# Patient Record
Sex: Male | Born: 1940 | Race: Black or African American | Hispanic: No | Marital: Single | Smoking: Former smoker
Health system: Southern US, Community
[De-identification: ages and names within clinical notes are randomized; demographics above are authoritative.]

## PROBLEM LIST (undated history)

## (undated) DIAGNOSIS — R918 Other nonspecific abnormal finding of lung field: Secondary | ICD-10-CM

## (undated) DIAGNOSIS — K219 Gastro-esophageal reflux disease without esophagitis: Secondary | ICD-10-CM

## (undated) DIAGNOSIS — E785 Hyperlipidemia, unspecified: Secondary | ICD-10-CM

## (undated) DIAGNOSIS — IMO0001 Reserved for inherently not codable concepts without codable children: Secondary | ICD-10-CM

## (undated) DIAGNOSIS — H269 Unspecified cataract: Secondary | ICD-10-CM

## (undated) DIAGNOSIS — J189 Pneumonia, unspecified organism: Secondary | ICD-10-CM

## (undated) DIAGNOSIS — H9190 Unspecified hearing loss, unspecified ear: Secondary | ICD-10-CM

## (undated) DIAGNOSIS — C801 Malignant (primary) neoplasm, unspecified: Secondary | ICD-10-CM

## (undated) DIAGNOSIS — I10 Essential (primary) hypertension: Secondary | ICD-10-CM

## (undated) HISTORY — DX: Hypercalcemia: E83.52

## (undated) HISTORY — PX: CORONARY ARTERY BYPASS GRAFT: SHX141

## (undated) HISTORY — PX: EYE SURGERY: SHX253

## (undated) HISTORY — PX: CARDIAC CATHETERIZATION: SHX172

## (undated) HISTORY — PX: BACK SURGERY: SHX140

---

## 2005-07-27 ENCOUNTER — Ambulatory Visit: Payer: Self-pay | Admitting: *Deleted

## 2006-02-23 ENCOUNTER — Ambulatory Visit: Payer: Self-pay | Admitting: Pediatrics

## 2006-03-24 ENCOUNTER — Ambulatory Visit: Payer: Self-pay | Admitting: Ophthalmology

## 2006-03-30 ENCOUNTER — Ambulatory Visit: Payer: Self-pay | Admitting: Ophthalmology

## 2006-10-21 ENCOUNTER — Ambulatory Visit: Payer: Self-pay | Admitting: Family Medicine

## 2007-03-02 HISTORY — PX: COLONOSCOPY: SHX174

## 2007-04-25 ENCOUNTER — Ambulatory Visit: Payer: Self-pay | Admitting: General Surgery

## 2007-06-26 ENCOUNTER — Ambulatory Visit: Payer: Self-pay | Admitting: General Surgery

## 2007-06-27 ENCOUNTER — Ambulatory Visit: Payer: Self-pay | Admitting: General Surgery

## 2007-11-29 ENCOUNTER — Ambulatory Visit: Payer: Self-pay | Admitting: Family Medicine

## 2008-01-24 ENCOUNTER — Ambulatory Visit: Payer: Self-pay | Admitting: Unknown Physician Specialty

## 2008-02-02 ENCOUNTER — Ambulatory Visit: Payer: Self-pay | Admitting: Unknown Physician Specialty

## 2008-04-23 ENCOUNTER — Ambulatory Visit: Payer: Self-pay | Admitting: Family Medicine

## 2008-07-31 ENCOUNTER — Ambulatory Visit: Payer: Self-pay | Admitting: Family Medicine

## 2009-10-22 ENCOUNTER — Ambulatory Visit: Payer: Self-pay | Admitting: Internal Medicine

## 2010-02-18 ENCOUNTER — Ambulatory Visit: Payer: Self-pay | Admitting: Urology

## 2010-03-11 ENCOUNTER — Ambulatory Visit: Payer: Self-pay | Admitting: Urology

## 2010-05-19 ENCOUNTER — Ambulatory Visit: Payer: Self-pay | Admitting: Family Medicine

## 2010-06-10 ENCOUNTER — Ambulatory Visit: Payer: Self-pay | Admitting: Oncology

## 2010-06-30 ENCOUNTER — Ambulatory Visit: Payer: Self-pay | Admitting: Oncology

## 2011-07-19 ENCOUNTER — Ambulatory Visit: Payer: Self-pay | Admitting: Family Medicine

## 2011-11-22 ENCOUNTER — Encounter: Payer: Self-pay | Admitting: Neurology

## 2011-11-23 ENCOUNTER — Ambulatory Visit: Payer: Self-pay | Admitting: Neurology

## 2011-11-30 ENCOUNTER — Encounter: Payer: Self-pay | Admitting: Neurology

## 2012-03-13 ENCOUNTER — Ambulatory Visit: Payer: Self-pay | Admitting: Oncology

## 2012-03-16 ENCOUNTER — Ambulatory Visit: Payer: Self-pay | Admitting: Specialist

## 2012-03-17 ENCOUNTER — Ambulatory Visit: Payer: Self-pay | Admitting: Oncology

## 2012-03-31 LAB — COMPREHENSIVE METABOLIC PANEL
Alkaline Phosphatase: 96 U/L (ref 50–136)
Anion Gap: 7 (ref 7–16)
BUN: 15 mg/dL (ref 7–18)
Bilirubin,Total: 0.8 mg/dL (ref 0.2–1.0)
Calcium, Total: 9.1 mg/dL (ref 8.5–10.1)
Chloride: 106 mmol/L (ref 98–107)
Co2: 28 mmol/L (ref 21–32)
Creatinine: 1.52 mg/dL — ABNORMAL HIGH (ref 0.60–1.30)
EGFR (African American): 53 — ABNORMAL LOW
Glucose: 59 mg/dL — ABNORMAL LOW (ref 65–99)
Potassium: 4.1 mmol/L (ref 3.5–5.1)
SGOT(AST): 16 U/L (ref 15–37)
SGPT (ALT): 15 U/L (ref 12–78)

## 2012-03-31 LAB — CBC CANCER CENTER
Basophil #: 0 x10 3/mm (ref 0.0–0.1)
Eosinophil %: 2.2 %
HCT: 41.7 % (ref 40.0–52.0)
HGB: 13.3 g/dL (ref 13.0–18.0)
MCH: 23.8 pg — ABNORMAL LOW (ref 26.0–34.0)
MCV: 74 fL — ABNORMAL LOW (ref 80–100)
Monocyte %: 9.8 %
RDW: 14.8 % — ABNORMAL HIGH (ref 11.5–14.5)

## 2012-04-01 ENCOUNTER — Ambulatory Visit: Payer: Self-pay | Admitting: Oncology

## 2012-04-01 LAB — PSA: PSA: <0.1 ng/mL (ref 0.0–4.0)

## 2012-04-04 ENCOUNTER — Ambulatory Visit: Payer: Self-pay | Admitting: Oncology

## 2012-04-07 ENCOUNTER — Ambulatory Visit: Payer: Self-pay | Admitting: Oncology

## 2012-04-07 LAB — CANCER CENTER HEMATOCRIT: HCT: 41.5 % (ref 40.0–52.0)

## 2012-04-07 LAB — APTT: Activated PTT: 30.9 secs (ref 23.6–35.9)

## 2012-04-07 LAB — PROTIME-INR
INR: 1
Prothrombin Time: 13.8 secs (ref 11.5–14.7)

## 2012-04-26 ENCOUNTER — Ambulatory Visit: Payer: Self-pay | Admitting: Internal Medicine

## 2012-04-27 LAB — PATHOLOGY REPORT

## 2012-04-29 ENCOUNTER — Ambulatory Visit: Payer: Self-pay | Admitting: Oncology

## 2012-05-30 ENCOUNTER — Ambulatory Visit: Payer: Self-pay | Admitting: Oncology

## 2012-06-08 ENCOUNTER — Ambulatory Visit: Payer: Self-pay | Admitting: General Surgery

## 2012-06-28 ENCOUNTER — Encounter: Payer: Self-pay | Admitting: *Deleted

## 2012-12-08 ENCOUNTER — Ambulatory Visit: Payer: Self-pay | Admitting: Specialist

## 2013-01-05 ENCOUNTER — Ambulatory Visit: Payer: Self-pay

## 2013-04-24 ENCOUNTER — Ambulatory Visit: Payer: Self-pay | Admitting: Gastroenterology

## 2013-05-22 ENCOUNTER — Ambulatory Visit: Payer: Self-pay | Admitting: Specialist

## 2013-05-24 ENCOUNTER — Ambulatory Visit: Payer: Self-pay | Admitting: Gastroenterology

## 2013-05-28 LAB — PATHOLOGY REPORT

## 2013-10-24 DIAGNOSIS — J8410 Pulmonary fibrosis, unspecified: Secondary | ICD-10-CM | POA: Insufficient documentation

## 2013-10-24 DIAGNOSIS — J984 Other disorders of lung: Secondary | ICD-10-CM | POA: Insufficient documentation

## 2013-10-24 DIAGNOSIS — J449 Chronic obstructive pulmonary disease, unspecified: Secondary | ICD-10-CM | POA: Insufficient documentation

## 2013-11-03 ENCOUNTER — Ambulatory Visit: Payer: Self-pay | Admitting: Family Medicine

## 2013-12-08 ENCOUNTER — Ambulatory Visit: Payer: Self-pay | Admitting: Family Medicine

## 2014-04-03 ENCOUNTER — Ambulatory Visit: Payer: Self-pay | Admitting: Urology

## 2014-07-08 DIAGNOSIS — Z961 Presence of intraocular lens: Secondary | ICD-10-CM | POA: Insufficient documentation

## 2014-07-08 DIAGNOSIS — I712 Thoracic aortic aneurysm, without rupture, unspecified: Secondary | ICD-10-CM | POA: Insufficient documentation

## 2014-07-08 DIAGNOSIS — M5136 Other intervertebral disc degeneration, lumbar region: Secondary | ICD-10-CM | POA: Insufficient documentation

## 2014-07-08 DIAGNOSIS — I2510 Atherosclerotic heart disease of native coronary artery without angina pectoris: Secondary | ICD-10-CM | POA: Insufficient documentation

## 2014-07-08 DIAGNOSIS — E162 Hypoglycemia, unspecified: Secondary | ICD-10-CM | POA: Insufficient documentation

## 2014-07-08 DIAGNOSIS — J189 Pneumonia, unspecified organism: Secondary | ICD-10-CM | POA: Insufficient documentation

## 2014-07-08 DIAGNOSIS — Z720 Tobacco use: Secondary | ICD-10-CM | POA: Insufficient documentation

## 2014-07-08 DIAGNOSIS — R918 Other nonspecific abnormal finding of lung field: Secondary | ICD-10-CM | POA: Insufficient documentation

## 2014-07-08 DIAGNOSIS — I440 Atrioventricular block, first degree: Secondary | ICD-10-CM | POA: Insufficient documentation

## 2014-07-17 DIAGNOSIS — Z8701 Personal history of pneumonia (recurrent): Secondary | ICD-10-CM | POA: Insufficient documentation

## 2014-07-24 ENCOUNTER — Other Ambulatory Visit: Payer: Self-pay | Admitting: Specialist

## 2014-07-24 DIAGNOSIS — R918 Other nonspecific abnormal finding of lung field: Secondary | ICD-10-CM

## 2014-08-01 ENCOUNTER — Ambulatory Visit
Admission: RE | Admit: 2014-08-01 | Discharge: 2014-08-01 | Disposition: A | Discharge status: Home / Self Care | Payer: Medicare Other | Source: Ambulatory Visit | Attending: Specialist | Admitting: Specialist

## 2014-08-01 DIAGNOSIS — R599 Enlarged lymph nodes, unspecified: Secondary | ICD-10-CM | POA: Insufficient documentation

## 2014-08-01 DIAGNOSIS — R918 Other nonspecific abnormal finding of lung field: Secondary | ICD-10-CM | POA: Diagnosis present

## 2014-08-01 DIAGNOSIS — J9811 Atelectasis: Secondary | ICD-10-CM | POA: Diagnosis not present

## 2014-08-01 DIAGNOSIS — N281 Cyst of kidney, acquired: Secondary | ICD-10-CM | POA: Insufficient documentation

## 2014-08-01 DIAGNOSIS — I700 Atherosclerosis of aorta: Secondary | ICD-10-CM | POA: Diagnosis not present

## 2014-08-01 HISTORY — DX: Essential (primary) hypertension: I10

## 2014-08-01 MED ORDER — IOHEXOL 350 MG/ML SOLN
75.0000 mL | Freq: Once | INTRAVENOUS | Status: AC | PRN
  Administered 2014-08-01: 75 mL via INTRAVENOUS

## 2014-08-19 ENCOUNTER — Other Ambulatory Visit: Payer: Self-pay | Admitting: Family Medicine

## 2014-08-19 DIAGNOSIS — N281 Cyst of kidney, acquired: Secondary | ICD-10-CM

## 2014-09-23 ENCOUNTER — Ambulatory Visit
Admission: RE | Admit: 2014-09-23 | Discharge: 2014-09-23 | Disposition: A | Discharge status: Home / Self Care | Payer: Medicare Other | Source: Ambulatory Visit | Attending: Urology | Admitting: Urology

## 2014-09-23 DIAGNOSIS — N281 Cyst of kidney, acquired: Secondary | ICD-10-CM | POA: Diagnosis not present

## 2014-10-08 ENCOUNTER — Other Ambulatory Visit: Payer: Self-pay | Admitting: Specialist

## 2014-10-08 ENCOUNTER — Encounter: Payer: Self-pay | Admitting: Urology

## 2014-10-08 ENCOUNTER — Ambulatory Visit: Payer: Self-pay | Admitting: Urology

## 2014-10-08 DIAGNOSIS — R911 Solitary pulmonary nodule: Secondary | ICD-10-CM

## 2014-10-10 ENCOUNTER — Encounter: Payer: Self-pay | Admitting: Urology

## 2014-10-16 ENCOUNTER — Encounter: Payer: Self-pay | Admitting: Urology

## 2014-10-17 ENCOUNTER — Ambulatory Visit
Admission: EM | Admit: 2014-10-17 | Discharge: 2014-10-17 | Disposition: A | Discharge status: Home / Self Care | Payer: Medicare Other | Attending: Emergency Medicine | Admitting: Emergency Medicine

## 2014-10-17 ENCOUNTER — Ambulatory Visit: Payer: Medicare Other

## 2014-10-17 DIAGNOSIS — K219 Gastro-esophageal reflux disease without esophagitis: Secondary | ICD-10-CM | POA: Diagnosis not present

## 2014-10-17 DIAGNOSIS — B070 Plantar wart: Secondary | ICD-10-CM | POA: Insufficient documentation

## 2014-10-17 DIAGNOSIS — M79671 Pain in right foot: Secondary | ICD-10-CM | POA: Diagnosis present

## 2014-10-17 DIAGNOSIS — I10 Essential (primary) hypertension: Secondary | ICD-10-CM | POA: Diagnosis not present

## 2014-10-17 DIAGNOSIS — Z7982 Long term (current) use of aspirin: Secondary | ICD-10-CM | POA: Diagnosis not present

## 2014-10-17 HISTORY — DX: Other nonspecific abnormal finding of lung field: R91.8

## 2014-10-17 HISTORY — DX: Gastro-esophageal reflux disease without esophagitis: K21.9

## 2014-10-17 HISTORY — DX: Unspecified cataract: H26.9

## 2014-10-17 NOTE — Discharge Instructions (Signed)
Plantar Warts Warts are benign (noncancerous) growths of the outer skin layer. They can occur at any time in life but are most common during childhood and the teen years. Warts can occur on many skin surfaces of the body. When they occur on the underside (sole) of your foot they are called plantar warts. They often emerge in groups with several small warts encircling a larger growth. CAUSES  Human papillomavirus (HPV) is the cause of plantar warts. HPV attacks a break in the skin of the foot. Walking barefoot can lead to exposure to the wart virus. Plantar warts tend to develop over areas of pressure such as the heel and ball of the foot. Plantar warts often grow into the deeper layers of skin. They may spread to other areas of the sole but cannot spread to other areas of the body. SYMPTOMS  You may also notice a growth on the undersurface of your foot. The wart may grow directly into the sole of the foot, or rise above the surface of the skin on the sole of the foot, or both. They are most often flat from pressure. Warts generally do not cause itching but may cause pain in the area of the wart when you put weight on your foot. DIAGNOSIS  Diagnosis is made by physical examination. This means your caregiver discovers it while examining your foot.  TREATMENT  There are many ways to treat plantar warts. However, warts are very tough. Sometimes it is difficult to treat them so that they go away completely and do not grow back. Any treatment must be done regularly to work. If left untreated, most plantar warts will eventually disappear over a period of one to two years. Treatments you can do at home include:  Putting duct tape over the top of the wart (occlusion) has been found to be effective over several months. The duct tape should be removed each night and reapplied until the wart has disappeared.  Placing over-the-counter medications on top of the wart to help kill the wart virus and remove the wart  tissue (salicylic acid, cantharidin, and dichloroacetic acid) are useful. These are called keratolytic agents. These medications make the skin soft and gradually layers will shed away. These compounds are usually placed on the wart each night and then covered with a bandage. They are also available in premedicated bandage form. Avoid surrounding skin when applying these liquids as these medications can burn healthy skin. The treatment may take several months of nightly use to be effective.  Cryotherapy to freeze the wart has recently become available over-the-counter for children 4 years and older. This system makes use of a soft narrow applicator connected to a bottle of compressed cold liquid that is applied directly to the wart. This medication can burn healthy skin and should be used with caution.  As with all over-the-counter medications, read the directions carefully before use. Treatments generally done in your caregiver's office include:  Some aggressive treatments may cause discomfort, discoloration, and scarring of the surrounding skin. The risks and benefits of treatment should be discussed with your caregiver.  Freezing the wart with liquid nitrogen (cryotherapy, see above).  Burning the wart with use of very high heat (cautery).  Injecting medication into the wart.  Surgically removing or laser treatment of the wart.  Your caregiver may refer you to a dermatologist for difficult to treat large-sized warts or large numbers of warts. HOME CARE INSTRUCTIONS   Soak the affected area in warm water. Dry the   area completely when you are done. Remove the top layer of softened skin, then apply the chosen topical medication and reapply a bandage.  Remove the bandage daily and file excess wart tissue (pumice stone works well for this purpose). Repeat the entire process daily or every other day for weeks until the plantar wart disappears.  Several brands of salicylic acid pads are available  as over-the-counter remedies.  Pain can be relieved by wearing a donut bandage. This is a bandage with a hole in it. The bandage is put on with the hole over the wart. This helps take the pressure off the wart and gives pain relief. To help prevent plantar warts:  Wear shoes and socks and change them daily.  Keep feet clean and dry.  Check your feet and your children's feet regularly.  Avoid direct contact with warts on other people.  Have growths or changes on your skin checked by your caregiver. Document Released: 05/08/2003 Document Revised: 07/02/2013 Document Reviewed: 10/16/2008 Exit[employer] Patient Information 2015 [employer]. This information is not intended to replace advice given to you by your health care provider. Make sure you discuss any questions you have with your health care provider.  

## 2014-10-17 NOTE — ED Notes (Signed)
States has had "something in my right foot for over a month. I keep picking at it but there's still something there". Appears to be a pea sized corn bottom of right great toe

## 2014-10-17 NOTE — ED Provider Notes (Signed)
CSN: [id number]     Arrival date & time 10/17/14  1244 History   First MD Initiated Contact with Patient 10/17/14 1313     Chief Complaint  Patient presents with  . Foot Pain   (Consider location/radiation/quality/duration/timing/severity/associated sxs/prior Treatment) HPI 74 yo M presents concerned that something is in his right foot- Sharp sensation for over a month base great toe-  went to PCP and he reports she wouldn't trim it unless she knew there was not " something in there" He has trimmed and seen black dots. Sharp sensation when he steps on it-ball of right foot at right great toe  Past Medical History  Diagnosis Date  . Hypertension   . GERD (gastroesophageal reflux disease)   . Pulmonary nodules/lesions, multiple   . Cataract    Past Surgical History  Procedure Laterality Date  . Back surgery      discectomy ,reports right foot drop since surgery    History reviewed. No pertinent family history. Social History  Substance Use Topics  . Smoking status: Former Research scientist (life sciences)  . Smokeless tobacco: None  . Alcohol Use: No    Review of Systems Review of 10 systems negative for acute change except as referenced in HPI  .  Denies other acute issues at this time Allergies  Review of patient's allergies indicates no known allergies.  Home Medications   Prior to Admission medications   Medication Sig Start Date End Date Taking? Authorizing Provider  aspirin EC 81 MG tablet Take 81 mg by mouth daily.   Yes Historical Provider, MD  famotidine (PEPCID) 20 MG tablet Take 20 mg by mouth 2 (two) times daily.   Yes Historical Provider, MD   BP 112/95 mmHg  Pulse 69  Temp(Src) 97.1 F (36.2 C) (Oral)  Resp 16  Ht 6' (1.829 m)  Wt 184 lb (83.462 kg)  BMI 24.95 kg/m2  SpO2 97% Physical Exam   Constitutional -alert and oriented,well appearing and in no acute distress Head-atraumatic, normocephalic Eyes- conjunctiva normal, EOMI ,conjugate gaze Ears- hard of hearing,canals  clear, TMs neg Nose- no congestion or rhinorrhea Mouth/throat- mucous membranes moist , Neck- supple  CV- regular rate, grossly normal heart sounds,  Resp-no distress, normal respiratory effort, GI-no distention GU- deferred MSK- non tender, normal ROM, all extremities, ambulatory, self-care- tenderness sole base  right great toe , callous vs wart Neuro- normal speech and language, no gross focal neurological deficit appreciated, no gait instability, Skin-warm,dry ,intact; no rash noted Psych-mood and affect grossly normal; speech and behavior grossly normal  ED Course  Procedures (including critical care time) Labs Review Labs Reviewed - No data to display  Imaging Review Dg Toe Great Right  10/17/2014   CLINICAL DATA:  The patient feels as if something is stuck in his foot the first MTP joint. No known injury. Initial encounter.  EXAM: RIGHT GREAT TOE  COMPARISON:  None.  FINDINGS: There appears to be mild soft tissue swelling about the medial margin of the first metatarsal head. No radiopaque foreign body or soft tissue gas collection is identified. Image bones and joints appear normal.  IMPRESSION: Mild soft tissue swelling about the first metatarsal head without underlying foreign body or bony abnormality.   Electronically Signed   By: Inge Rise M.D.   On: 10/17/2014 14:02   Suspected plantar wart was steri-prepped then shaved with scalpel. No anesthesia needed.  Classic central core revealed and de-bulked. Neosporin and band-aid dressing. Patient pleased with comfort post procedure. Self-care taught  Can seek podiatry care whenever preferred. Info given    MDM   1. Plantar wart, right foot    Plan: 1. Test/x-ray results and diagnosis reviewed with patient 2. rx as per orders; risks, benefits, potential side effects reviewed with patient 3. Recommend supportive treatment with warm soaks , safety razor shave trims after showers/baths-patient taught.     Duct tape  technique and salacyclic acid pad therapy reviewed as options-hand out given 4. F/u prn if symptoms worsen or don't improve, podiatry consult if desired    Jan Fireman, PA-C 10/18/14 1643

## 2014-10-18 ENCOUNTER — Encounter: Payer: Self-pay | Admitting: Physician Assistant

## 2014-11-05 ENCOUNTER — Ambulatory Visit
Admission: RE | Admit: 2014-11-05 | Discharge: 2014-11-05 | Disposition: A | Discharge status: Home / Self Care | Payer: Medicare Other | Source: Ambulatory Visit | Attending: Specialist | Admitting: Specialist

## 2014-11-05 DIAGNOSIS — N281 Cyst of kidney, acquired: Secondary | ICD-10-CM | POA: Diagnosis not present

## 2014-11-05 DIAGNOSIS — J439 Emphysema, unspecified: Secondary | ICD-10-CM | POA: Diagnosis not present

## 2014-11-05 DIAGNOSIS — R911 Solitary pulmonary nodule: Secondary | ICD-10-CM | POA: Insufficient documentation

## 2014-11-05 DIAGNOSIS — I700 Atherosclerosis of aorta: Secondary | ICD-10-CM | POA: Diagnosis not present

## 2014-11-05 MED ORDER — IOHEXOL 300 MG/ML  SOLN
75.0000 mL | Freq: Once | INTRAMUSCULAR | Status: AC | PRN
  Administered 2014-11-05: 60 mL via INTRAVENOUS

## 2015-03-06 ENCOUNTER — Ambulatory Visit
Admission: EM | Admit: 2015-03-06 | Discharge: 2015-03-06 | Disposition: A | Discharge status: Home / Self Care | Payer: Medicare Other | Attending: Family Medicine | Admitting: Family Medicine

## 2015-03-06 DIAGNOSIS — H1012 Acute atopic conjunctivitis, left eye: Secondary | ICD-10-CM

## 2015-03-06 DIAGNOSIS — I714 Abdominal aortic aneurysm, without rupture, unspecified: Secondary | ICD-10-CM | POA: Insufficient documentation

## 2015-03-06 DIAGNOSIS — I10 Essential (primary) hypertension: Secondary | ICD-10-CM | POA: Insufficient documentation

## 2015-03-06 DIAGNOSIS — I6523 Occlusion and stenosis of bilateral carotid arteries: Secondary | ICD-10-CM | POA: Insufficient documentation

## 2015-03-06 DIAGNOSIS — E785 Hyperlipidemia, unspecified: Secondary | ICD-10-CM | POA: Insufficient documentation

## 2015-03-06 DIAGNOSIS — H547 Unspecified visual loss: Secondary | ICD-10-CM

## 2015-03-06 NOTE — ED Notes (Signed)
Went to [facility] ER 2 weeks ago with hypertensive crisis. The next day, left eye began itching. Went to [facility] ER Monday and given Polymixin Ophthamic. States left eye "much worse and has a film over my eye"

## 2015-03-06 NOTE — ED Provider Notes (Signed)
CSN: [id number]     Arrival date & time 03/06/15  1013 History   First MD Initiated Contact with Patient 03/06/15 1214     Chief Complaint  Patient presents with  . Eye Problem   (Consider location/radiation/quality/duration/timing/severity/associated sxs/prior Treatment) HPI  75 year old male who presents with left eye redness and drainage foreign body sensation but no photophobia. He went to [facility] about 2 weeks ago for hypertensive crisis. He stated that his left eye began to itch. He went to [facility] emergency room on Monday prior prescribe polymyxin B ophthalmic for pinkeye. He has been using it as prescribed but has only become worse. Now as feels as if there was something in his eye is I a stinging matted shut in the morning and has a burning sensation. Visual acuity today shows the left eye to be 20/200. It appears to be slightly swollen the conjunctiva is very edematous and erythematous. The patient does admit that he has been rubbing his eye frequently and may be having a reaction to the polymyxin.   Past Medical History  Diagnosis Date  . Hypertension   . GERD (gastroesophageal reflux disease)   . Pulmonary nodules/lesions, multiple   . Cataract    Past Surgical History  Procedure Laterality Date  . Back surgery      discectomy ,reports right foot drop since surgery    History reviewed. No pertinent family history. Social History  Substance Use Topics  . Smoking status: Former Research scientist (life sciences)  . Smokeless tobacco: None  . Alcohol Use: No    Review of Systems  Eyes: Positive for pain, discharge, redness, itching and visual disturbance.  All other systems reviewed and are negative.   Allergies  Review of patient's allergies indicates no known allergies.  Home Medications   Prior to Admission medications   Medication Sig Start Date End Date Taking? Authorizing Provider  aspirin EC 81 MG tablet Take 81 mg by mouth daily.   Yes Historical Provider, MD  atorvastatin  (LIPITOR) 20 MG tablet Take 20 mg by mouth daily.   Yes Historical Provider, MD  furosemide (LASIX) 20 MG tablet Take 20 mg by mouth.   Yes Historical Provider, MD  losartan (COZAAR) 50 MG tablet Take 50 mg by mouth daily.   Yes Historical Provider, MD  omeprazole (PRILOSEC) 20 MG capsule Take 20 mg by mouth daily.   Yes Historical Provider, MD  tamsulosin (FLOMAX) 0.4 MG CAPS capsule Take 0.4 mg by mouth.   Yes Historical Provider, MD  vitamin B-12 (CYANOCOBALAMIN) 500 MCG tablet Take 500 mcg by mouth daily.   Yes Historical Provider, MD  famotidine (PEPCID) 20 MG tablet Take 20 mg by mouth 2 (two) times daily.    Historical Provider, MD   Meds Ordered and Administered this Visit  Medications - No data to display  BP 127/98 mmHg  Pulse 94  Temp(Src) 96.8 F (36 C) (Tympanic)  Resp 17  Ht 6' (1.829 m)  Wt 184 lb (83.462 kg)  BMI 24.95 kg/m2  SpO2 100% No data found.   Physical Exam  Constitutional: He is oriented to person, place, and time. He appears well-developed and well-nourished. No distress.  HENT:  Head: Normocephalic and atraumatic.  Eyes: Pupils are equal, round, and reactive to light.  Examination of the eyes shows a thick clear discharge throughout the eye that they continues to run about the exam. Is no matting of the lashes but they are very wet. The pupil is equal and reactive. EOMs are  intact. Conjunctiva is very boggy and erythematous. He does not exhibit any photophobia. The left lid was everted and no foreign body was seen. Fluorescein staining was deferred to ophthalmology where the patient will be sent.  Neck: Normal range of motion. Neck supple.  Musculoskeletal: Normal range of motion. He exhibits no edema or tenderness.  Neurological: He is alert and oriented to person, place, and time.  Skin: Skin is warm and dry. He is not diaphoretic.  Psychiatric: He has a normal mood and affect. His behavior is normal. Judgment and thought content normal.  Nursing note  and vitals reviewed.   ED Course  Procedures (including critical care time)  Labs Review Labs Reviewed - No data to display  Imaging Review No results found.   Visual Acuity Review  Right Eye Distance: 20/50 Left Eye Distance: 20/200 Bilateral Distance:    Right Eye Near:   Left Eye Near:    Bilateral Near:         MDM   1. Allergic conjunctivitis, left eye   2. Reduced visual acuity    Refer to [facility] because of his decreased visual acuity in that eye and the possibility of an allergic reaction to the polymyxin. We have set up an appointment for him today at  2:45 at [facility].    Lorin Picket, PA-C 03/06/15 1248

## 2015-04-17 ENCOUNTER — Encounter: Payer: Self-pay | Admitting: *Deleted

## 2015-04-22 ENCOUNTER — Ambulatory Visit
Admission: RE | Admit: 2015-04-22 | Discharge: 2015-04-22 | Disposition: A | Discharge status: Home / Self Care | Payer: Medicare Other | Source: Ambulatory Visit | Attending: Ophthalmology | Admitting: Ophthalmology

## 2015-04-22 ENCOUNTER — Encounter: Payer: Self-pay | Admitting: *Deleted

## 2015-04-22 ENCOUNTER — Ambulatory Visit: Payer: Medicare Other | Admitting: Anesthesiology

## 2015-04-22 ENCOUNTER — Encounter
Admission: RE | Disposition: A | Discharge status: Home / Self Care | Payer: Self-pay | Source: Ambulatory Visit | Attending: Ophthalmology

## 2015-04-22 DIAGNOSIS — H2512 Age-related nuclear cataract, left eye: Secondary | ICD-10-CM | POA: Diagnosis present

## 2015-04-22 DIAGNOSIS — Z87891 Personal history of nicotine dependence: Secondary | ICD-10-CM | POA: Diagnosis not present

## 2015-04-22 DIAGNOSIS — H9190 Unspecified hearing loss, unspecified ear: Secondary | ICD-10-CM | POA: Insufficient documentation

## 2015-04-22 DIAGNOSIS — Z8546 Personal history of malignant neoplasm of prostate: Secondary | ICD-10-CM | POA: Diagnosis not present

## 2015-04-22 DIAGNOSIS — I10 Essential (primary) hypertension: Secondary | ICD-10-CM | POA: Diagnosis not present

## 2015-04-22 DIAGNOSIS — R0602 Shortness of breath: Secondary | ICD-10-CM | POA: Insufficient documentation

## 2015-04-22 DIAGNOSIS — K219 Gastro-esophageal reflux disease without esophagitis: Secondary | ICD-10-CM | POA: Insufficient documentation

## 2015-04-22 DIAGNOSIS — E7800 Pure hypercholesterolemia, unspecified: Secondary | ICD-10-CM | POA: Insufficient documentation

## 2015-04-22 DIAGNOSIS — Z79899 Other long term (current) drug therapy: Secondary | ICD-10-CM | POA: Diagnosis not present

## 2015-04-22 DIAGNOSIS — Z7982 Long term (current) use of aspirin: Secondary | ICD-10-CM | POA: Insufficient documentation

## 2015-04-22 HISTORY — DX: Reserved for inherently not codable concepts without codable children: IMO0001

## 2015-04-22 HISTORY — DX: Hyperlipidemia, unspecified: E78.5

## 2015-04-22 HISTORY — DX: Pneumonia, unspecified organism: J18.9

## 2015-04-22 HISTORY — DX: Malignant (primary) neoplasm, unspecified: C80.1

## 2015-04-22 HISTORY — DX: Unspecified hearing loss, unspecified ear: H91.90

## 2015-04-22 HISTORY — PX: CATARACT EXTRACTION W/PHACO: SHX586

## 2015-04-22 SURGERY — PHACOEMULSIFICATION, CATARACT, WITH IOL INSERTION
Anesthesia: Monitor Anesthesia Care | Site: Eye | Laterality: Left | Wound class: Clean

## 2015-04-22 MED ORDER — TETRACAINE HCL 0.5 % OP SOLN
OPHTHALMIC | Status: AC
  Administered 2015-04-22: 1 [drp] via OPHTHALMIC
  Filled 2015-04-22: qty 2

## 2015-04-22 MED ORDER — CEFUROXIME OPHTHALMIC INJECTION 1 MG/0.1 ML
INJECTION | OPHTHALMIC | Status: DC | PRN
  Administered 2015-04-22: .1 mL via INTRACAMERAL

## 2015-04-22 MED ORDER — TRYPAN BLUE 0.06 % OP SOLN
OPHTHALMIC | Status: AC
  Filled 2015-04-22: qty 0.5

## 2015-04-22 MED ORDER — EPINEPHRINE HCL 1 MG/ML IJ SOLN
INTRAMUSCULAR | Status: AC
  Filled 2015-04-22: qty 2

## 2015-04-22 MED ORDER — ARMC OPHTHALMIC DILATING GEL
OPHTHALMIC | Status: AC
  Administered 2015-04-22: 1 via OPHTHALMIC
  Filled 2015-04-22: qty 0.25

## 2015-04-22 MED ORDER — LIDOCAINE HCL (PF) 1 % IJ SOLN
INTRAMUSCULAR | Status: DC | PRN
Start: 2015-04-22 — End: 2015-04-22
  Administered 2015-04-22: .5 mL via OPHTHALMIC

## 2015-04-22 MED ORDER — POVIDONE-IODINE 5 % OP SOLN
1.0000 "application " | Freq: Once | OPHTHALMIC | Status: AC
  Administered 2015-04-22: 1 via OPHTHALMIC

## 2015-04-22 MED ORDER — MOXIFLOXACIN HCL 0.5 % OP SOLN
OPHTHALMIC | Status: DC | PRN
  Administered 2015-04-22: 1 [drp] via OPHTHALMIC

## 2015-04-22 MED ORDER — EPINEPHRINE HCL 1 MG/ML IJ SOLN
INTRAOCULAR | Status: DC | PRN
  Administered 2015-04-22: 1 mL via OPHTHALMIC

## 2015-04-22 MED ORDER — TETRACAINE HCL 0.5 % OP SOLN
1.0000 [drp] | Freq: Once | OPHTHALMIC | Status: AC
  Administered 2015-04-22: 1 [drp] via OPHTHALMIC

## 2015-04-22 MED ORDER — POVIDONE-IODINE 5 % OP SOLN
OPHTHALMIC | Status: AC
  Administered 2015-04-22: 1 via OPHTHALMIC
  Filled 2015-04-22: qty 30

## 2015-04-22 MED ORDER — NA CHONDROIT SULF-NA HYALURON 40-17 MG/ML IO SOLN
INTRAOCULAR | Status: AC
  Filled 2015-04-22: qty 1

## 2015-04-22 MED ORDER — ARMC OPHTHALMIC DILATING GEL
1.0000 "application " | OPHTHALMIC | Status: AC
  Administered 2015-04-22 (×2): 1 via OPHTHALMIC

## 2015-04-22 MED ORDER — MOXIFLOXACIN HCL 0.5 % OP SOLN
OPHTHALMIC | Status: AC
  Filled 2015-04-22: qty 3

## 2015-04-22 MED ORDER — FENTANYL CITRATE (PF) 250 MCG/5ML IJ SOLN
INTRAMUSCULAR | Status: DC | PRN
  Administered 2015-04-22: 50 ug via INTRAVENOUS

## 2015-04-22 MED ORDER — SODIUM CHLORIDE 0.9 % IV SOLN
INTRAVENOUS | Status: DC
  Administered 2015-04-22: 12:00:00 via INTRAVENOUS

## 2015-04-22 MED ORDER — LIDOCAINE HCL (PF) 4 % IJ SOLN
INTRAMUSCULAR | Status: AC
  Filled 2015-04-22: qty 5

## 2015-04-22 MED ORDER — CEFUROXIME OPHTHALMIC INJECTION 1 MG/0.1 ML
INJECTION | OPHTHALMIC | Status: AC
  Filled 2015-04-22: qty 0.1

## 2015-04-22 MED ORDER — CARBACHOL 0.01 % IO SOLN
INTRAOCULAR | Status: DC | PRN
Start: 2015-04-22 — End: 2015-04-22
  Administered 2015-04-22: .5 mL via INTRAOCULAR

## 2015-04-22 MED ORDER — NA CHONDROIT SULF-NA HYALURON 40-17 MG/ML IO SOLN
INTRAOCULAR | Status: DC | PRN
  Administered 2015-04-22: 1 mL via INTRAOCULAR

## 2015-04-22 MED ORDER — MOXIFLOXACIN HCL 0.5 % OP SOLN: 1.0000 [drp] | OPHTHALMIC | Status: DC | PRN

## 2015-04-22 SURGICAL SUPPLY — 22 items
CANNULA ANT/CHMB 27GA (MISCELLANEOUS) ×3 IMPLANT
CUP MEDICINE 2OZ PLAST GRAD ST (MISCELLANEOUS) ×3 IMPLANT
GLOVE BIO SURGEON STRL SZ8 (GLOVE) ×3 IMPLANT
GLOVE BIOGEL M 6.5 STRL (GLOVE) ×3 IMPLANT
GLOVE SURG LX 8.0 MICRO (GLOVE) ×2
GLOVE SURG LX STRL 8.0 MICRO (GLOVE) ×1 IMPLANT
GOWN STRL REUS W/ TWL LRG LVL3 (GOWN DISPOSABLE) ×2 IMPLANT
GOWN STRL REUS W/TWL LRG LVL3 (GOWN DISPOSABLE) ×4
LENS IOL TECNIS 20.0 (Intraocular Lens) ×3 IMPLANT
LENS IOL TECNIS MONO 1P 20.0 (Intraocular Lens) ×1 IMPLANT
PACK CATARACT (MISCELLANEOUS) ×3 IMPLANT
PACK CATARACT BRASINGTON LX (MISCELLANEOUS) ×3 IMPLANT
PACK EYE AFTER SURG (MISCELLANEOUS) ×3 IMPLANT
SOL BSS BAG (MISCELLANEOUS) ×3
SOL PREP PVP 2OZ (MISCELLANEOUS) ×3
SOLUTION BSS BAG (MISCELLANEOUS) ×1 IMPLANT
SOLUTION PREP PVP 2OZ (MISCELLANEOUS) ×1 IMPLANT
SYR 3ML LL SCALE MARK (SYRINGE) ×3 IMPLANT
SYR 5ML LL (SYRINGE) ×3 IMPLANT
SYR TB 1ML 27GX1/2 LL (SYRINGE) ×3 IMPLANT
WATER STERILE IRR 1000ML POUR (IV SOLUTION) ×3 IMPLANT
WIPE NON LINTING 3.25X3.25 (MISCELLANEOUS) ×3 IMPLANT

## 2015-04-22 NOTE — Anesthesia Preprocedure Evaluation (Signed)
Anesthesia Evaluation  Patient identified by MRN, date of birth, ID band Patient awake    Reviewed: Allergy & Precautions, H&P , NPO status , Patient's Chart, lab work & pertinent test results, reviewed documented beta blocker date and time   History of Anesthesia Complications Negative for: history of anesthetic complications  Airway Mallampati: I  TM Distance: >3 FB Neck ROM: full    Dental no notable dental hx. (+) Upper Dentures, Missing, Poor Dentition   Pulmonary shortness of breath and with exertion, neg sleep apnea, neg COPD, neg recent URI, former smoker,    Pulmonary exam normal breath sounds clear to auscultation       Cardiovascular Exercise Tolerance: Good hypertension, On Medications (-) angina(-) CAD, (-) Past MI, (-) Cardiac Stents and (-) CABG Normal cardiovascular exam(-) dysrhythmias (-) Valvular Problems/Murmurs Rhythm:regular Rate:Normal     Neuro/Psych negative neurological ROS  negative psych ROS   GI/Hepatic Neg liver ROS, GERD  Medicated,  Endo/Other  negative endocrine ROS  Renal/GU negative Renal ROS  negative genitourinary   Musculoskeletal   Abdominal   Peds  Hematology negative hematology ROS (+)   Anesthesia Other Findings Past Medical History:   Hypertension                                                 GERD (gastroesophageal reflux disease)                       Pulmonary nodules/lesions, multiple                          Cataract                                                     Shortness of breath dyspnea                                  HOH (hard of hearing)                                        Elevated lipids                                              Cancer (HCC)                                                   Comment:prostate   Pneumonia                                                    Reproductive/Obstetrics negative OB ROS  Anesthesia Physical Anesthesia Plan  ASA: II  Anesthesia Plan: MAC   Post-op Pain Management:    Induction:   Airway Management Planned:   Additional Equipment:   Intra-op Plan:   Post-operative Plan:   Informed Consent: I have reviewed the patients History and Physical, chart, labs and discussed the procedure including the risks, benefits and alternatives for the proposed anesthesia with the patient or authorized representative who has indicated his/her understanding and acceptance.   Dental Advisory Given  Plan Discussed with: Anesthesiologist, CRNA and Surgeon  Anesthesia Plan Comments:         Anesthesia Quick Evaluation

## 2015-04-22 NOTE — Anesthesia Postprocedure Evaluation (Signed)
Anesthesia Post Note  Patient: Billy Ortiz  Procedure(s) Performed: Procedure(s) (LRB): CATARACT EXTRACTION PHACO AND INTRAOCULAR LENS PLACEMENT (IOC) (Left)  Patient location during evaluation: Short Stay Anesthesia Type: MAC Level of consciousness: awake and awake and alert Pain management: pain level controlled Vital Signs Assessment: post-procedure vital signs reviewed and stable Respiratory status: spontaneous breathing and nonlabored ventilation Cardiovascular status: blood pressure returned to baseline and stable Postop Assessment: no headache, no backache and no signs of nausea or vomiting    Last Vitals:  Filed Vitals:   04/22/15 1137 04/22/15 1323  BP: 151/83   Pulse: 57 48  Temp: 36.6 C 36.4 C  Resp: 16 16    Last Pain: There were no vitals filed for this visit.               Delaney Meigs

## 2015-04-22 NOTE — Op Note (Signed)
PREOPERATIVE DIAGNOSIS:  Nuclear sclerotic cataract of the left eye.   POSTOPERATIVE DIAGNOSIS:  nuclear sclerotic cataract left eye   OPERATIVE PROCEDURE:  Procedure(s): CATARACT EXTRACTION PHACO AND INTRAOCULAR LENS PLACEMENT (IOC)   SURGEON:  Birder Robson, MD.   ANESTHESIA:   Anesthesiologist: Martha Clan, MD CRNA: Delaney Meigs, CRNA  1.      Managed anesthesia care. 2.      Topical tetracaine drops followed by 2% Xylocaine jelly applied in the preoperative holding area.       3.   0.2 ml of epi-Shugarcaine was  placed in the anterior chamber following the paracentesis.    COMPLICATIONS:  None.   TECHNIQUE:   Stop and chop   DESCRIPTION OF PROCEDURE:  The patient was examined and consented in the preoperative holding area where the aforementioned topical anesthesia was applied to the left eye and then brought back to the Operating Room where the left eye was prepped and draped in the usual sterile ophthalmic fashion and a lid speculum was placed. A paracentesis was created with the side port blade and the anterior chamber was filled with viscoelastic. A near clear corneal incision was performed with the steel keratome. A continuous curvilinear capsulorrhexis was performed with a cystotome followed by the capsulorrhexis forceps. Hydrodissection and hydrodelineation were carried out with BSS on a blunt cannula. The lens was removed in a stop and chop  technique and the remaining cortical material was removed with the irrigation-aspiration handpiece. The capsular bag was inflated with viscoelastic and the Technis [id number] lens was placed in the capsular bag without complication. The remaining viscoelastic was removed from the eye with the irrigation-aspiration handpiece. The wounds were hydrated. The anterior chamber was flushed with Miostat and the eye was inflated to physiologic pressure. 0.1 mL of cefuroxime concentration 10 mg/mL was placed in the anterior chamber. The wounds were found  to be water tight. The eye was dressed with Vigamox. The patient was given protective glasses to wear throughout the day and a shield with which to sleep tonight. The patient was also given drops with which to begin a drop regimen today and will follow-up with me in one day.  Implant Name Type Inv. Item Serial No. Manufacturer Lot No. LRB No. Used  LENS IOL TECNIS 20.0 - [id number] Intraocular Lens LENS IOL TECNIS 20.0 [id number]   Left 1   Procedure(s) with comments: CATARACT EXTRACTION PHACO AND INTRAOCULAR LENS PLACEMENT (IOC) (Left) - [place]     1:17.8 AP%   25.7 CDE    20.03 fluid pack lot # [id number]  Electronically signed: Anissia Wessells [place]OUIS 04/22/2015 1:20 PM

## 2015-04-22 NOTE — Transfer of Care (Signed)
Immediate Anesthesia Transfer of Care Note  Patient: Billy Ortiz  Procedure(s) Performed: Procedure(s) with comments: CATARACT EXTRACTION PHACO AND INTRAOCULAR LENS PLACEMENT (IOC) (Left) - [place]     1:17.8 AP%   25.7 CDE    20.03 fluid pack lot # [id number]  Patient Location: Short Stay  Anesthesia Type:MAC  Level of Consciousness: awake, alert  and oriented  Airway & Oxygen Therapy: Patient Spontanous Breathing and Patient connected to nasal cannula oxygen  Post-op Assessment: Report given to RN and Post -op Vital signs reviewed and stable  Post vital signs: Reviewed and stable  Last Vitals: 1324 100% sat 47 hr 171/78 18 resp  Filed Vitals:   04/17/15 1603 04/22/15 1137  BP: 114/72 151/83  Pulse: 68 57  Temp:  36.6 C  Resp:  16    Complications: No apparent anesthesia complications

## 2015-04-22 NOTE — H&P (Signed)
All labs reviewed. Abnormal studies sent to patients PCP when indicated.  Previous H&P reviewed, patient examined, there are NO CHANGES.  Billy Ortiz LOUIS2/21/201712:52 PM

## 2015-04-22 NOTE — Discharge Instructions (Signed)
AMBULATORY SURGERY  °DISCHARGE INSTRUCTIONS ° ° °1) The drugs that you were given will stay in your system until tomorrow so for the next 24 hours you should not: ° °A) Drive an automobile °B) Make any legal decisions °C) Drink any alcoholic beverage ° ° °2) You may resume regular meals tomorrow.  Today it is better to start with liquids and gradually work up to solid foods. ° °You may eat anything you prefer, but it is better to start with liquids, then soup and crackers, and gradually work up to solid foods. ° ° °3) Please notify your doctor immediately if you have any unusual bleeding, trouble breathing, redness and pain at the surgery site, drainage, fever, or pain not relieved by medication. ° ° ° °4) Additional Instructions: ° ° ° °Eye Surgery Discharge Instructions ° °Expect mild scratchy sensation or mild soreness. °DO NOT RUB YOUR EYE! ° °The day of surgery: °• Minimal physical activity, but bed rest is not required °• No reading, computer work, or close hand work °• No bending, lifting, or straining. °• May watch TV ° °For 24 hours: °• No driving, legal decisions, or alcoholic beverages °• Safety precautions °• Eat anything you prefer: It is better to start with liquids, then soup then solid foods. °• _____ Eye patch should be worn until postoperative exam tomorrow. °• ____ Solar shield eyeglasses should be worn for comfort in the sunlight/patch while sleeping ° °Resume all regular medications including aspirin or Coumadin if these were discontinued prior to surgery. °You may shower, bathe, shave, or wash your hair. °Tylenol may be taken for mild discomfort. ° °Call your doctor if you experience significant pain, nausea, or vomiting, fever > 101 or other signs of infection. [phone] or [phone] °Specific instructions: ° ° ° ° ° °Please contact your physician with any problems or Same Day Surgery at [phone], Monday through Friday 6 am to 4 pm, or [place] [facility] number at  [phone]. °

## 2016-01-25 ENCOUNTER — Encounter: Payer: Self-pay | Admitting: Medical Oncology

## 2016-01-25 ENCOUNTER — Emergency Department
Admission: EM | Admit: 2016-01-25 | Discharge: 2016-01-25 | Disposition: A | Discharge status: Home / Self Care | Payer: Medicare Other | Attending: Emergency Medicine | Admitting: Emergency Medicine

## 2016-01-25 ENCOUNTER — Emergency Department: Payer: Medicare Other

## 2016-01-25 DIAGNOSIS — Z7982 Long term (current) use of aspirin: Secondary | ICD-10-CM | POA: Diagnosis not present

## 2016-01-25 DIAGNOSIS — Z8546 Personal history of malignant neoplasm of prostate: Secondary | ICD-10-CM | POA: Diagnosis not present

## 2016-01-25 DIAGNOSIS — M79604 Pain in right leg: Secondary | ICD-10-CM | POA: Insufficient documentation

## 2016-01-25 DIAGNOSIS — Z79899 Other long term (current) drug therapy: Secondary | ICD-10-CM | POA: Insufficient documentation

## 2016-01-25 DIAGNOSIS — Z87891 Personal history of nicotine dependence: Secondary | ICD-10-CM | POA: Diagnosis not present

## 2016-01-25 DIAGNOSIS — I10 Essential (primary) hypertension: Secondary | ICD-10-CM | POA: Diagnosis not present

## 2016-01-25 MED ORDER — TRAMADOL HCL 50 MG PO TABS: 50.0000 mg | ORAL_TABLET | Freq: Four times a day (QID) | ORAL | 0 refills | Status: DC | PRN

## 2016-01-25 NOTE — ED Triage Notes (Addendum)
Pt reports he fell 1 week ago and since has been having rt hip/leg pain. Per pt seen in [place] and x-rays were negative. Pt is ambulatory.

## 2016-01-25 NOTE — ED Provider Notes (Signed)
[facility] Emergency Department Provider Note  ____________________________________________  Time seen: Approximately 10:18 AM  I have reviewed the triage vital signs and the nursing notes.   HISTORY  Chief Complaint Leg Pain and Hip Pain    HPI Billy Ortiz is a 75 y.o. male , NAD, presents to the emergency department with a one-week history of right groin and inguinal pain. Patient was seen at the [facility] emergency Department in [facility] approximately 2 weeks ago for the same pain. Had negative right hip and CT of the abdomen and pelvis completed. Since that time patient has seen his primary care provider at the [place] [facility] here in [employer] in follow-up. Was given a pain medication in which he is uncertain of the name. He states that the pain medication does not help and he continues to have pain in the groin. Denies any dysuria, hematuria, urinary urgency, hesitancy or increased urine frequency. Denies any rectal pain or sensation of sitting on a tennis ball. Has no abdominal pain, nausea, vomiting, diarrhea or constipation. Has had no chest pain, shortness of breath. States he feels a lump in his right inguinal region in which she felt was an enlarged lymph node. Has had no fevers, chills, body aches, night sweats nor unintended weight loss. Denies any redness, abnormal warmth or swelling of his lower extremities but notes he has chronic numbness in the feet that is unchanged.   Past Medical History:  Diagnosis Date  . Cancer [facility])    prostate  . Cataract   . Elevated lipids   . GERD (gastroesophageal reflux disease)   . HOH (hard of hearing)   . Hypertension   . Pneumonia   . Pulmonary nodules/lesions, multiple   . Shortness of breath dyspnea     There are no active problems to display for this patient.   Past Surgical History:  Procedure Laterality Date  . BACK SURGERY     discectomy ,reports right foot drop since surgery   .  CATARACT EXTRACTION W/PHACO Left 04/22/2015   Procedure: CATARACT EXTRACTION PHACO AND INTRAOCULAR LENS PLACEMENT (IOC);  Surgeon: Birder Robson, MD;  Location: [facility] ORS;  Service: Ophthalmology;  Laterality: Left;  [place]     1:17.8 AP%   25.7 CDE    20.03 fluid pack lot # [id number]    Prior to Admission medications   Medication Sig Start Date End Date Taking? Authorizing Provider  aspirin EC 81 MG tablet Take 81 mg by mouth daily.    Historical Provider, MD  atorvastatin (LIPITOR) 20 MG tablet Take 20 mg by mouth daily.    Historical Provider, MD  furosemide (LASIX) 20 MG tablet Take 20 mg by mouth.    Historical Provider, MD  losartan (COZAAR) 50 MG tablet Take 50 mg by mouth daily.    Historical Provider, MD  omeprazole (PRILOSEC) 20 MG capsule Take 20 mg by mouth daily.    Historical Provider, MD  tamsulosin (FLOMAX) 0.4 MG CAPS capsule Take 0.4 mg by mouth.    Historical Provider, MD  traMADol (ULTRAM) 50 MG tablet Take 1 tablet (50 mg total) by mouth every 6 (six) hours as needed. 01/25/16   Rosezetta Balderston L Moe Brier, PA-C  vitamin B-12 (CYANOCOBALAMIN) 500 MCG tablet Take 500 mcg by mouth daily.    Historical Provider, MD    Allergies Patient has no known allergies.  No family history on file.  Social History Social History  Substance Use Topics  . Smoking status: Former Research scientist (life sciences)  .  Smokeless tobacco: Not on file  . Alcohol use No     Review of Systems  Constitutional: No fever/chills, Night sweats, unintentional weight loss. Eyes: No visual changes. Cardiovascular: No chest pain. Respiratory: No shortness of breath. No wheezing.  Gastrointestinal: No abdominal pain.  No nausea, vomiting.  No diarrhea.  No constipation. Genitourinary: Negative for dysuria. No hematuria. No urinary hesitancy, urgency or increased frequency. No sensation of sitting on a tennis ball. Musculoskeletal: Positive right hip and inguinal pain radiating into right thigh. Negative for back, neck pain.  Skin:  Negative for rash, redness, swelling, skin sores. Neurological: Negative for headaches, focal weakness or numbness. No saddle paresthesias or loss of bowel or bladder control. 10-point ROS otherwise negative.  ____________________________________________   PHYSICAL EXAM:  VITAL SIGNS: ED Triage Vitals  Enc Vitals Group     BP 01/25/16 0952 (!) 182/85     Pulse Rate 01/25/16 0952 68     Resp 01/25/16 0952 17     Temp 01/25/16 0952 98.1 F (36.7 C)     Temp Source 01/25/16 0952 Oral     SpO2 01/25/16 0952 97 %     Weight 01/25/16 0950 185 lb (83.9 kg)     Height 01/25/16 0950 6' (1.829 m)     Head Circumference --      Peak Flow --      Pain Score 01/25/16 0950 7     Pain Loc --      Pain Edu? --      Excl. in [place]? --      Constitutional: Alert and oriented. Well appearing and in no acute distress. Eyes: Conjunctivae are normal Without icterus, injection or discharge  Head: Atraumatic. Neck: Supple with full range of motion. Hematological/Lymphatic/Immunilogical: No cervical, Inguinal lymphadenopathy. Cardiovascular: Good peripheral circulation with 2+ pulses noted in bilateral lower extremities. Respiratory: Normal respiratory effort without tachypnea or retractions.  Gastrointestinal: Soft and nontender without distention or guarding in all quadrants. No rebound or rigidity. No masses. Musculoskeletal: No lower extremity tenderness nor edema.  No joint effusions. Full range of motion of right lower extremity including all joints from the hip to the toes without pain or difficulty. Neurologic:  Normal speech and language. No gross focal neurologic deficits are appreciated. Sensation to light touch first intact about the right lower extremity. Skin:  Skin is warm, dry and intact. No rash, redness, swelling, edema, abnormal warmth, skin sores noted. Psychiatric: Mood and affect are normal. Speech and behavior are normal. Patient exhibits appropriate insight and  judgement.   ____________________________________________   LABS  None ____________________________________________  EKG  None ____________________________________________  RADIOLOGY I, Braxton Feathers, personally viewed and evaluated these images (plain radiographs) as part of my medical decision making, as well as reviewing the written report by the radiologist.  US Venous Img Lower Unilateral Right  Result Date: 01/25/2016 CLINICAL DATA:  75 year old male with right inguinal pain radiating into the thigh EXAM: RIGHT LOWER EXTREMITY VENOUS DOPPLER ULTRASOUND TECHNIQUE: Gray-scale sonography with graded compression, as well as color Doppler and duplex ultrasound were performed to evaluate the lower extremity deep venous systems from the level of the common femoral vein and including the common femoral, femoral, profunda femoral, popliteal and calf veins including the posterior tibial, peroneal and gastrocnemius veins when visible. The superficial great saphenous vein was also interrogated. Spectral Doppler was utilized to evaluate flow at rest and with distal augmentation maneuvers in the common femoral, femoral and popliteal veins. COMPARISON:  None. FINDINGS:  Contralateral Common Femoral Vein: Respiratory phasicity is normal and symmetric with the symptomatic side. No evidence of thrombus. Normal compressibility. Common Femoral Vein: No evidence of thrombus. Normal compressibility, respiratory phasicity and response to augmentation. Saphenofemoral Junction: No evidence of thrombus. Normal compressibility and flow on color Doppler imaging. A verbal Profunda Femoral Vein: No evidence of thrombus. Normal compressibility and flow on color Doppler imaging. Femoral Vein: No evidence of thrombus. Normal compressibility, respiratory phasicity and response to augmentation. Popliteal Vein: No evidence of thrombus. Normal compressibility, respiratory phasicity and response to augmentation. Calf Veins: No  evidence of thrombus. Normal compressibility and flow on color Doppler imaging. Superficial Great Saphenous Vein: No evidence of thrombus. Normal compressibility and flow on color Doppler imaging. Venous Reflux:  None. Other Findings:  None. IMPRESSION: No evidence of deep venous thrombosis. Electronically Signed   By: Jacqulynn Cadet M.D.   On: 01/25/2016 11:53    ____________________________________________    PROCEDURES  Procedure(s) performed: None   Procedures   Medications - No data to display   ____________________________________________   INITIAL IMPRESSION / ASSESSMENT AND PLAN / ED COURSE  Pertinent labs & imaging results that were available during my care of the patient were reviewed by me and considered in my medical decision making (see chart for details).  Clinical Course as of Jan 25 1235  Sun Jan 25, 2016  [place] controlled substance database was accessed and reviewed. No narcotic prescriptions have been filled by the patient in [place] within the last 12 months. [place] controlled substance database was also accessed and reviewed. No patient with the same name and date of birth has filled any narcotic prescriptions within [place], [place], [place] or [place].  [JH]    Clinical Course User Index [JH] Keaundra Stehle L Charisma Charlot, PA-C    Patient's diagnosis is consistent with Right leg pain. Patient without pain on physical exam about the right inguinal region, hip or right lower extremity. Ultrasound of the right lower extremity showed no evidence of DVT. Overall evaluation today is reassuring. Patient will be discharged home with prescriptions for Ultram to take as needed. Patient is to follow up with his primary care provider or Dr. Sabra Heck in orthopedics if symptoms persist past this treatment course. Also discussed with the patient that he should follow up with his urologist for evaluation. Patient is given ED precautions to return to the  ED for any worsening or new symptoms.   ____________________________________________  FINAL CLINICAL IMPRESSION(S) / ED DIAGNOSES  Final diagnoses:  Right leg pain      NEW MEDICATIONS STARTED DURING THIS VISIT:  Discharge Medication List as of 01/25/2016 12:10 PM    START taking these medications   Details  traMADol (ULTRAM) 50 MG tablet Take 1 tablet (50 mg total) by mouth every 6 (six) hours as needed., Starting Sun 01/25/2016, Print             Braxton Feathers, PA-C 01/25/16 1237    Lisa Roca, MD 01/25/16 1617

## 2016-01-30 DIAGNOSIS — M5417 Radiculopathy, lumbosacral region: Secondary | ICD-10-CM | POA: Insufficient documentation

## 2016-01-30 DIAGNOSIS — K4090 Unilateral inguinal hernia, without obstruction or gangrene, not specified as recurrent: Secondary | ICD-10-CM | POA: Insufficient documentation

## 2016-02-05 ENCOUNTER — Encounter: Payer: Self-pay | Admitting: *Deleted

## 2016-02-09 ENCOUNTER — Ambulatory Visit
Admission: EM | Admit: 2016-02-09 | Discharge: 2016-02-09 | Disposition: A | Discharge status: Home / Self Care | Payer: Medicare Other | Attending: Family Medicine | Admitting: Family Medicine

## 2016-02-09 ENCOUNTER — Other Ambulatory Visit: Payer: Self-pay | Admitting: Specialist

## 2016-02-09 DIAGNOSIS — M5418 Radiculopathy, sacral and sacrococcygeal region: Secondary | ICD-10-CM

## 2016-02-09 DIAGNOSIS — M5431 Sciatica, right side: Secondary | ICD-10-CM | POA: Diagnosis not present

## 2016-02-09 DIAGNOSIS — R369 Urethral discharge, unspecified: Secondary | ICD-10-CM | POA: Diagnosis not present

## 2016-02-09 DIAGNOSIS — M545 Low back pain: Secondary | ICD-10-CM | POA: Diagnosis present

## 2016-02-09 LAB — URINALYSIS, COMPLETE (UACMP) WITH MICROSCOPIC
BILIRUBIN URINE: NEGATIVE
Bacteria, UA: NONE SEEN
GLUCOSE, UA: NEGATIVE mg/dL
HGB URINE DIPSTICK: NEGATIVE
KETONES UR: NEGATIVE mg/dL
LEUKOCYTES UA: NEGATIVE
NITRITE: NEGATIVE
PH: 7 (ref 5.0–8.0)
Protein, ur: NEGATIVE mg/dL
SPECIFIC GRAVITY, URINE: 1.015 (ref 1.005–1.030)
WBC, UA: NONE SEEN WBC/hpf (ref 0–5)

## 2016-02-09 LAB — CHLAMYDIA/NGC RT PCR (ARMC ONLY)
Chlamydia Tr: NOT DETECTED
N gonorrhoeae: NOT DETECTED

## 2016-02-09 MED ORDER — PREDNISONE 20 MG PO TABS: 20.0000 mg | ORAL_TABLET | Freq: Every day | ORAL | 0 refills | Status: DC

## 2016-02-09 NOTE — ED Triage Notes (Signed)
Pt is here with right leg pain, however he is afraid it is from an STD. He isnt sexually active, however he did have oral sex performed on him in September and has had itching and discharge.

## 2016-02-17 ENCOUNTER — Ambulatory Visit (INDEPENDENT_AMBULATORY_CARE_PROVIDER_SITE_OTHER): Payer: Medicare Other | Admitting: General Surgery

## 2016-02-17 ENCOUNTER — Encounter: Payer: Self-pay | Admitting: General Surgery

## 2016-02-17 VITALS — BP 138/78 | HR 80 | Resp 16 | Ht 71.0 in | Wt 183.0 lb

## 2016-02-17 DIAGNOSIS — R1031 Right lower quadrant pain: Secondary | ICD-10-CM

## 2016-02-17 DIAGNOSIS — Z86010 Personal history of colonic polyps: Secondary | ICD-10-CM

## 2016-02-17 DIAGNOSIS — R1310 Dysphagia, unspecified: Secondary | ICD-10-CM

## 2016-02-17 NOTE — Progress Notes (Signed)
Patient ID: Billy Ortiz, male   DOB: [birth year], 75 y.o.   MRN: [id number]  Chief Complaint  Patient presents with  . Other    HPI Billy Ortiz is a 75 y.o. male here today for an evaluation of right groin pain. Patient states he noticed this area about an month ago. He states pain is with activity. He also c/o pain in back of right hip and pain running down his legs.  Son Billy Ortiz present  . Patient is scheduled for a MRI of  back on 02/20/2016. He has also been having troubling swallowing-only with liquids  He previously had colonoscopy and endoscopy in 2009. HPI  Past Medical History:  Diagnosis Date  . Cancer [facility])    prostate  . Cataract   . Elevated lipids   . GERD (gastroesophageal reflux disease)   . HOH (hard of hearing)   . Hypertension   . Pneumonia   . Pulmonary nodules/lesions, multiple   . Shortness of breath dyspnea     Past Surgical History:  Procedure Laterality Date  . BACK SURGERY     discectomy ,reports right foot drop since surgery   . CATARACT EXTRACTION W/PHACO Left 04/22/2015   Procedure: CATARACT EXTRACTION PHACO AND INTRAOCULAR LENS PLACEMENT (IOC);  Surgeon: Birder Robson, MD;  Location: [facility] ORS;  Service: Ophthalmology;  Laterality: Left;  [place]     1:17.8 AP%   25.7 CDE    20.03 fluid pack lot # [id number]  . COLONOSCOPY  2009    No family history on file.  Social History Social History  Substance Use Topics  . Smoking status: Current Every Day Smoker    Types: Cigarettes  . Smokeless tobacco: Never Used  . Alcohol use No    No Known Allergies  Current Outpatient Prescriptions  Medication Sig Dispense Refill  . aspirin EC 81 MG tablet Take 81 mg by mouth daily.    Marland Kitchen atorvastatin (LIPITOR) 20 MG tablet Take 20 mg by mouth daily.    . furosemide (LASIX) 20 MG tablet Take 20 mg by mouth.    . losartan (COZAAR) 50 MG tablet Take 50 mg by mouth daily.    Marland Kitchen omeprazole (PRILOSEC) 20 MG capsule Take 20 mg by mouth daily.    . predniSONE  (DELTASONE) 20 MG tablet Take 1 tablet (20 mg total) by mouth daily. 5 tablet 0  . traMADol (ULTRAM) 50 MG tablet Take 1 tablet (50 mg total) by mouth every 6 (six) hours as needed. 10 tablet 0  . vitamin B-12 (CYANOCOBALAMIN) 500 MCG tablet Take 500 mcg by mouth daily.     No current facility-administered medications for this visit.     Review of Systems Review of Systems  Constitutional: Negative.   Respiratory: Negative.   Cardiovascular: Negative.     Blood pressure 138/78, pulse 80, resp. rate 16, height '5\' 11"'$  (1.803 m), weight 183 lb (83 kg).  Physical Exam Physical Exam  Constitutional: He is oriented to person, place, and time. He appears well-developed and well-nourished.  Eyes: Conjunctivae are normal. No scleral icterus.  Neck: Neck supple.  Cardiovascular: Normal rate, regular rhythm and normal heart sounds.   Pulmonary/Chest: Effort normal and breath sounds normal.  Abdominal: Soft. Normal appearance and bowel sounds are normal. He exhibits no mass. There is no hepatomegaly. There is no tenderness. A hernia (small umbilical hernia) is present. Hernia confirmed negative in the right inguinal area and confirmed negative in the left inguinal area.  Lymphadenopathy:  He has no cervical adenopathy.       Right: Inguinal adenopathy present.       Left: Inguinal adenopathy present.  Few small nodes in both groins. Located in lateral location on both sides  Neurological: He is alert and oriented to person, place, and time.  Skin: Skin is warm and dry.    Data Reviewed Notes reviewed  CT abd/pelvis done last month-no hernia noted in right groin Assessment    No hernia  noted in right groin. Pain here is unexplained. Small umbilical hernia, asymptomatic. History of colon polyps and recurrence of some swallowing difficulty.    Plan   Discussed at length with pt and his son.  No findings noted in right groin to explain his pain. Possible this is related to his hip  and/or his back. He needs surveillance colonoscopy, and repeat upper endoscopy. He is agreeable.   Colonoscopy with possible biopsy/polypectomy prn: Information regarding the procedure, including its potential risks and complications (including but not limited to perforation of the bowel, which may require emergency surgery to repair, and bleeding) was verbally given to the patient. Educational information regarding lower intestinal endoscopy was given to the patient. Written instructions for how to complete the bowel prep using Miralax were provided. The importance of drinking ample fluids to avoid dehydration as a result of the prep emphasized.    This information has been scribed by Gaspar Cola CMA.    Nivek Powley G 02/17/2016, 10:54 AM

## 2016-02-17 NOTE — Patient Instructions (Addendum)
Patient to have a cat scan done.  Colonoscopy, Adult A colonoscopy is an exam to look at the entire large intestine. During the exam, a lubricated, bendable tube is inserted into the anus and then passed into the rectum, colon, and other parts of the large intestine. A colonoscopy is often done as a part of normal colorectal screening or in response to certain symptoms, such as anemia, persistent diarrhea, abdominal pain, and blood in the stool. The exam can help screen for and diagnose medical problems, including:  Tumors.  Polyps.  Inflammation.  Areas of bleeding. Tell a health care provider about:  Any allergies you have.  All medicines you are taking, including vitamins, herbs, eye drops, creams, and over-the-counter medicines.  Any problems you or family members have had with anesthetic medicines.  Any blood disorders you have.  Any surgeries you have had.  Any medical conditions you have.  Any problems you have had passing stool. What are the risks? Generally, this is a safe procedure. However, problems may occur, including:  Bleeding.  A tear in the intestine.  A reaction to medicines given during the exam.  Infection (rare). What happens before the procedure? Eating and drinking restrictions  Follow instructions from your health care provider about eating and drinking, which may include:  A few days before the procedure - follow a low-fiber diet. Avoid nuts, seeds, dried fruit, raw fruits, and vegetables.  1-3 days before the procedure - follow a clear liquid diet. Drink only clear liquids, such as clear broth or bouillon, black coffee or tea, clear juice, clear soft drinks or sports drinks, gelatin desert, and popsicles. Avoid any liquids that contain red or purple dye.  On the day of the procedure - do not eat or drink anything during the 2 hours before the procedure, or within the time period that your health care provider recommends. Bowel prep  If you were  prescribed an oral bowel prep to clean out your colon:  Take it as told by your health care provider. Starting the day before your procedure, you will need to drink a large amount of medicated liquid. The liquid will cause you to have multiple loose stools until your stool is almost clear or light green.  If your skin or anus gets irritated from diarrhea, you may use these to relieve the irritation:  Medicated wipes, such as adult wet wipes with aloe and vitamin E.  A skin soothing-product like petroleum jelly.  If you vomit while drinking the bowel prep, take a break for up to 60 minutes and then begin the bowel prep again. If vomiting continues and you cannot take the bowel prep without vomiting, call your health care provider. General instructions  Ask your health care provider about changing or stopping your regular medicines. This is especially important if you are taking diabetes medicines or blood thinners.  Plan to have someone take you home from the hospital or clinic. What happens during the procedure?  An IV tube may be inserted into one of your veins.  You will be given medicine to help you relax (sedative).  To reduce your risk of infection:  Your health care team will wash or sanitize their hands.  Your anal area will be washed with soap.  You will be asked to lie on your side with your knees bent.  Your health care provider will lubricate a long, thin, flexible tube. The tube will have a camera and a light on the end.  The  tube will be inserted into your anus.  The tube will be gently eased through your rectum and colon.  Air will be delivered into your colon to keep it open. You may feel some pressure or cramping.  The camera will be used to take images during the procedure.  A small tissue sample may be removed from your body to be examined under a microscope (biopsy). If any potential problems are found, the tissue will be sent to a lab for testing.  If  small polyps are found, your health care provider may remove them and have them checked for cancer cells.  The tube that was inserted into your anus will be slowly removed. The procedure may vary among health care providers and hospitals. What happens after the procedure?  Your blood pressure, heart rate, breathing rate, and blood oxygen level will be monitored until the medicines you were given have worn off.  Do not drive for 24 hours after the exam.  You may have a small amount of blood in your stool.  You may pass gas and have mild abdominal cramping or bloating due to the air that was used to inflate your colon during the exam.  It is up to you to get the results of your procedure. Ask your health care provider, or the department performing the procedure, when your results will be ready. This information is not intended to replace advice given to you by your health care provider. Make sure you discuss any questions you have with your health care provider. Document Released: 02/13/2000 Document Revised: 09/05/2015 Document Reviewed: 04/29/2015 Elsevier Interactive Patient Education  2017 [employer].

## 2016-02-18 ENCOUNTER — Other Ambulatory Visit: Payer: Self-pay | Admitting: *Deleted

## 2016-02-18 ENCOUNTER — Telehealth: Payer: Self-pay | Admitting: *Deleted

## 2016-02-18 MED ORDER — POLYETHYLENE GLYCOL 3350 17 GM/SCOOP PO POWD: ORAL | 0 refills | Status: DC

## 2016-02-18 NOTE — Telephone Encounter (Signed)
Message left for patient's son to call the office.   Patient has been scheduled for an upper and lower endoscopy for 03-10-16 at [facility].   We need to confirm insurance will be the same 03-01-16.   Also, need to see if patient received colonoscopy instructions at visit yesterday and if they have any questions.   Miralax prescription has been sent in to patient's pharmacy today.   It is okay for patient to continue 81 mg aspirin once daily. Also, if patient normally takes blood pressure medication in the mornings, he will only take this medication the morning of procedure at 6 am with a small sip of water.

## 2016-02-18 NOTE — Telephone Encounter (Signed)
Patient's son called the office back this afternoon.   He states insurance will be the same starting in January.   Instructions were reviewed by phone and he verbalizes understanding.   The patient's son was instructed to call the office if he has further questions.

## 2016-02-20 ENCOUNTER — Ambulatory Visit
Admission: RE | Admit: 2016-02-20 | Discharge: 2016-02-20 | Disposition: A | Discharge status: Home / Self Care | Payer: Medicare Other | Source: Ambulatory Visit | Attending: Specialist | Admitting: Specialist

## 2016-02-20 DIAGNOSIS — M5417 Radiculopathy, lumbosacral region: Secondary | ICD-10-CM | POA: Diagnosis present

## 2016-02-20 DIAGNOSIS — M5126 Other intervertebral disc displacement, lumbar region: Secondary | ICD-10-CM | POA: Insufficient documentation

## 2016-02-20 DIAGNOSIS — I714 Abdominal aortic aneurysm, without rupture: Secondary | ICD-10-CM | POA: Insufficient documentation

## 2016-02-20 DIAGNOSIS — M48061 Spinal stenosis, lumbar region without neurogenic claudication: Secondary | ICD-10-CM | POA: Insufficient documentation

## 2016-02-20 DIAGNOSIS — M5418 Radiculopathy, sacral and sacrococcygeal region: Secondary | ICD-10-CM

## 2016-02-25 DIAGNOSIS — N183 Chronic kidney disease, stage 3 unspecified: Secondary | ICD-10-CM | POA: Insufficient documentation

## 2016-02-26 ENCOUNTER — Encounter: Payer: Self-pay | Admitting: General Surgery

## 2016-03-03 ENCOUNTER — Telehealth: Payer: Self-pay | Admitting: *Deleted

## 2016-03-03 NOTE — Telephone Encounter (Signed)
Patient's upper and lower endoscopy has been rescheduled per son's request to reschedule since he will not be able to provide transportation that day. This was moved from 03-10-16 to 03-24-16.  Trish in Endoscopy is aware of date change.

## 2016-03-09 ENCOUNTER — Other Ambulatory Visit: Payer: Self-pay | Admitting: General Surgery

## 2016-03-09 ENCOUNTER — Other Ambulatory Visit: Payer: Self-pay

## 2016-03-09 MED ORDER — POLYETHYLENE GLYCOL 3350 17 GM/SCOOP PO POWD: 1.0000 | Freq: Once | ORAL | 0 refills | Status: AC

## 2016-03-19 ENCOUNTER — Other Ambulatory Visit: Payer: Self-pay | Admitting: General Surgery

## 2016-03-24 ENCOUNTER — Ambulatory Visit: Payer: Medicare Other | Admitting: Anesthesiology

## 2016-03-24 ENCOUNTER — Ambulatory Visit
Admission: RE | Admit: 2016-03-24 | Discharge: 2016-03-24 | Disposition: A | Discharge status: Home / Self Care | Payer: Medicare Other | Source: Ambulatory Visit | Attending: General Surgery | Admitting: General Surgery

## 2016-03-24 ENCOUNTER — Encounter
Admission: RE | Disposition: A | Discharge status: Home / Self Care | Payer: Self-pay | Source: Ambulatory Visit | Attending: General Surgery

## 2016-03-24 ENCOUNTER — Encounter: Payer: Self-pay | Admitting: *Deleted

## 2016-03-24 DIAGNOSIS — K2970 Gastritis, unspecified, without bleeding: Secondary | ICD-10-CM | POA: Insufficient documentation

## 2016-03-24 DIAGNOSIS — Z7982 Long term (current) use of aspirin: Secondary | ICD-10-CM | POA: Diagnosis not present

## 2016-03-24 DIAGNOSIS — F17210 Nicotine dependence, cigarettes, uncomplicated: Secondary | ICD-10-CM | POA: Diagnosis not present

## 2016-03-24 DIAGNOSIS — R1310 Dysphagia, unspecified: Secondary | ICD-10-CM | POA: Diagnosis not present

## 2016-03-24 DIAGNOSIS — Z86010 Personal history of colonic polyps: Secondary | ICD-10-CM | POA: Insufficient documentation

## 2016-03-24 DIAGNOSIS — K219 Gastro-esophageal reflux disease without esophagitis: Secondary | ICD-10-CM | POA: Diagnosis not present

## 2016-03-24 DIAGNOSIS — Z1211 Encounter for screening for malignant neoplasm of colon: Secondary | ICD-10-CM | POA: Diagnosis present

## 2016-03-24 DIAGNOSIS — I10 Essential (primary) hypertension: Secondary | ICD-10-CM | POA: Diagnosis not present

## 2016-03-24 DIAGNOSIS — J449 Chronic obstructive pulmonary disease, unspecified: Secondary | ICD-10-CM | POA: Insufficient documentation

## 2016-03-24 DIAGNOSIS — K3189 Other diseases of stomach and duodenum: Secondary | ICD-10-CM

## 2016-03-24 HISTORY — PX: ESOPHAGOGASTRODUODENOSCOPY (EGD) WITH PROPOFOL: SHX5813

## 2016-03-24 HISTORY — PX: COLONOSCOPY WITH PROPOFOL: SHX5780

## 2016-03-24 SURGERY — COLONOSCOPY WITH PROPOFOL
Anesthesia: General

## 2016-03-24 MED ORDER — SODIUM CHLORIDE 0.9 % IV SOLN
INTRAVENOUS | Status: DC
Start: 2016-03-24 — End: 2016-03-26
  Administered 2016-03-24: 09:00:00 via INTRAVENOUS

## 2016-03-24 MED ORDER — PROPOFOL 10 MG/ML IV BOLUS
INTRAVENOUS | Status: AC
  Filled 2016-03-24: qty 20

## 2016-03-24 MED ORDER — PROPOFOL 10 MG/ML IV BOLUS
INTRAVENOUS | Status: DC | PRN
  Administered 2016-03-24 (×2): 20 mg via INTRAVENOUS

## 2016-03-24 MED ORDER — LIDOCAINE HCL (CARDIAC) 20 MG/ML IV SOLN
INTRAVENOUS | Status: DC | PRN
  Administered 2016-03-24: 2 mL via INTRAVENOUS

## 2016-03-24 MED ORDER — LIDOCAINE HCL (PF) 2 % IJ SOLN
INTRAMUSCULAR | Status: AC
  Filled 2016-03-24: qty 2

## 2016-03-24 MED ORDER — PROPOFOL 500 MG/50ML IV EMUL
INTRAVENOUS | Status: DC | PRN
  Administered 2016-03-24: 120 ug/kg/min via INTRAVENOUS

## 2016-03-24 NOTE — Anesthesia Post-op Follow-up Note (Signed)
Anesthesia QCDR form completed.        

## 2016-03-24 NOTE — Transfer of Care (Signed)
Immediate Anesthesia Transfer of Care Note  Patient: Billy Ortiz  Procedure(s) Performed: Procedure(s): COLONOSCOPY WITH PROPOFOL (N/A) ESOPHAGOGASTRODUODENOSCOPY (EGD) WITH PROPOFOL (N/A)  Patient Location: PACU  Anesthesia Type:General  Level of Consciousness: awake  Airway & Oxygen Therapy: Patient Spontanous Breathing and Patient connected to nasal cannula oxygen  Post-op Assessment: Report given to RN and Post -op Vital signs reviewed and stable  Post vital signs: Reviewed  Last Vitals:  Vitals:   03/24/16 0858  BP: (!) 149/85  Pulse: 79  Resp: 16  Temp: 36.3 C    Last Pain:  Vitals:   03/24/16 0858  TempSrc: Tympanic         Complications: No apparent anesthesia complications

## 2016-03-24 NOTE — Op Note (Signed)
[facility] Gastroenterology Patient Name: Billy Ortiz Procedure Date: 03/24/2016 9:56 AM MRN: [id number] Account #: [id number] Date of Birth: 11/18/1940 Admit Type: Outpatient Age: 76 Room: [facility] ENDO ROOM 1 Gender: Male Note Status: Finalized Procedure:            Colonoscopy Indications:          High risk colon cancer surveillance: Personal history                        of colonic polyps Providers:            Seeplaputhur G. [name], MD Medicines:            General Anesthesia Complications:        No immediate complications. Procedure:            Pre-Anesthesia Assessment:                       - General anesthesia under the supervision of an                        anesthesiologist was determined to be medically                        necessary for this procedure based on review of the                        patient's medical history, medications, and prior                        anesthesia history.                       After obtaining informed consent, the colonoscope was                        passed under direct vision. Throughout the procedure,                        the patient's blood pressure, pulse, and oxygen                        saturations were monitored continuously. The                        Colonoscope was introduced through the anus and                        advanced to the the cecum, identified by the ileocecal                        valve. The colonoscopy was performed without                        difficulty. The patient tolerated the procedure well.                        The quality of the bowel preparation was good. Findings:      The perianal and digital rectal examinations were normal.      The entire examined colon appeared normal on direct and retroflexion       views. Impression:           -  The entire examined colon is normal on direct and                        retroflexion views.                       - No specimens  collected. Recommendation:       - Discharge patient to home.                       - Resume previous diet.                       - Continue present medications. Procedure Code(s):    --- Professional ---                       [phone], Colonoscopy, flexible; diagnostic, including                        collection of specimen(s) by brushing or washing, when                        performed (separate procedure) Diagnosis Code(s):    --- Professional ---                       Z86.010, Personal history of colonic polyps CPT copyright 2016 American Medical Association. All rights reserved. The codes documented in this report are preliminary and upon coder review may  be revised to meet current compliance requirements. Christene Lye, MD 03/24/2016 10:42:08 AM This report has been signed electronically. Number of Addenda: 0 Note Initiated On: 03/24/2016 9:56 AM Scope Withdrawal Time: 0 hours 17 minutes 16 seconds  Total Procedure Duration: 0 hours 23 minutes 8 seconds       [facility]

## 2016-03-24 NOTE — Anesthesia Preprocedure Evaluation (Signed)
Anesthesia Evaluation  Patient identified by MRN, date of birth, ID band Patient awake    Reviewed: Allergy & Precautions  Airway Mallampati: II       Dental  (+) Upper Dentures   Pulmonary shortness of breath, COPD, Current Smoker,     + decreased breath sounds      Cardiovascular Exercise Tolerance: Good hypertension, Pt. on medications  Rhythm:Regular     Neuro/Psych negative neurological ROS     GI/Hepatic Neg liver ROS, GERD  Medicated,  Endo/Other  negative endocrine ROS  Renal/GU negative Renal ROS     Musculoskeletal   Abdominal Normal abdominal exam  (+)   Peds  Hematology   Anesthesia Other Findings   Reproductive/Obstetrics                             Anesthesia Physical Anesthesia Plan  ASA: III  Anesthesia Plan: General   Post-op Pain Management:    Induction: Intravenous  Airway Management Planned: Natural Airway and Nasal Cannula  Additional Equipment:   Intra-op Plan:   Post-operative Plan:   Informed Consent: I have reviewed the patients History and Physical, chart, labs and discussed the procedure including the risks, benefits and alternatives for the proposed anesthesia with the patient or authorized representative who has indicated his/her understanding and acceptance.     Plan Discussed with: Surgeon  Anesthesia Plan Comments:         Anesthesia Quick Evaluation

## 2016-03-24 NOTE — Op Note (Signed)
[facility] Gastroenterology Patient Name: Billy Ortiz Procedure Date: 03/24/2016 9:58 AM MRN: [id number] Account #: [id number] Date of Birth: Oct 13, 1940 Admit Type: Outpatient Age: 76 Room: [facility] ENDO ROOM 1 Gender: Male Note Status: Finalized Procedure:            Upper GI endoscopy Indications:          Dysphagia Providers:            Tayo Maute G. [name], MD Medicines:            General Anesthesia Complications:        No immediate complications. Procedure:            Pre-Anesthesia Assessment:                       - General anesthesia under the supervision of an                        anesthesiologist was determined to be medically                        necessary for this procedure based on review of the                        patient's medical history, medications, and prior                        anesthesia history.                       After obtaining informed consent, the endoscope was                        passed under direct vision. Throughout the procedure,                        the patient's blood pressure, pulse, and oxygen                        saturations were monitored continuously. The Endoscope                        was introduced through the mouth, and advanced to the                        second part of duodenum. The upper GI endoscopy was                        accomplished without difficulty. The patient tolerated                        the procedure well. Findings:      The esophagus was normal.      Patchy mildly erythematous mucosa was found at the pylorus. Biopsies       were taken with a cold forceps for histology.      The examined duodenum was normal.      The exam was otherwise without abnormality. Impression:           - Normal esophagus.                       - Erythematous mucosa  in the pylorus. Biopsied.                       - Normal examined duodenum.                       - The examination was otherwise  normal. Recommendation:       - Await pathology results. Procedure Code(s):    --- Professional ---                       [phone], Esophagogastroduodenoscopy, flexible, transoral;                        with biopsy, single or multiple Diagnosis Code(s):    --- Professional ---                       K31.89, Other diseases of stomach and duodenum                       R13.10, Dysphagia, unspecified CPT copyright 2016 American Medical Association. All rights reserved. The codes documented in this report are preliminary and upon coder review may  be revised to meet current compliance requirements. Christene Lye, MD 03/24/2016 10:14:09 AM This report has been signed electronically. Number of Addenda: 0 Note Initiated On: 03/24/2016 9:58 AM      [facility]

## 2016-03-24 NOTE — Anesthesia Postprocedure Evaluation (Signed)
Anesthesia Post Note  Patient: Billy Ortiz  Procedure(s) Performed: Procedure(s) (LRB): COLONOSCOPY WITH PROPOFOL (N/A) ESOPHAGOGASTRODUODENOSCOPY (EGD) WITH PROPOFOL (N/A)  Patient location during evaluation: PACU Anesthesia Type: General Level of consciousness: awake Pain management: pain level controlled Vital Signs Assessment: post-procedure vital signs reviewed and stable Respiratory status: nonlabored ventilation Cardiovascular status: stable Anesthetic complications: no     Last Vitals:  Vitals:   03/24/16 1050 03/24/16 1110  BP: (!) 149/85 (!) 161/92  Pulse: 80 63  Resp: (!) 30 (!) 26  Temp:      Last Pain:  Vitals:   03/24/16 1040  TempSrc: Tympanic                 VAN STAVEREN,Mahlet Jergens

## 2016-03-24 NOTE — H&P (Signed)
Billy Ortiz is an 76 y.o. male.   Chief Complaint: trouble swallowing, need surveillance colonoscopy HPI:75 yr old male with history of colon polps. Noted some trouble swallowing staring recently. No heartburn, n/v. Also has back problems and some pain in right groin area. Exam and CT with no findings. He is due for surveillance colonoscopy and given his dysphagia will plan for upper endoscopy also.  Past Medical History:  Diagnosis Date  . Cancer [facility])    prostate  . Cataract   . Elevated lipids   . GERD (gastroesophageal reflux disease)   . HOH (hard of hearing)   . Hypertension   . Pneumonia   . Pulmonary nodules/lesions, multiple   . Shortness of breath dyspnea     Past Surgical History:  Procedure Laterality Date  . BACK SURGERY     discectomy ,reports right foot drop since surgery   . CATARACT EXTRACTION W/PHACO Left 04/22/2015   Procedure: CATARACT EXTRACTION PHACO AND INTRAOCULAR LENS PLACEMENT (IOC);  Surgeon: Birder Robson, MD;  Location: [facility] ORS;  Service: Ophthalmology;  Laterality: Left;  [place]     1:17.8 AP%   25.7 CDE    20.03 fluid pack lot # [id number]  . COLONOSCOPY  2009    History reviewed. No pertinent family history. Social History:  reports that he has been smoking Cigarettes.  He has never used smokeless tobacco. He reports that he does not drink alcohol or use drugs.  Allergies: No Known Allergies  Medications Prior to Admission  Medication Sig Dispense Refill  . traMADol (ULTRAM) 50 MG tablet Take 1 tablet (50 mg total) by mouth every 6 (six) hours as needed. 10 tablet 0  . vitamin B-12 (CYANOCOBALAMIN) 500 MCG tablet Take 500 mcg by mouth daily.    Marland Kitchen aspirin EC 81 MG tablet Take 81 mg by mouth daily.    Marland Kitchen atorvastatin (LIPITOR) 20 MG tablet Take 20 mg by mouth daily.    . furosemide (LASIX) 20 MG tablet Take 20 mg by mouth.    . losartan (COZAAR) 50 MG tablet Take 50 mg by mouth daily.    Marland Kitchen omeprazole (PRILOSEC) 20 MG capsule Take 20 mg by mouth  daily.    . polyethylene glycol powder (GLYCOLAX/MIRALAX) powder 255 grams one bottle for colonoscopy prep 255 g 0  . predniSONE (DELTASONE) 20 MG tablet Take 1 tablet (20 mg total) by mouth daily. 5 tablet 0    No results found for this or any previous visit (from the past 48 hour(s)). No results found.  Review of Systems  Constitutional: Negative.   Respiratory: Negative.   Cardiovascular: Negative.   Gastrointestinal: Positive for abdominal pain (right lower abdomen). Negative for blood in stool, constipation, diarrhea, heartburn, nausea and vomiting.       Some difficulty swallowing, this is more recent  Genitourinary: Negative.     Blood pressure (!) 149/85, Ortiz 79, temperature 97.4 F (36.3 C), temperature source Tympanic, resp. rate 16, height '5\' 11"'$  (1.803 m), weight 173 lb (78.5 kg), SpO2 100 %. Physical Exam  Constitutional: He is oriented to person, place, and time. He appears well-developed and well-nourished.  HENT:  Head: Normocephalic.  Eyes: Conjunctivae are normal. No scleral icterus.  Neck: Neck supple.  Cardiovascular: Normal rate and regular rhythm.   Respiratory: Effort normal and breath sounds normal.  GI: Soft. Bowel sounds are normal. He exhibits no distension and no mass.  Lymphadenopathy:    He has no cervical adenopathy.  Neurological: He is alert  and oriented to person, place, and time.  Skin: Skin is warm and dry.     Assessment/Plan Upper endoscopy and colonoscopy as planned  Christene Lye, MD 03/24/2016, 9:45 AM

## 2016-03-24 NOTE — Interval H&P Note (Signed)
History and Physical Interval Note:  03/24/2016 9:50 AM  Billy Ortiz  has presented today for surgery, with the diagnosis of PH COLON POLYP DYSPHAGIA  The various methods of treatment have been discussed with the patient and family. After consideration of risks, benefits and other options for treatment, the patient has consented to  Procedure(s): COLONOSCOPY WITH PROPOFOL (N/A) ESOPHAGOGASTRODUODENOSCOPY (EGD) WITH PROPOFOL (N/A) as a surgical intervention .  The patient's history has been reviewed, patient examined, no change in status, stable for surgery.  I have reviewed the patient's chart and labs.  Questions were answered to the patient's satisfaction.     Emmanuela Ghazi G

## 2016-03-25 ENCOUNTER — Encounter: Payer: Self-pay | Admitting: General Surgery

## 2016-03-25 LAB — SURGICAL PATHOLOGY

## 2016-04-01 ENCOUNTER — Encounter: Payer: Self-pay | Admitting: Emergency Medicine

## 2016-04-01 ENCOUNTER — Emergency Department: Payer: Medicare Other

## 2016-04-01 ENCOUNTER — Emergency Department
Admission: EM | Admit: 2016-04-01 | Discharge: 2016-04-01 | Disposition: A | Discharge status: Home / Self Care | Payer: Medicare Other | Attending: Emergency Medicine | Admitting: Emergency Medicine

## 2016-04-01 DIAGNOSIS — Z8546 Personal history of malignant neoplasm of prostate: Secondary | ICD-10-CM | POA: Insufficient documentation

## 2016-04-01 DIAGNOSIS — F17210 Nicotine dependence, cigarettes, uncomplicated: Secondary | ICD-10-CM | POA: Insufficient documentation

## 2016-04-01 DIAGNOSIS — Z7982 Long term (current) use of aspirin: Secondary | ICD-10-CM | POA: Diagnosis not present

## 2016-04-01 DIAGNOSIS — I10 Essential (primary) hypertension: Secondary | ICD-10-CM | POA: Diagnosis not present

## 2016-04-01 DIAGNOSIS — R0789 Other chest pain: Secondary | ICD-10-CM | POA: Insufficient documentation

## 2016-04-01 DIAGNOSIS — Z79899 Other long term (current) drug therapy: Secondary | ICD-10-CM | POA: Diagnosis not present

## 2016-04-01 DIAGNOSIS — R079 Chest pain, unspecified: Secondary | ICD-10-CM

## 2016-04-01 LAB — BASIC METABOLIC PANEL
Anion gap: 6 (ref 5–15)
BUN: 9 mg/dL (ref 6–20)
CALCIUM: 10 mg/dL (ref 8.9–10.3)
CO2: 26 mmol/L (ref 22–32)
CREATININE: 1.49 mg/dL — AB (ref 0.61–1.24)
Chloride: 108 mmol/L (ref 101–111)
GFR calc non Af Amer: 44 mL/min — ABNORMAL LOW (ref >60)
GFR, EST AFRICAN AMERICAN: 51 mL/min — AB (ref >60)
Glucose, Bld: 86 mg/dL (ref 65–99)
Potassium: 3.9 mmol/L (ref 3.5–5.1)
SODIUM: 140 mmol/L (ref 135–145)

## 2016-04-01 LAB — CBC
HCT: 39.4 % — ABNORMAL LOW (ref 40.0–52.0)
Hemoglobin: 13.2 g/dL (ref 13.0–18.0)
MCH: 25.2 pg — AB (ref 26.0–34.0)
MCHC: 33.5 g/dL (ref 32.0–36.0)
MCV: 75.3 fL — AB (ref 80.0–100.0)
PLATELETS: 264 10*3/uL (ref 150–440)
RBC: 5.23 MIL/uL (ref 4.40–5.90)
RDW: 14.9 % — ABNORMAL HIGH (ref 11.5–14.5)
WBC: 7.3 10*3/uL (ref 3.8–10.6)

## 2016-04-01 LAB — TROPONIN I: Troponin I: <0.03 ng/mL (ref <0.03)

## 2016-04-01 MED ORDER — GI COCKTAIL ~~LOC~~
30.0000 mL | Freq: Once | ORAL | Status: AC
  Administered 2016-04-01: 30 mL via ORAL
  Filled 2016-04-01: qty 30

## 2016-04-01 NOTE — ED Provider Notes (Signed)
[facility] Emergency Department Provider Note  Time seen: 1:26 PM  I have reviewed the triage vital signs and the nursing notes.   HISTORY  Chief Complaint Chest Pain    HPI Billy Ortiz is a 76 y.o. male with a past medical history of prostate cancer, gastric reflux, hypertension, presents to the emergency department with chest discomfort. According to the patient last night he developed chest discomfort which she states feels like gas or gastric reflux which the patient has a history of. Patient states this morning he continued to have symptoms so he came to the emergency department for evaluation. He states upon arriving to the emergency department the discomfort has largely resolved. Minimal discomfort currently. Denies any shortness of breath nausea or diaphoresis.  Past Medical History:  Diagnosis Date  . Cancer [facility])    prostate  . Cataract   . Elevated lipids   . GERD (gastroesophageal reflux disease)   . HOH (hard of hearing)   . Hypertension   . Pneumonia   . Pulmonary nodules/lesions, multiple   . Shortness of breath dyspnea     There are no active problems to display for this patient.   Past Surgical History:  Procedure Laterality Date  . BACK SURGERY     discectomy ,reports right foot drop since surgery   . CATARACT EXTRACTION W/PHACO Left 04/22/2015   Procedure: CATARACT EXTRACTION PHACO AND INTRAOCULAR LENS PLACEMENT (IOC);  Surgeon: Birder Robson, MD;  Location: [facility] ORS;  Service: Ophthalmology;  Laterality: Left;  [place]     1:17.8 AP%   25.7 CDE    20.03 fluid pack lot # [id number] H  . COLONOSCOPY  2009  . COLONOSCOPY WITH PROPOFOL N/A 03/24/2016   Procedure: COLONOSCOPY WITH PROPOFOL;  Surgeon: Christene Lye, MD;  Location: [facility] ENDOSCOPY;  Service: Endoscopy;  Laterality: N/A;  . ESOPHAGOGASTRODUODENOSCOPY (EGD) WITH PROPOFOL N/A 03/24/2016   Procedure: ESOPHAGOGASTRODUODENOSCOPY (EGD) WITH PROPOFOL;  Surgeon: Christene Lye, MD;  Location: [facility] ENDOSCOPY;  Service: Endoscopy;  Laterality: N/A;    Prior to Admission medications   Medication Sig Start Date End Date Taking? Authorizing Provider  aspirin EC 81 MG tablet Take 81 mg by mouth daily.    Historical Provider, MD  atorvastatin (LIPITOR) 20 MG tablet Take 20 mg by mouth daily.    Historical Provider, MD  furosemide (LASIX) 20 MG tablet Take 20 mg by mouth.    Historical Provider, MD  losartan (COZAAR) 50 MG tablet Take 50 mg by mouth daily.    Historical Provider, MD  omeprazole (PRILOSEC) 20 MG capsule Take 20 mg by mouth daily.    Historical Provider, MD  polyethylene glycol powder (GLYCOLAX/MIRALAX) powder 255 grams one bottle for colonoscopy prep 02/18/16   Seeplaputhur Robinette Haines, MD  predniSONE (DELTASONE) 20 MG tablet Take 1 tablet (20 mg total) by mouth daily. 02/09/16   Norval Gable, MD  traMADol (ULTRAM) 50 MG tablet Take 1 tablet (50 mg total) by mouth every 6 (six) hours as needed. 01/25/16   Jami L Hagler, PA-C  vitamin B-12 (CYANOCOBALAMIN) 500 MCG tablet Take 500 mcg by mouth daily.    Historical Provider, MD    No Known Allergies  History reviewed. No pertinent family history.  Social History Social History  Substance Use Topics  . Smoking status: Current Every Day Smoker    Packs/day: 1.00    Years: 60.00    Types: Cigarettes  . Smokeless tobacco: Never Used  . Alcohol use No  Review of Systems Constitutional: Negative for fever. Cardiovascular: Positive for chest discomfort Respiratory: Negative for shortness of breath. Gastrointestinal: Negative for abdominal pain Neurological: Negative for headache 10-point ROS otherwise negative.  ____________________________________________   PHYSICAL EXAM:  VITAL SIGNS: ED Triage Vitals  Enc Vitals Group     BP 04/01/16 1130 126/84     Pulse Rate 04/01/16 1130 85     Resp 04/01/16 1130 16     Temp 04/01/16 1130 97.9 F (36.6 C)     Temp Source 04/01/16 1130 Oral      SpO2 04/01/16 1130 97 %     Weight 04/01/16 1131 173 lb (78.5 kg)     Height 04/01/16 1131 '5\' 11"'$  (1.803 m)     Head Circumference --      Peak Flow --      Pain Score 04/01/16 1135 5     Pain Loc --      Pain Edu? --      Excl. in [place]? --     Constitutional: Alert and oriented. Well appearing and in no distress. Eyes: Normal exam ENT   Head: Normocephalic and atraumatic   Mouth/Throat: Mucous membranes are moist. Cardiovascular: Normal rate, regular rhythm. No murmur Respiratory: Normal respiratory effort without tachypnea nor retractions. Breath sounds are clear  Gastrointestinal: Soft and nontender. No distention.   Musculoskeletal: Nontender with normal range of motion in all extremities.  Neurologic:  Normal speech and language. No gross focal neurologic deficits Skin:  Skin is warm, dry and intact.  Psychiatric: Mood and affect are normal.   ____________________________________________    EKG  EKG reviewed and interpreted by myself shows sinus rhythm at 85 bpm. Narrow QRS, normal axis, Larson normal intervals besides a slightly prolonged PR interval consistent with first-degree A-V block. Nonspecific but no concerning ST changes.  ____________________________________________    RADIOLOGY  Chest x-ray negative  ____________________________________________   INITIAL IMPRESSION / ASSESSMENT AND PLAN / ED COURSE  Pertinent labs & imaging results that were available during my care of the patient were reviewed by me and considered in my medical decision making (see chart for details).  Patient presents to the emergency department with chest discomfort which started last night with the patient states feels like gas or indigestion which she has a history of. States the discomfort was still present this morning so he came to the emergency department as a precaution. States the pain is largely resolved. Patient's labs are largely within normal limits including a  negative troponin. Chest x-ray is normal. We'll obtain a repeat troponin treat with a GI cocktail and closely monitor. Patient sees Dr. Nehemiah Massed.  Patient states completely symptom-free at this time. Repeat troponin is negative. Patient will call his cardiologist today to arrange a follow-up appointment.  ____________________________________________   FINAL CLINICAL IMPRESSION(S) / ED DIAGNOSES  Chest pain    Harvest Dark, MD 04/01/16 [phone]

## 2016-04-01 NOTE — ED Triage Notes (Signed)
Pt to ED from home c/o chest pain since last night while lying in bed.  States squeezing pain left and right chest, non radiating, denies SOB, denies n/v/d.  States feels like gas, hx of acid reflux.  Pt presents A&Ox4, speaking in complete and coherent sentences, chest rise even and unlabored, skin warm and dry.  Pt see Dr. Louisa Second.

## 2016-04-01 NOTE — Discharge Instructions (Signed)
You have been seen in the emergency department today for chest pain. Your workup has shown normal results. As we discussed please follow-up with your primary care physician in the next 1-2 days for recheck. Return to the emergency department for any further chest pain, trouble breathing, or any other symptom personally concerning to yourself. °

## 2016-04-07 DIAGNOSIS — I2089 Other forms of angina pectoris: Secondary | ICD-10-CM | POA: Diagnosis present

## 2016-04-07 DIAGNOSIS — I208 Other forms of angina pectoris: Secondary | ICD-10-CM | POA: Diagnosis present

## 2016-04-14 ENCOUNTER — Ambulatory Visit: Admit: 2016-04-14 | Payer: Medicare Other | Admitting: Internal Medicine

## 2016-04-14 SURGERY — LEFT HEART CATH AND CORONARY ANGIOGRAPHY
Anesthesia: Moderate Sedation

## 2016-04-28 NOTE — ED Provider Notes (Signed)
[facility]    CSN: [id number] Arrival date & time: 02/09/16  1000     History   Chief Complaint Chief Complaint  Patient presents with  . Back Pain    HPI Billy KUMPF is a 76 y.o. male.   Patient also complains of slight penile discharge and itching. Denies any fevers, chills, dysuria, rash, or sexual intercourse.   The history is provided by the patient.  Back Pain  Location:  Lumbar spine and gluteal region Radiates to:  R thigh Pain severity:  Moderate Pain is:  Same all the time Onset quality:  Sudden Duration:  2 days Timing:  Constant Progression:  Unchanged Chronicity:  New Context: not emotional stress, not falling, not jumping from heights, not lifting heavy objects, not MCA, not MVA, not occupational injury, not pedestrian accident, not physical stress, not recent illness, not recent injury and not twisting   Relieved by:  None tried Associated symptoms: no abdominal pain, no abdominal swelling, no bladder incontinence, no bowel incontinence, no chest pain, no dysuria, no fever, no headaches, no leg pain, no numbness, no paresthesias, no pelvic pain, no perianal numbness, no tingling, no weakness and no weight loss     Past Medical History:  Diagnosis Date  . Cancer [facility])    prostate  . Cataract   . Elevated lipids   . GERD (gastroesophageal reflux disease)   . HOH (hard of hearing)   . Hypertension   . Pneumonia   . Pulmonary nodules/lesions, multiple   . Shortness of breath dyspnea     Patient Active Problem List   Diagnosis Date Noted  . Stable angina ([place]) 04/07/2016    Past Surgical History:  Procedure Laterality Date  . BACK SURGERY     discectomy ,reports right foot drop since surgery   . CATARACT EXTRACTION W/PHACO Left 04/22/2015   Procedure: CATARACT EXTRACTION PHACO AND INTRAOCULAR LENS PLACEMENT (IOC);  Surgeon: Birder Robson, MD;  Location: [facility] ORS;  Service: Ophthalmology;  Laterality: Left;  [place]     1:17.8 AP%    25.7 CDE    20.03 fluid pack lot # [id number]  . COLONOSCOPY  2009  . COLONOSCOPY WITH PROPOFOL N/A 03/24/2016   Procedure: COLONOSCOPY WITH PROPOFOL;  Surgeon: Christene Lye, MD;  Location: [facility] ENDOSCOPY;  Service: Endoscopy;  Laterality: N/A;  . ESOPHAGOGASTRODUODENOSCOPY (EGD) WITH PROPOFOL N/A 03/24/2016   Procedure: ESOPHAGOGASTRODUODENOSCOPY (EGD) WITH PROPOFOL;  Surgeon: Christene Lye, MD;  Location: [facility] ENDOSCOPY;  Service: Endoscopy;  Laterality: N/A;       Home Medications    Prior to Admission medications   Medication Sig Start Date End Date Taking? Authorizing Provider  aspirin EC 81 MG tablet Take 81 mg by mouth daily.   Yes Historical Provider, MD  losartan (COZAAR) 100 MG tablet Take 100 mg by mouth daily.    Yes Historical Provider, MD  omeprazole (PRILOSEC) 20 MG capsule Take 20 mg by mouth daily.   Yes Historical Provider, MD  traMADol (ULTRAM) 50 MG tablet Take 1 tablet (50 mg total) by mouth every 6 (six) hours as needed. 01/25/16  Yes Jami L Hagler, PA-C  vitamin B-12 (CYANOCOBALAMIN) 500 MCG tablet Take 500 mcg by mouth daily.   Yes Historical Provider, MD  amLODipine (NORVASC) 10 MG tablet Take 10 mg by mouth daily.    Historical Provider, MD  doxazosin (CARDURA) 1 MG tablet Take 1 mg by mouth daily.    Historical Provider, MD  methocarbamol (ROBAXIN) 500 MG  tablet Take 500 mg by mouth 2 (two) times daily.    Historical Provider, MD  polyethylene glycol powder (GLYCOLAX/MIRALAX) powder 255 grams one bottle for colonoscopy prep Patient not taking: Reported on 04/01/2016 02/18/16   Seeplaputhur Robinette Haines, MD  predniSONE (DELTASONE) 20 MG tablet Take 1 tablet (20 mg total) by mouth daily. Patient not taking: Reported on 04/01/2016 02/09/16   Norval Gable, MD    Family History History reviewed. No pertinent family history.  Social History Social History  Substance Use Topics  . Smoking status: Current Every Day Smoker    Packs/day: 1.00    Years:  60.00    Types: Cigarettes  . Smokeless tobacco: Never Used  . Alcohol use No     Allergies   Tramadol   Review of Systems Review of Systems  Constitutional: Negative for fever and weight loss.  Cardiovascular: Negative for chest pain.  Gastrointestinal: Negative for abdominal pain and bowel incontinence.  Genitourinary: Negative for bladder incontinence, dysuria and pelvic pain.  Musculoskeletal: Positive for back pain.  Neurological: Negative for tingling, weakness, numbness, headaches and paresthesias.     Physical Exam Triage Vital Signs ED Triage Vitals  Enc Vitals Group     BP 02/09/16 1050 (!) 188/85     Pulse Rate 02/09/16 1050 67     Resp 02/09/16 1050 18     Temp 02/09/16 1050 98 F (36.7 C)     Temp Source 02/09/16 1050 Oral     SpO2 02/09/16 1050 100 %     Weight 02/09/16 1050 185 lb (83.9 kg)     Height 02/09/16 1050 6' (1.829 m)     Head Circumference --      Peak Flow --      Pain Score 02/09/16 1052 2     Pain Loc --      Pain Edu? --      Excl. in [place]? --    No data found.   Updated Vital Signs BP (!) 188/85 (BP Location: Left Arm)   Pulse 67   Temp 98 F (36.7 C) (Oral)   Resp 18   Ht 6' (1.829 m)   Wt 185 lb (83.9 kg)   SpO2 100%   BMI 25.09 kg/m   Visual Acuity Right Eye Distance:   Left Eye Distance:   Bilateral Distance:    Right Eye Near:   Left Eye Near:    Bilateral Near:     Physical Exam   UC Treatments / Results  Labs (all labs ordered are listed, but only abnormal results are displayed) Labs Reviewed  URINALYSIS, COMPLETE (UACMP) WITH MICROSCOPIC - Abnormal; Notable for the following:       Result Value   Squamous Epithelial / LPF 0-5 (*)    All other components within normal limits  CHLAMYDIA/NGC RT PCR ([facility] ONLY)    EKG  EKG Interpretation None       Radiology No results found.  Procedures Procedures (including critical care time)  Medications Ordered in UC Medications - No data to  display   Initial Impression / Assessment and Plan / UC Course  I have reviewed the triage vital signs and the nursing notes.  Pertinent labs & imaging results that were available during my care of the patient were reviewed by me and considered in my medical decision making (see chart for details).       Final Clinical Impressions(s) / UC Diagnoses   Final diagnoses:  Sciatica of right side  Penile discharge    New Prescriptions Discharge Medication List as of 02/09/2016 12:08 PM    START taking these medications   Details  predniSONE (DELTASONE) 20 MG tablet Take 1 tablet (20 mg total) by mouth daily., Starting Mon 02/09/2016, Print       1. Lab results and diagnosis reviewed with patient 2. rx as per orders above; reviewed possible side effects, interactions, risks and benefits  3. Recommend supportive treatment with otc analgesics 4. Recommend checking tests for GC/chlamydia as per orders 5. Follow-up prn if symptoms worsen or don't improve   Norval Gable, MD 04/28/16 2029

## 2016-05-11 ENCOUNTER — Encounter: Payer: Self-pay | Admitting: General Surgery

## 2016-05-11 ENCOUNTER — Encounter: Admission: RE | Payer: Self-pay | Source: Ambulatory Visit

## 2016-05-11 ENCOUNTER — Ambulatory Visit: Admission: RE | Admit: 2016-05-11 | Payer: Medicare Other | Source: Ambulatory Visit | Admitting: Internal Medicine

## 2016-05-11 SURGERY — LEFT HEART CATH AND CORONARY ANGIOGRAPHY
Anesthesia: Moderate Sedation | Laterality: Left

## 2016-05-26 ENCOUNTER — Ambulatory Visit
Admission: EM | Admit: 2016-05-26 | Discharge: 2016-05-26 | Disposition: A | Discharge status: Home / Self Care | Payer: Medicare Other | Source: Ambulatory Visit | Attending: Internal Medicine | Admitting: Internal Medicine

## 2016-05-26 ENCOUNTER — Encounter
Admission: EM | Disposition: A | Discharge status: Home / Self Care | Payer: Self-pay | Source: Ambulatory Visit | Attending: Internal Medicine

## 2016-05-26 DIAGNOSIS — Z7982 Long term (current) use of aspirin: Secondary | ICD-10-CM | POA: Diagnosis not present

## 2016-05-26 DIAGNOSIS — I25118 Atherosclerotic heart disease of native coronary artery with other forms of angina pectoris: Secondary | ICD-10-CM | POA: Diagnosis not present

## 2016-05-26 DIAGNOSIS — Z7952 Long term (current) use of systemic steroids: Secondary | ICD-10-CM | POA: Insufficient documentation

## 2016-05-26 DIAGNOSIS — I2510 Atherosclerotic heart disease of native coronary artery without angina pectoris: Secondary | ICD-10-CM | POA: Diagnosis present

## 2016-05-26 DIAGNOSIS — I739 Peripheral vascular disease, unspecified: Secondary | ICD-10-CM | POA: Diagnosis not present

## 2016-05-26 DIAGNOSIS — J449 Chronic obstructive pulmonary disease, unspecified: Secondary | ICD-10-CM | POA: Diagnosis not present

## 2016-05-26 DIAGNOSIS — I714 Abdominal aortic aneurysm, without rupture: Secondary | ICD-10-CM | POA: Diagnosis not present

## 2016-05-26 DIAGNOSIS — I10 Essential (primary) hypertension: Secondary | ICD-10-CM | POA: Diagnosis not present

## 2016-05-26 DIAGNOSIS — I2089 Other forms of angina pectoris: Secondary | ICD-10-CM | POA: Diagnosis present

## 2016-05-26 DIAGNOSIS — E7800 Pure hypercholesterolemia, unspecified: Secondary | ICD-10-CM | POA: Insufficient documentation

## 2016-05-26 DIAGNOSIS — K219 Gastro-esophageal reflux disease without esophagitis: Secondary | ICD-10-CM | POA: Diagnosis not present

## 2016-05-26 DIAGNOSIS — I6523 Occlusion and stenosis of bilateral carotid arteries: Secondary | ICD-10-CM | POA: Diagnosis not present

## 2016-05-26 DIAGNOSIS — I208 Other forms of angina pectoris: Secondary | ICD-10-CM | POA: Diagnosis present

## 2016-05-26 HISTORY — PX: LEFT HEART CATH AND CORONARY ANGIOGRAPHY: CATH118249

## 2016-05-26 SURGERY — LEFT HEART CATH AND CORONARY ANGIOGRAPHY
Anesthesia: Moderate Sedation | Laterality: Left

## 2016-05-26 MED ORDER — ONDANSETRON HCL 4 MG/2ML IJ SOLN: 4.0000 mg | Freq: Four times a day (QID) | INTRAMUSCULAR | Status: DC | PRN

## 2016-05-26 MED ORDER — SODIUM CHLORIDE 0.9% FLUSH: 3.0000 mL | Freq: Two times a day (BID) | INTRAVENOUS | Status: DC

## 2016-05-26 MED ORDER — ASPIRIN 81 MG PO CHEW: 81.0000 mg | CHEWABLE_TABLET | ORAL | Status: DC

## 2016-05-26 MED ORDER — ACETAMINOPHEN 325 MG PO TABS: 650.0000 mg | ORAL_TABLET | ORAL | Status: DC | PRN

## 2016-05-26 MED ORDER — FENTANYL CITRATE (PF) 100 MCG/2ML IJ SOLN
INTRAMUSCULAR | Status: AC
  Filled 2016-05-26: qty 2

## 2016-05-26 MED ORDER — SODIUM CHLORIDE 0.9 % IV SOLN: 250.0000 mL | INTRAVENOUS | Status: DC | PRN

## 2016-05-26 MED ORDER — SODIUM CHLORIDE 0.9% FLUSH: 3.0000 mL | INTRAVENOUS | Status: DC | PRN

## 2016-05-26 MED ORDER — FENTANYL CITRATE (PF) 100 MCG/2ML IJ SOLN
INTRAMUSCULAR | Status: DC | PRN
  Administered 2016-05-26: 25 ug via INTRAVENOUS

## 2016-05-26 MED ORDER — LIDOCAINE HCL (PF) 1 % IJ SOLN
INTRAMUSCULAR | Status: DC | PRN
  Administered 2016-05-26: 14 mL

## 2016-05-26 MED ORDER — SODIUM CHLORIDE 0.9 % IV SOLN
250.0000 mL | INTRAVENOUS | Status: DC | PRN
Start: 2016-05-27 — End: 2016-05-26

## 2016-05-26 MED ORDER — IOPAMIDOL (ISOVUE-300) INJECTION 61%
INTRAVENOUS | Status: DC | PRN
  Administered 2016-05-26: 155 mL via INTRA_ARTERIAL

## 2016-05-26 MED ORDER — SODIUM CHLORIDE 0.9 % WEIGHT BASED INFUSION: 3.0000 mL/kg/h | INTRAVENOUS | Status: DC

## 2016-05-26 MED ORDER — LIDOCAINE HCL (PF) 1 % IJ SOLN
INTRAMUSCULAR | Status: AC
  Filled 2016-05-26: qty 30

## 2016-05-26 MED ORDER — SODIUM CHLORIDE 0.9 % WEIGHT BASED INFUSION
1.0000 mL/kg/h | INTRAVENOUS | Status: DC
Start: 2016-05-26 — End: 2016-05-26

## 2016-05-26 MED ORDER — HEPARIN (PORCINE) IN NACL 2-0.9 UNIT/ML-% IJ SOLN
INTRAMUSCULAR | Status: AC
  Filled 2016-05-26: qty 1000

## 2016-05-26 MED ORDER — SODIUM CHLORIDE 0.9 % WEIGHT BASED INFUSION: 1.0000 mL/kg/h | INTRAVENOUS | Status: DC

## 2016-05-26 MED ORDER — MIDAZOLAM HCL 2 MG/2ML IJ SOLN
INTRAMUSCULAR | Status: DC | PRN
  Administered 2016-05-26: 1 mg via INTRAVENOUS

## 2016-05-26 MED ORDER — MIDAZOLAM HCL 2 MG/2ML IJ SOLN
INTRAMUSCULAR | Status: AC
  Filled 2016-05-26: qty 2

## 2016-05-26 SURGICAL SUPPLY — 9 items
CATH 5FR PIGTAIL DIAGNOSTIC (CATHETERS) ×3 IMPLANT
CATH INFINITI 5FR JL4 (CATHETERS) ×3 IMPLANT
CATH INFINITI JR4 5F (CATHETERS) ×3 IMPLANT
KIT MANI 3VAL PERCEP (MISCELLANEOUS) ×3 IMPLANT
NEEDLE PERC 18GX7CM (NEEDLE) ×3 IMPLANT
PACK CARDIAC CATH (CUSTOM PROCEDURE TRAY) ×3 IMPLANT
SHEATH PINNACLE 5F 10CM (SHEATH) ×3 IMPLANT
WIRE EMERALD 3MM-J .035X150CM (WIRE) ×3 IMPLANT
WIRE HITORQ VERSACORE ST 145CM (WIRE) ×3 IMPLANT

## 2016-05-26 NOTE — Progress Notes (Signed)
Patient's son called back and discussed the importance of having someone accompany patient to follow up appointment per patient request. Was assured by patient and patients son that someone would be able to transport and accompany the patient to scheduled follow up appointment on Monday.

## 2016-05-26 NOTE — Progress Notes (Signed)
Per Dr Nehemiah Massed and patient report, patient has transportation barriers that have made it challenging to keep past appointments.  Upon discussing importance of follow up appointment with patient, he requested that I reach out to his son (listed in emergency contacts) to notify him of follow up appointment scheduled for patient. Called son and left voicemail message for callback. Awaiting callback.

## 2016-05-27 ENCOUNTER — Encounter: Payer: Self-pay | Admitting: Internal Medicine

## 2016-06-11 DIAGNOSIS — Z951 Presence of aortocoronary bypass graft: Secondary | ICD-10-CM | POA: Insufficient documentation

## 2016-06-11 DIAGNOSIS — D62 Acute posthemorrhagic anemia: Secondary | ICD-10-CM | POA: Insufficient documentation

## 2017-03-03 DIAGNOSIS — N281 Cyst of kidney, acquired: Secondary | ICD-10-CM | POA: Insufficient documentation

## 2017-03-03 DIAGNOSIS — Z87442 Personal history of urinary calculi: Secondary | ICD-10-CM | POA: Insufficient documentation

## 2017-03-25 ENCOUNTER — Other Ambulatory Visit: Payer: Self-pay

## 2017-03-25 ENCOUNTER — Encounter: Payer: Self-pay | Admitting: Emergency Medicine

## 2017-03-25 ENCOUNTER — Ambulatory Visit
Admission: EM | Admit: 2017-03-25 | Discharge: 2017-03-25 | Disposition: A | Discharge status: Home / Self Care | Payer: Medicare Other | Attending: Family Medicine | Admitting: Family Medicine

## 2017-03-25 DIAGNOSIS — B354 Tinea corporis: Secondary | ICD-10-CM

## 2017-03-25 MED ORDER — FLUCONAZOLE 150 MG PO TABS: 150.0000 mg | ORAL_TABLET | Freq: Every day | ORAL | 0 refills | Status: DC

## 2017-03-25 NOTE — ED Provider Notes (Signed)
[facility]    CSN: [id number] Arrival date & time: 03/25/17  1413     History   Chief Complaint Chief Complaint  Patient presents with  . Rash    HPI Billy Ortiz is a 77 y.o. male.   77 yo male with a c/o an itchy rash on the arms and abdomen for 1 week. Denies any fevers, chills, pain, drainage, plant or chemical contacts, new soaps or detergents. State he started applying over the counter antifungal cream 2 days ago but "rash in not gone".    The history is provided by the patient.  Rash    Past Medical History:  Diagnosis Date  . Cancer [facility])    prostate  . Cataract   . Elevated lipids   . GERD (gastroesophageal reflux disease)   . HOH (hard of hearing)   . Hypertension   . Pneumonia   . Pulmonary nodules/lesions, multiple   . Shortness of breath dyspnea     Patient Active Problem List   Diagnosis Date Noted  . Stable angina ([place]) 04/07/2016    Past Surgical History:  Procedure Laterality Date  . BACK SURGERY     discectomy ,reports right foot drop since surgery   . CATARACT EXTRACTION W/PHACO Left 04/22/2015   Procedure: CATARACT EXTRACTION PHACO AND INTRAOCULAR LENS PLACEMENT (IOC);  Surgeon: Birder Robson, MD;  Location: [facility] ORS;  Service: Ophthalmology;  Laterality: Left;  [place]     1:17.8 AP%   25.7 CDE    20.03 fluid pack lot # [id number]  . COLONOSCOPY  2009  . COLONOSCOPY WITH PROPOFOL N/A 03/24/2016   Procedure: COLONOSCOPY WITH PROPOFOL;  Surgeon: Christene Lye, MD;  Location: [facility] END[facility];  Service: Endoscopy;  Laterality: N/A;  . ESOPHAGOGASTRODUODENOSCOPY (EGD) WITH PROPOFOL N/A 03/24/2016   Procedure: ESOPHAGOGASTRODUODENOSCOPY (EGD) WITH PROPOFOL;  Surgeon: Christene Lye, MD;  Location: [facility] END[facility];  Service: Endoscopy;  Laterality: N/A;  . LEFT HEART CATH AND CORONARY ANGIOGRAPHY Left 05/26/2016   Procedure: Left Heart Cath and Coronary Angiography;  Surgeon: Corey Skains, MD;  Location: [place]  CV LAB;  Service: Cardiovascular;  Laterality: Left;       Home Medications    Prior to Admission medications   Medication Sig Start Date End Date Taking? Authorizing Provider  amLODipine (NORVASC) 10 MG tablet Take 10 mg by mouth daily.   Yes [provider]  aspirin EC 81 MG tablet Take 81 mg by mouth daily.   Yes [provider]  atorvastatin (LIPITOR) 40 MG tablet Take 40 mg by mouth daily.   Yes [provider]  clopidogrel (PLAVIX) 75 MG tablet Take 75 mg by mouth daily.   Yes [provider]  doxazosin (CARDURA) 1 MG tablet Take 1 mg by mouth daily.   Yes [provider]  losartan (COZAAR) 100 MG tablet Take 100 mg by mouth daily.    Yes [provider]  methocarbamol (ROBAXIN) 500 MG tablet Take 500 mg by mouth 2 (two) times daily.   Yes [provider]  nitroGLYCERIN (NITROSTAT) 0.4 MG SL tablet Place 0.4 mg under the tongue every 5 (five) minutes as needed for chest pain.   Yes [provider]  omeprazole (PRILOSEC) 20 MG capsule Take 20 mg by mouth daily.   Yes [provider]  polyethylene glycol powder (GLYCOLAX/MIRALAX) powder 255 grams one bottle for colonoscopy prep 02/18/16  Yes Sankar, Seeplaputhur G, MD  torsemide (DEMADEX) 20 MG tablet Take 20 mg  by mouth daily. Take on M, W, F if weight is >170lb   Yes [provider]  vitamin B-12 (CYANOCOBALAMIN) 500 MCG tablet Take 500 mcg by mouth daily.   Yes [provider]  fluconazole (DIFLUCAN) 150 MG tablet Take 1 tablet (150 mg total) by mouth daily. 03/25/17   Norval Gable, MD    Family History Family History  Family history unknown: Yes    Social History Social History   Tobacco Use  . Smoking status: Current Every Day Smoker    Packs/day: 1.00    Years: 60.00    Pack years: 60.00    Types: Cigarettes  . Smokeless tobacco: Never Used  Substance Use Topics  . Alcohol use: No  . Drug use: No     Allergies     Tramadol   Review of Systems Review of Systems  Skin: Positive for rash.     Physical Exam Triage Vital Signs ED Triage Vitals  Enc Vitals Group     BP 03/25/17 1430 (!) 162/80     Pulse Rate 03/25/17 1430 66     Resp 03/25/17 1430 16     Temp 03/25/17 1430 98.2 F (36.8 C)     Temp Source 03/25/17 1430 Oral     SpO2 03/25/17 1430 96 %     Weight 03/25/17 1429 166 lb (75.3 kg)     Height 03/25/17 1429 6' (1.829 m)     Head Circumference --      Peak Flow --      Pain Score 03/25/17 1430 0     Pain Loc --      Pain Edu? --      Excl. in [place]? --    No data found.  Updated Vital Signs BP (!) 162/80 (BP Location: Right Arm)   Pulse 66   Temp 98.2 F (36.8 C) (Oral)   Resp 16   Ht 6' (1.829 m)   Wt 166 lb (75.3 kg)   SpO2 96%   BMI 22.51 kg/m   Visual Acuity Right Eye Distance:   Left Eye Distance:   Bilateral Distance:    Right Eye Near:   Left Eye Near:    Bilateral Near:     Physical Exam  Constitutional: He appears well-developed and well-nourished. No distress.  Skin: Rash noted. He is not diaphoretic. There is erythema.  Scaly, erythematous patches on arms and trunk  Nursing note and vitals reviewed.    UC Treatments / Results  Labs (all labs ordered are listed, but only abnormal results are displayed) Labs Reviewed - No data to display  EKG  EKG Interpretation None       Radiology No results found.  Procedures Procedures (including critical care time)  Medications Ordered in UC Medications - No data to display   Initial Impression / Assessment and Plan / UC Course  I have reviewed the triage vital signs and the nursing notes.  Pertinent labs & imaging results that were available during my care of the patient were reviewed by me and considered in my medical decision making (see chart for details).       Final Clinical Impressions(s) / UC Diagnoses   Final diagnoses:  Tinea corporis    ED Discharge Orders         Ordered    fluconazole (DIFLUCAN) 150 MG tablet  Daily     03/25/17 1500     1. diagnosis reviewed with patient 2. rx as per orders above;  reviewed possible side effects, interactions, risks and benefits  3. Recommend continue with otc antifungal cream bid 4. Follow-up prn if symptoms worsen or don't improve  Controlled Substance Prescriptions [place] Controlled Substance Registry consulted? Not Applicable   Norval Gable, MD 03/25/17 [phone]

## 2017-03-25 NOTE — ED Triage Notes (Signed)
Patient in today c/o rash on arms and abdomen. Saw cardiologist yesterday and they looked at rash and thought it might be ring worm.

## 2017-04-11 DIAGNOSIS — I739 Peripheral vascular disease, unspecified: Secondary | ICD-10-CM | POA: Insufficient documentation

## 2017-04-11 DIAGNOSIS — K5903 Drug induced constipation: Secondary | ICD-10-CM | POA: Insufficient documentation

## 2017-04-11 DIAGNOSIS — R21 Rash and other nonspecific skin eruption: Secondary | ICD-10-CM | POA: Insufficient documentation

## 2017-04-11 DIAGNOSIS — I5030 Unspecified diastolic (congestive) heart failure: Secondary | ICD-10-CM | POA: Insufficient documentation

## 2017-04-13 ENCOUNTER — Other Ambulatory Visit: Payer: Self-pay | Admitting: Specialist

## 2017-04-13 DIAGNOSIS — R911 Solitary pulmonary nodule: Secondary | ICD-10-CM

## 2017-04-25 ENCOUNTER — Other Ambulatory Visit: Payer: Self-pay | Admitting: Specialist

## 2017-04-25 ENCOUNTER — Ambulatory Visit
Admission: RE | Admit: 2017-04-25 | Discharge: 2017-04-25 | Disposition: A | Discharge status: Home / Self Care | Payer: Medicare Other | Source: Ambulatory Visit | Attending: Specialist | Admitting: Specialist

## 2017-04-25 DIAGNOSIS — R599 Enlarged lymph nodes, unspecified: Secondary | ICD-10-CM | POA: Diagnosis not present

## 2017-04-25 DIAGNOSIS — J432 Centrilobular emphysema: Secondary | ICD-10-CM | POA: Insufficient documentation

## 2017-04-25 DIAGNOSIS — R918 Other nonspecific abnormal finding of lung field: Secondary | ICD-10-CM

## 2017-04-25 DIAGNOSIS — Z9889 Other specified postprocedural states: Secondary | ICD-10-CM | POA: Insufficient documentation

## 2017-04-25 DIAGNOSIS — R911 Solitary pulmonary nodule: Secondary | ICD-10-CM

## 2017-04-25 DIAGNOSIS — I700 Atherosclerosis of aorta: Secondary | ICD-10-CM | POA: Diagnosis not present

## 2017-05-04 ENCOUNTER — Emergency Department: Payer: Medicare Other

## 2017-05-04 ENCOUNTER — Encounter: Payer: Self-pay | Admitting: Emergency Medicine

## 2017-05-04 ENCOUNTER — Other Ambulatory Visit: Payer: Self-pay

## 2017-05-04 ENCOUNTER — Emergency Department
Admission: EM | Admit: 2017-05-04 | Discharge: 2017-05-04 | Disposition: A | Discharge status: Home / Self Care | Payer: Medicare Other | Attending: Emergency Medicine | Admitting: Emergency Medicine

## 2017-05-04 DIAGNOSIS — R05 Cough: Secondary | ICD-10-CM | POA: Insufficient documentation

## 2017-05-04 DIAGNOSIS — I10 Essential (primary) hypertension: Secondary | ICD-10-CM | POA: Diagnosis not present

## 2017-05-04 DIAGNOSIS — R059 Cough, unspecified: Secondary | ICD-10-CM

## 2017-05-04 DIAGNOSIS — Z79899 Other long term (current) drug therapy: Secondary | ICD-10-CM | POA: Insufficient documentation

## 2017-05-04 DIAGNOSIS — Z7982 Long term (current) use of aspirin: Secondary | ICD-10-CM | POA: Diagnosis not present

## 2017-05-04 LAB — BASIC METABOLIC PANEL
ANION GAP: 8 (ref 5–15)
BUN: 12 mg/dL (ref 6–20)
CALCIUM: 9.2 mg/dL (ref 8.9–10.3)
CO2: 24 mmol/L (ref 22–32)
CREATININE: 1.86 mg/dL — AB (ref 0.61–1.24)
Chloride: 105 mmol/L (ref 101–111)
GFR, EST AFRICAN AMERICAN: 39 mL/min — AB (ref >60)
GFR, EST NON AFRICAN AMERICAN: 34 mL/min — AB (ref >60)
GLUCOSE: 78 mg/dL (ref 65–99)
Potassium: 3.7 mmol/L (ref 3.5–5.1)
Sodium: 137 mmol/L (ref 135–145)

## 2017-05-04 LAB — CBC
HCT: 41.4 % (ref 40.0–52.0)
HEMOGLOBIN: 13.1 g/dL (ref 13.0–18.0)
MCH: 23.8 pg — AB (ref 26.0–34.0)
MCHC: 31.6 g/dL — AB (ref 32.0–36.0)
MCV: 75.1 fL — ABNORMAL LOW (ref 80.0–100.0)
PLATELETS: 170 10*3/uL (ref 150–440)
RBC: 5.52 MIL/uL (ref 4.40–5.90)
RDW: 14.5 % (ref 11.5–14.5)
WBC: 6.3 10*3/uL (ref 3.8–10.6)

## 2017-05-04 LAB — TROPONIN I

## 2017-05-04 MED ORDER — PREDNISONE 20 MG PO TABS: 40.0000 mg | ORAL_TABLET | Freq: Every day | ORAL | 0 refills | Status: DC

## 2017-05-04 NOTE — ED Notes (Signed)
Pt anxious to leave, reports dyspnea with rest worse when lying down (uses 2 pillows and elevated bed)

## 2017-05-04 NOTE — ED Triage Notes (Signed)
Pt in via POV, states, "I think I have Pneumonia, I have been tight in my chest, coughing for three days, and weak.  Pt ambulatory to triage room, NAD noted at this time.

## 2017-05-04 NOTE — ED Provider Notes (Addendum)
[facility] Emergency Department Provider Note  Time seen: 3:06 PM  I have reviewed the triage vital signs and the nursing notes.   HISTORY  Chief Complaint Chest Pain    HPI Billy Ortiz is a 77 y.o. male with a past medical history of gastric reflux, hypertension, presents to the emergency department for cough and lightheadedness.  According to the patient for the past several months he has been coughing, states especially when he takes a deep breath he gets a tickle and has to cough.  Today while coughing he felt lightheaded so he took his blood pressure and it was elevated so he came to the emergency department for evaluation.  Patient states he has been following up with his pulmonologist Dr. Raul Del for the same issues, had a CT scan performed approximately 10 days ago but is not yet heard the results.  Patient denies any congestion or fever.  Patient is a daily smoker.  Largely negative review of systems otherwise.   Past Medical History:  Diagnosis Date  . Cancer [facility])    prostate  . Cataract   . Elevated lipids   . GERD (gastroesophageal reflux disease)   . HOH (hard of hearing)   . Hypertension   . Pneumonia   . Pulmonary nodules/lesions, multiple   . Shortness of breath dyspnea     Patient Active Problem List   Diagnosis Date Noted  . Stable angina ([place]) 04/07/2016    Past Surgical History:  Procedure Laterality Date  . BACK SURGERY     discectomy ,reports right foot drop since surgery   . CATARACT EXTRACTION W/PHACO Left 04/22/2015   Procedure: CATARACT EXTRACTION PHACO AND INTRAOCULAR LENS PLACEMENT (IOC);  Surgeon: Birder Robson, MD;  Location: [facility] ORS;  Service: Ophthalmology;  Laterality: Left;  [place]     1:17.8 AP%   25.7 CDE    20.03 fluid pack lot # [id number]  . COLONOSCOPY  2009  . COLONOSCOPY WITH PROPOFOL N/A 03/24/2016   Procedure: COLONOSCOPY WITH PROPOFOL;  Surgeon: Christene Lye, MD;  Location: [facility] END[facility];   Service: Endoscopy;  Laterality: N/A;  . ESOPHAGOGASTRODUODENOSCOPY (EGD) WITH PROPOFOL N/A 03/24/2016   Procedure: ESOPHAGOGASTRODUODENOSCOPY (EGD) WITH PROPOFOL;  Surgeon: Christene Lye, MD;  Location: [facility] END[facility];  Service: Endoscopy;  Laterality: N/A;  . LEFT HEART CATH AND CORONARY ANGIOGRAPHY Left 05/26/2016   Procedure: Left Heart Cath and Coronary Angiography;  Surgeon: Corey Skains, MD;  Location: [place] CV LAB;  Service: Cardiovascular;  Laterality: Left;    Prior to Admission medications   Medication Sig Start Date End Date Taking? Authorizing Provider  amLODipine (NORVASC) 10 MG tablet Take 10 mg by mouth daily.    [provider]  aspirin EC 81 MG tablet Take 81 mg by mouth daily.    [provider]  atorvastatin (LIPITOR) 40 MG tablet Take 40 mg by mouth daily.    [provider]  clopidogrel (PLAVIX) 75 MG tablet Take 75 mg by mouth daily.    [provider]  doxazosin (CARDURA) 1 MG tablet Take 1 mg by mouth daily.    [provider]  fluconazole (DIFLUCAN) 150 MG tablet Take 1 tablet (150 mg total) by mouth daily. 03/25/17   Norval Gable, MD  losartan (COZAAR) 100 MG tablet Take 100 mg by mouth daily.     [provider]  methocarbamol (ROBAXIN) 500 MG tablet Take 500 mg by mouth 2 (two) times daily.    [provider]  nitroGLYCERIN (NITROSTAT) 0.4 MG SL tablet Place 0.4 mg under the tongue every 5 (five) minutes as needed for chest pain.    [provider]  omeprazole (PRILOSEC) 20 MG capsule Take 20 mg by mouth daily.    [provider]  polyethylene glycol powder (GLYCOLAX/MIRALAX) powder 255 grams one bottle for colonoscopy prep 02/18/16   Christene Lye, MD  torsemide (DEMADEX) 20 MG tablet Take 20 mg by mouth daily. Take on M, W, F if weight is >170lb    [provider]  vitamin B-12 (CYANOCOBALAMIN) 500 MCG tablet Take 500 mcg by mouth daily.     [provider]    Allergies  Allergen Reactions  . Tramadol Hives    Family History  Family history unknown: Yes    Social History Social History   Tobacco Use  . Smoking status: Current Every Day Smoker    Packs/day: 1.00    Years: 60.00    Pack years: 60.00    Types: Cigarettes  . Smokeless tobacco: Never Used  Substance Use Topics  . Alcohol use: No  . Drug use: No    Review of Systems Constitutional: Negative for fever. Eyes: Negative for visual complaints ENT: Negative for recent illness/congestion Cardiovascular: Mild chest tightness, ongoing for several weeks Respiratory: Positive for cough, ongoing for months Gastrointestinal: Negative for abdominal pain, vomiting Genitourinary: Negative for urinary compaints Musculoskeletal: Negative for leg pain or swelling. Skin: Negative for skin complaints  Neurological: Negative for headache All other ROS negative  ____________________________________________   PHYSICAL EXAM:  VITAL SIGNS: ED Triage Vitals  Enc Vitals Group     BP 05/04/17 1123 (!) 151/98     Pulse Rate 05/04/17 1123 83     Resp 05/04/17 1123 18     Temp 05/04/17 1123 98 F (36.7 C)     Temp Source 05/04/17 1123 Oral     SpO2 05/04/17 1123 99 %     Weight 05/04/17 1117 165 lb (74.8 kg)     Height 05/04/17 1117 6' (1.829 m)     Head Circumference --      Peak Flow --      Pain Score 05/04/17 1117 8     Pain Loc --      Pain Edu? --      Excl. in [place]? --    Constitutional: Alert and oriented. Well appearing and in no distress. Eyes: Normal exam ENT   Head: Normocephalic and atraumatic.   Mouth/Throat: Mucous membranes are moist. Cardiovascular: Normal rate, regular rhythm. No murmur Respiratory: Normal respiratory effort.  No tachypnea.  Mild expiratory wheeze bilaterally. Gastrointestinal: Soft and nontender. No distention.   Musculoskeletal: Nontender with normal range of motion in all extremities. No lower extremity  edema. Neurologic:  Normal speech and language. No gross focal neurologic deficits are appreciated. Skin:  Skin is warm, dry and intact.  Psychiatric: Mood and affect are normal. Speech and behavior are normal.   ____________________________________________    EKG  EKG reviewed and interpreted by myself shows sinus rhythm at 75 bpm with a narrow QRS, left axis deviation, slight PR prolongation consistent with first-degree AV block, nonspecific ST changes.  ____________________________________________    RADIOLOGY  X-ray shows no acute abnormality  ____________________________________________   INITIAL IMPRESSION / ASSESSMENT AND PLAN / ED COURSE  Pertinent labs & imaging results that were available during my care of the patient were reviewed by me and considered in my medical decision making (see chart  for details).  Patient presented to the emergency department with cough and lightheadedness as well as high blood pressure.  Differential would include pneumonia, upper respiratory infection, bronchitis, less likely ACS.  Patient's labs are largely at baseline, moderate renal insufficiency, slightly above baseline.  Negative troponin.  Chest x-ray shows no acute abnormality. 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  I highly suspect this is the cause of the patient's cough especially with deep inspiration per patient.  Patient is a daily smoker has mild wheeze bilaterally, we will placed on a short course of steroids.  Patient has follow-up with Dr. Raul Del tomorrow.  I discussed with the patient the need to talk to Dr. Raul Del tomorrow regarding likely bronchoscopy and biopsy.  Overall the patient appears extremely well.  We will discharge from the emergency department with steroids and pulmonology follow-up. ____________________________________________   FINAL CLINICAL IMPRESSION(S) / ED  DIAGNOSES  Cough Hypertension    Harvest Dark, MD 05/04/17 1510    Harvest Dark, MD 05/04/17 1511

## 2017-05-04 NOTE — Discharge Instructions (Signed)
As we discussed please talk to your pulmonologist tomorrow regarding your chest mass and further workup required.  Please take your steroids as prescribed.  Return to the emergency department for any chest pain, trouble breathing, or any other symptom personally concerning to yourself.

## 2017-05-10 ENCOUNTER — Encounter
Admission: RE | Admit: 2017-05-10 | Discharge: 2017-05-10 | Disposition: A | Discharge status: Home / Self Care | Payer: Medicare Other | Source: Ambulatory Visit | Attending: Specialist | Admitting: Specialist

## 2017-05-10 DIAGNOSIS — R911 Solitary pulmonary nodule: Secondary | ICD-10-CM

## 2017-05-10 DIAGNOSIS — R918 Other nonspecific abnormal finding of lung field: Secondary | ICD-10-CM

## 2017-05-10 LAB — GLUCOSE, CAPILLARY: GLUCOSE-CAPILLARY: 47 mg/dL — AB (ref 65–99)

## 2017-05-10 MED ORDER — FLUDEOXYGLUCOSE F - 18 (FDG) INJECTION
8.6000 | Freq: Once | INTRAVENOUS | Status: AC | PRN
  Administered 2017-05-10: 8.97 via INTRAVENOUS

## 2017-05-24 ENCOUNTER — Ambulatory Visit (INDEPENDENT_AMBULATORY_CARE_PROVIDER_SITE_OTHER): Payer: Medicare Other | Admitting: Internal Medicine

## 2017-05-24 ENCOUNTER — Other Ambulatory Visit: Payer: Self-pay | Admitting: Internal Medicine

## 2017-05-24 ENCOUNTER — Encounter: Payer: Self-pay | Admitting: Internal Medicine

## 2017-05-24 VITALS — BP 122/80 | HR 68 | Resp 16 | Ht 72.0 in | Wt 168.0 lb

## 2017-05-24 DIAGNOSIS — R590 Localized enlarged lymph nodes: Secondary | ICD-10-CM | POA: Diagnosis not present

## 2017-05-24 DIAGNOSIS — Z8546 Personal history of malignant neoplasm of prostate: Secondary | ICD-10-CM | POA: Insufficient documentation

## 2017-05-24 DIAGNOSIS — F17210 Nicotine dependence, cigarettes, uncomplicated: Secondary | ICD-10-CM

## 2017-05-24 DIAGNOSIS — J449 Chronic obstructive pulmonary disease, unspecified: Secondary | ICD-10-CM

## 2017-05-24 NOTE — Patient Instructions (Signed)
Follow-up one week after procedure.

## 2017-05-24 NOTE — Progress Notes (Signed)
[facility] Consultation      Assessment and Plan:  Mediastinal lymphadenopathy.  -Possible granulomatous disease such as sarcoidosis, histoplasmosis, versus indolent lymphoma such as CLL. - Discussed risks and benefits with patient, will arrange for EBUS bronchoscopy for diagnosis. -He is to stop Plavix 1 week before procedure.  COPD/Emphysema.  -Known COPD/emphysema, appears adequately controlled at this time without evidence of exacerbation.. - Continue Brio.  Nicotine abuse.  -He is not seriously considering quitting, discussed the importance of smoking cessation, spent greater than 3 minutes in discussion. -Also discussed that continued smoking will increase the risk of complications with bronchoscopy.   Date: 05/24/2017  MRN# [id number] Billy Ortiz September 20, 1940   Billy Ortiz is a 77 y.o. old male seen in consultation for chief complaint of:    Chief Complaint  Patient presents with  . Consult    Referred by Dr. Raul Ortiz for eval of possible EBUS  . Cough  . Shortness of Breath  . Wheezing  . Chest Pain    HPI:   The patient is a 77 year old male sees Dr. Raul Ortiz, he has a history of lung nodules as well as mediastinal lymphadenopathy.  He has had a history of hemoptysis, he had been on aspirin and Plavix at those times.  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  Review of his most recent labs shows calcium levels in the high normal to elevated range.  Images personally reviewed, CT chest 04/25/17; diffuse severe emphysematous change throughout both lungs, worst in the apices.  There is mild right paratracheal calcified lymphadenopathy mild right hilar adenopathy.  There is a large subcarinal and left hilar lymphadenopathy which appears predominantly posterior and superior to the left main stem bronchus. The patient's most recent CT chest report in comparison with previous  CT from 2016 shows both calcified and noncalcified nodes which appear progressive.  There is enlargement of subcarinal node, with a left retrohilar mass which causes narrowing of the left lower lobe bronchus centrally, which appears to be new.  He feels that he has been doing well overall, he feels that his breathing is "terrible" since his open heart surgery, he is not able to do much anymore in terms of activity. He was ambulated around the office today and did quite well without desat or dyspnea. He feels that he is not doing as much anymore due to his age. He has always [place] in this area [place].  He is smoking less than a ppd, he is not really thinking about quitting smoking.  He denies chest pain. He has been on plavix and is still on it.   Desat walk 05/24/17; baseline sat on RA at rest 97% and HR 70. Pt walked a total of 360 feet slow pace mild dyspnea. Sat was 94% and HR 74, minimal dyspnea.    PMHX:   Past Medical History:  Diagnosis Date  . Cancer [facility])    prostate  . Cataract   . Elevated lipids   . GERD (gastroesophageal reflux disease)   . HOH (hard of hearing)   . Hypertension   . Pneumonia   . Pulmonary nodules/lesions, multiple   . Shortness of breath dyspnea    Surgical Hx:  Past Surgical History:  Procedure Laterality Date  . BACK SURGERY     discectomy ,reports right foot drop since surgery   . CATARACT EXTRACTION W/PHACO Left 04/22/2015   Procedure: CATARACT  EXTRACTION PHACO AND INTRAOCULAR LENS PLACEMENT (IOC);  Surgeon: Birder Robson, MD;  Location: [facility] ORS;  Service: Ophthalmology;  Laterality: Left;  [place]     1:17.8 AP%   25.7 CDE    20.03 fluid pack lot # [id number]  . COLONOSCOPY  2009  . COLONOSCOPY WITH PROPOFOL N/A 03/24/2016   Procedure: COLONOSCOPY WITH PROPOFOL;  Surgeon: Christene Lye, MD;  Location: [facility] ENDOSCOPY;  Service: Endoscopy;  Laterality: N/A;  . ESOPHAGOGASTRODUODENOSCOPY (EGD) WITH PROPOFOL N/A 03/24/2016   Procedure:  ESOPHAGOGASTRODUODENOSCOPY (EGD) WITH PROPOFOL;  Surgeon: Christene Lye, MD;  Location: [facility] ENDOSCOPY;  Service: Endoscopy;  Laterality: N/A;  . LEFT HEART CATH AND CORONARY ANGIOGRAPHY Left 05/26/2016   Procedure: Left Heart Cath and Coronary Angiography;  Surgeon: Corey Skains, MD;  Location: [place] CV LAB;  Service: Cardiovascular;  Laterality: Left;   Family Hx:  Family History  Family history unknown: Yes   Social Hx:   Social History   Tobacco Use  . Smoking status: Current Every Day Smoker    Packs/day: 1.00    Years: 60.00    Pack years: 60.00    Types: Cigarettes  . Smokeless tobacco: Never Used  Substance Use Topics  . Alcohol use: No  . Drug use: No   Medication:    Current Outpatient Medications:  .  amLODipine (NORVASC) 10 MG tablet, Take 10 mg by mouth daily., Disp: , Rfl:  .  aspirin EC 81 MG tablet, Take 81 mg by mouth daily., Disp: , Rfl:  .  atorvastatin (LIPITOR) 40 MG tablet, Take 40 mg by mouth daily., Disp: , Rfl:  .  carvedilol (COREG) 3.125 MG tablet, Take 2 tablets by mouth 2 (two) times daily., Disp: , Rfl:  .  docusate sodium (COLACE) 100 MG capsule, Take 100 mg by mouth daily., Disp: , Rfl:  .  doxazosin (CARDURA) 1 MG tablet, Take 1 mg by mouth daily., Disp: , Rfl:  .  fluticasone furoate-vilanterol (BREO ELLIPTA) 100-25 MCG/INH AEPB, Inhale 1 puff into the lungs daily., Disp: , Rfl:  .  isosorbide mononitrate (IMDUR) 60 MG 24 hr tablet, Take 60 mg by mouth daily., Disp: , Rfl:  .  losartan (COZAAR) 100 MG tablet, Take 100 mg by mouth daily. , Disp: , Rfl:  .  methocarbamol (ROBAXIN) 500 MG tablet, Take 500 mg by mouth 2 (two) times daily., Disp: , Rfl:  .  nitroGLYCERIN (NITROSTAT) 0.4 MG SL tablet, Place 0.4 mg under the tongue every 5 (five) minutes as needed for chest pain., Disp: , Rfl:  .  omeprazole (PRILOSEC) 20 MG capsule, Take 20 mg by mouth daily., Disp: , Rfl:  .  polyethylene glycol powder (GLYCOLAX/MIRALAX) powder, 255  grams one bottle for colonoscopy prep, Disp: 255 g, Rfl: 0 .  torsemide (DEMADEX) 20 MG tablet, Take 20 mg by mouth daily. Take on M, W, F if weight is >170lb, Disp: , Rfl:  .  vitamin B-12 (CYANOCOBALAMIN) 500 MCG tablet, Take 500 mcg by mouth daily., Disp: , Rfl:    Allergies:  Tramadol  Review of Systems: Gen:  Denies  fever, sweats, chills HEENT: Denies blurred vision, double vision. bleeds, sore throat Cvc:  No dizziness, chest pain. Resp:   Denies cough or sputum production, shortness of breath Gi: Denies swallowing difficulty, stomach pain. Gu:  Denies bladder incontinence, burning urine Ext:   No Joint pain, stiffness. Skin: No skin rash,  hives  Endoc:  No polyuria, polydipsia. Psych: No depression, insomnia. Other:  All other systems were reviewed with the patient and were negative other that what is mentioned in the HPI.   Physical Examination:   VS: BP 122/80 (BP Location: Left Arm, Cuff Size: Normal)   Pulse 68   Resp 16   Ht 6' (1.829 m)   Wt 168 lb (76.2 kg)   SpO2 94%   BMI 22.78 kg/m   General Appearance: No distress  Neuro:without focal findings,  speech normal,  HEENT: PERRLA, EOM intact.  Dentures. Pulmonary: normal breath sounds, No wheezing.  Decreased air entry bilaterally. CardiovascularNormal S1,S2.  No m/r/g.   Abdomen: Benign, Soft, non-tender. Renal:  No costovertebral tenderness  GU:  No performed at this time. Endoc: No evident thyromegaly, no signs of acromegaly. Skin:   warm, no rashes, no ecchymosis  Extremities: normal, no cyanosis, clubbing.  Other findings:    LABORATORY PANEL:   CBC No results for input(s): WBC, HGB, HCT, PLT in the last 168 hours. ------------------------------------------------------------------------------------------------------------------  Chemistries  No results for input(s): NA, K, CL, CO2, GLUCOSE, BUN, CREATININE, CALCIUM, MG, AST, ALT, ALKPHOS, BILITOT in the last 168 hours.  Invalid input(s):  GFRCGP ------------------------------------------------------------------------------------------------------------------  Cardiac Enzymes No results for input(s): TROPONINI in the last 168 hours. ------------------------------------------------------------  RADIOLOGY:  No results found.     Thank  you for the consultation and for allowing [facility] to assist in the care of your patient. Our recommendations are noted above.  Please contact us if we can be of further service.   Marda Stalker, MD.  Board Certified in Internal Medicine, Pulmonary Medicine, [place], and Sleep Medicine.  [facility] Office Number: [phone]  Patricia Pesa, M.D.  Merton Border, M.D  05/24/2017

## 2017-05-24 NOTE — H&P (View-Only) (Signed)
[facility] Consultation      Assessment and Plan:  Mediastinal lymphadenopathy.  -Possible granulomatous disease such as sarcoidosis, histoplasmosis, versus indolent lymphoma such as CLL. - Discussed risks and benefits with patient, will arrange for EBUS bronchoscopy for diagnosis. -He is to stop Plavix 1 week before procedure.  COPD/Emphysema.  -Known COPD/emphysema, appears adequately controlled at this time without evidence of exacerbation.. - Continue Brio.  Nicotine abuse.  -He is not seriously considering quitting, discussed the importance of smoking cessation, spent greater than 3 minutes in discussion. -Also discussed that continued smoking will increase the risk of complications with bronchoscopy.   Date: 05/24/2017  MRN# [id number] Billy Ortiz 10-31-1940   Billy Ortiz is a 77 y.o. old male seen in consultation for chief complaint of:    Chief Complaint  Patient presents with  . Consult    Referred by Dr. Raul Del for eval of possible EBUS  . Cough  . Shortness of Breath  . Wheezing  . Chest Pain    HPI:   The patient is a 77 year old male sees Dr. Raul Del, he has a history of lung nodules as well as mediastinal lymphadenopathy.  He has had a history of hemoptysis, he had been on aspirin and Plavix at those times.  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  Review of his most recent labs shows calcium levels in the high normal to elevated range.  Images personally reviewed, CT chest 04/25/17; diffuse severe emphysematous change throughout both lungs, worst in the apices.  There is mild right paratracheal calcified lymphadenopathy mild right hilar adenopathy.  There is a large subcarinal and left hilar lymphadenopathy which appears predominantly posterior and superior to the left main stem bronchus. The patient's most recent CT chest report in comparison with previous  CT from 2016 shows both calcified and noncalcified nodes which appear progressive.  There is enlargement of subcarinal node, with a left retrohilar mass which causes narrowing of the left lower lobe bronchus centrally, which appears to be new.  He feels that he has been doing well overall, he feels that his breathing is "terrible" since his open heart surgery, he is not able to do much anymore in terms of activity. He was ambulated around the office today and did quite well without desat or dyspnea. He feels that he is not doing as much anymore due to his age. He has always lived in this area [place].  He is smoking less than a ppd, he is not really thinking about quitting smoking.  He denies chest pain. He has been on plavix and is still on it.   Desat walk 05/24/17; baseline sat on RA at rest 97% and HR 70. Pt walked a total of 360 feet slow pace mild dyspnea. Sat was 94% and HR 74, minimal dyspnea.    PMHX:   Past Medical History:  Diagnosis Date  . Cancer [facility])    prostate  . Cataract   . Elevated lipids   . GERD (gastroesophageal reflux disease)   . HOH (hard of hearing)   . Hypertension   . Pneumonia   . Pulmonary nodules/lesions, multiple   . Shortness of breath dyspnea    Surgical Hx:  Past Surgical History:  Procedure Laterality Date  . BACK SURGERY     discectomy ,reports right foot drop since surgery   . CATARACT EXTRACTION W/PHACO Left 04/22/2015   Procedure: CATARACT  EXTRACTION PHACO AND INTRAOCULAR LENS PLACEMENT (IOC);  Surgeon: Birder Robson, MD;  Location: [facility] ORS;  Service: Ophthalmology;  Laterality: Left;  [place]     1:17.8 AP%   25.7 CDE    20.03 fluid pack lot # [id number]  . COLONOSCOPY  2009  . COLONOSCOPY WITH PROPOFOL N/A 03/24/2016   Procedure: COLONOSCOPY WITH PROPOFOL;  Surgeon: Christene Lye, MD;  Location: [facility] ENDOSCOPY;  Service: Endoscopy;  Laterality: N/A;  . ESOPHAGOGASTRODUODENOSCOPY (EGD) WITH PROPOFOL N/A 03/24/2016   Procedure:  ESOPHAGOGASTRODUODENOSCOPY (EGD) WITH PROPOFOL;  Surgeon: Christene Lye, MD;  Location: [facility] ENDOSCOPY;  Service: Endoscopy;  Laterality: N/A;  . LEFT HEART CATH AND CORONARY ANGIOGRAPHY Left 05/26/2016   Procedure: Left Heart Cath and Coronary Angiography;  Surgeon: Corey Skains, MD;  Location: [place] [facility];  Service: Cardiovascular;  Laterality: Left;   Family Hx:  Family History  Family history unknown: Yes   Social Hx:   Social History   Tobacco Use  . Smoking status: Current Every Day Smoker    Packs/day: 1.00    Years: 60.00    Pack years: 60.00    Types: Cigarettes  . Smokeless tobacco: Never Used  Substance Use Topics  . Alcohol use: No  . Drug use: No   Medication:    Current Outpatient Medications:  .  amLODipine (NORVASC) 10 MG tablet, Take 10 mg by mouth daily., Disp: , Rfl:  .  aspirin EC 81 MG tablet, Take 81 mg by mouth daily., Disp: , Rfl:  .  atorvastatin (LIPITOR) 40 MG tablet, Take 40 mg by mouth daily., Disp: , Rfl:  .  carvedilol (COREG) 3.125 MG tablet, Take 2 tablets by mouth 2 (two) times daily., Disp: , Rfl:  .  docusate sodium (COLACE) 100 MG capsule, Take 100 mg by mouth daily., Disp: , Rfl:  .  doxazosin (CARDURA) 1 MG tablet, Take 1 mg by mouth daily., Disp: , Rfl:  .  fluticasone furoate-vilanterol (BREO ELLIPTA) 100-25 MCG/INH AEPB, Inhale 1 puff into the lungs daily., Disp: , Rfl:  .  isosorbide mononitrate (IMDUR) 60 MG 24 hr tablet, Take 60 mg by mouth daily., Disp: , Rfl:  .  losartan (COZAAR) 100 MG tablet, Take 100 mg by mouth daily. , Disp: , Rfl:  .  methocarbamol (ROBAXIN) 500 MG tablet, Take 500 mg by mouth 2 (two) times daily., Disp: , Rfl:  .  nitroGLYCERIN (NITROSTAT) 0.4 MG SL tablet, Place 0.4 mg under the tongue every 5 (five) minutes as needed for chest pain., Disp: , Rfl:  .  omeprazole (PRILOSEC) 20 MG capsule, Take 20 mg by mouth daily., Disp: , Rfl:  .  polyethylene glycol powder (GLYCOLAX/MIRALAX) powder, 255  grams one bottle for colonoscopy prep, Disp: 255 g, Rfl: 0 .  torsemide (DEMADEX) 20 MG tablet, Take 20 mg by mouth daily. Take on M, W, F if weight is >170lb, Disp: , Rfl:  .  vitamin B-12 (CYANOCOBALAMIN) 500 MCG tablet, Take 500 mcg by mouth daily., Disp: , Rfl:    Allergies:  Tramadol  Review of Systems: Gen:  Denies  fever, sweats, chills HEENT: Denies blurred vision, double vision. bleeds, sore throat Cvc:  No dizziness, chest pain. Resp:   Denies cough or sputum production, shortness of breath Gi: Denies swallowing difficulty, stomach pain. Gu:  Denies bladder incontinence, burning urine Ext:   No Joint pain, stiffness. Skin: No skin rash,  hives  Endoc:  No polyuria, polydipsia. Psych: No depression, insomnia. Other:  All other systems were reviewed with the patient and were negative other that what is mentioned in the HPI.   Physical Examination:   VS: BP 122/80 (BP Location: Left Arm, Cuff Size: Normal)   Pulse 68   Resp 16   Ht 6' (1.829 m)   Wt 168 lb (76.2 kg)   SpO2 94%   BMI 22.78 kg/m   General Appearance: No distress  Neuro:without focal findings,  speech normal,  HEENT: PERRLA, EOM intact.  Dentures. Pulmonary: normal breath sounds, No wheezing.  Decreased air entry bilaterally. CardiovascularNormal S1,S2.  No m/r/g.   Abdomen: Benign, Soft, non-tender. Renal:  No costovertebral tenderness  GU:  No performed at this time. Endoc: No evident thyromegaly, no signs of acromegaly. Skin:   warm, no rashes, no ecchymosis  Extremities: normal, no cyanosis, clubbing.  Other findings:    LABORATORY PANEL:   CBC No results for input(s): WBC, HGB, HCT, PLT in the last 168 hours. ------------------------------------------------------------------------------------------------------------------  Chemistries  No results for input(s): NA, K, CL, CO2, GLUCOSE, BUN, CREATININE, CALCIUM, MG, AST, ALT, ALKPHOS, BILITOT in the last 168 hours.  Invalid input(s):  GFRCGP ------------------------------------------------------------------------------------------------------------------  Cardiac Enzymes No results for input(s): TROPONINI in the last 168 hours. ------------------------------------------------------------  RADIOLOGY:  No results found.     Thank  you for the consultation and for allowing [facility] to assist in the care of your patient. Our recommendations are noted above.  Please contact us if we can be of further service.   Marda Stalker, MD.  Board Certified in Internal Medicine, Pulmonary Medicine, [place], and Sleep Medicine.  [facility] Office Number: [phone]  Patricia Pesa, M.D.  Merton Border, M.D  05/24/2017

## 2017-05-27 ENCOUNTER — Telehealth: Payer: Self-pay | Admitting: *Deleted

## 2017-05-27 NOTE — Telephone Encounter (Signed)
No PA required by [facility] and [place] for EBUS. Rhonda J Cobb

## 2017-05-27 NOTE — Telephone Encounter (Signed)
Patient scheduled for EBUS on 06/03/17 with DR DX: lung mass CPT: 31652/31652

## 2017-05-30 ENCOUNTER — Other Ambulatory Visit: Payer: Self-pay

## 2017-05-30 ENCOUNTER — Encounter
Admission: RE | Admit: 2017-05-30 | Discharge: 2017-05-30 | Disposition: A | Discharge status: Home / Self Care | Payer: Medicare Other | Source: Ambulatory Visit | Attending: Internal Medicine | Admitting: Internal Medicine

## 2017-05-30 NOTE — Patient Instructions (Signed)
Your procedure is scheduled on: Friday 06/03/17 Report to Day Surgery. To find out your arrival time please call [phone] between 1PM - 3PM on Thurs. 06/02/17.  Remember: Instructions that are not followed completely may result in serious medical risk, up to and including death, or upon the discretion of your surgeon and anesthesiologist your surgery may need to be rescheduled.     _X__ 1. Do not eat food after midnight the night before your procedure.                 No gum chewing or hard candies. You may drink clear liquids up to 2 hours                 before you are scheduled to arrive for your surgery- DO not drink clear                 liquids within 2 hours of the start of your surgery.                 Clear Liquids include:  water, apple juice without pulp, clear carbohydrate                 drink such as Clearfast of [place], Black Coffee or Tea (Do not add                 anything to coffee or tea).  __X__2.  On the morning of surgery brush your teeth with toothpaste and water, you may rinse your mouth with mouthwash if you wish.  Do not swallow any  toothpaste of mouthwash.     _X__ 3.  No Alcohol for 24 hours before or after surgery.   _X__ 4.  Do Not Smoke or use e-cigarettes For 24 Hours Prior to Your Surgery.                 Do not use any chewable tobacco products for at least 6 hours prior to                 surgery.  ____  5.  Bring all medications with you on the day of surgery if instructed.   ___x_  6.  Notify your doctor if there is any change in your medical condition      (cold, fever, infections).     Do not wear jewelry, make-up, hairpins, clips or nail polish. Do not wear lotions, powders, or perfumes. You may wear deodorant. Do not shave 48 hours prior to surgery. Men may shave face and neck. Do not bring valuables to the hospital.    [facility] is not responsible for any belongings or valuables.  Contacts, dentures or  bridgework may not be worn into surgery. Leave your suitcase in the car. After surgery it may be brought to your room. For patients admitted to the hospital, discharge time is determined by your treatment team.   Patients discharged the day of surgery will not be allowed to drive home.   Please read over the following fact sheets that you were given:    __x__ Take these medicines the morning of surgery with A SIP OF WATER:    1.amLODipine (NORVASC) 10 MG tablet  2. carvedilol (COREG) 3.125 MG tablet  3. isosorbide mononitrate (IMDUR) 60 MG 24 hr tablet  4.omeprazole (PRILOSEC) 20 MG capsule  5.  6.  ____ Fleet Enema (as directed)   ____ Use CHG Soap as directed  _x___ Use inhalers on the  day of surgeryfluticasone furoate-vilanterol (BREO ELLIPTA) 100-25 MCG/INH AEPB  ____ Stop metformin 2 days prior to surgery    ____ Take 1/2 of usual insulin dose the night before surgery. No insulin the morning          of surgery.   __x__ Stopped aspirin already  __x__ Stop Anti-inflammatories today.  Ibuprofen or Aleve.  May take tylenol   ____ Stop supplements until after surgery.    ____ Bring C-Pap to the hospital.

## 2017-06-01 NOTE — Pre-Procedure Instructions (Signed)
CLEARED BY DR Ubaldo Glassing 05/31/17 ON CHART

## 2017-06-03 ENCOUNTER — Ambulatory Visit: Payer: Medicare Other | Admitting: Certified Registered Nurse Anesthetist

## 2017-06-03 ENCOUNTER — Encounter: Payer: Self-pay | Admitting: *Deleted

## 2017-06-03 ENCOUNTER — Ambulatory Visit
Admission: RE | Admit: 2017-06-03 | Discharge: 2017-06-03 | Disposition: A | Discharge status: Home / Self Care | Payer: Medicare Other | Source: Ambulatory Visit | Attending: Internal Medicine | Admitting: Internal Medicine

## 2017-06-03 ENCOUNTER — Telehealth: Payer: Self-pay | Admitting: *Deleted

## 2017-06-03 ENCOUNTER — Encounter
Admission: RE | Disposition: A | Discharge status: Home / Self Care | Payer: Self-pay | Source: Ambulatory Visit | Attending: Internal Medicine

## 2017-06-03 DIAGNOSIS — M1990 Unspecified osteoarthritis, unspecified site: Secondary | ICD-10-CM | POA: Insufficient documentation

## 2017-06-03 DIAGNOSIS — Z8546 Personal history of malignant neoplasm of prostate: Secondary | ICD-10-CM | POA: Insufficient documentation

## 2017-06-03 DIAGNOSIS — R590 Localized enlarged lymph nodes: Secondary | ICD-10-CM

## 2017-06-03 DIAGNOSIS — I110 Hypertensive heart disease with heart failure: Secondary | ICD-10-CM | POA: Insufficient documentation

## 2017-06-03 DIAGNOSIS — I2510 Atherosclerotic heart disease of native coronary artery without angina pectoris: Secondary | ICD-10-CM | POA: Insufficient documentation

## 2017-06-03 DIAGNOSIS — Z885 Allergy status to narcotic agent status: Secondary | ICD-10-CM | POA: Insufficient documentation

## 2017-06-03 DIAGNOSIS — I739 Peripheral vascular disease, unspecified: Secondary | ICD-10-CM | POA: Diagnosis not present

## 2017-06-03 DIAGNOSIS — C771 Secondary and unspecified malignant neoplasm of intrathoracic lymph nodes: Secondary | ICD-10-CM | POA: Insufficient documentation

## 2017-06-03 DIAGNOSIS — C3402 Malignant neoplasm of left main bronchus: Secondary | ICD-10-CM | POA: Insufficient documentation

## 2017-06-03 DIAGNOSIS — Z79899 Other long term (current) drug therapy: Secondary | ICD-10-CM | POA: Diagnosis not present

## 2017-06-03 DIAGNOSIS — K219 Gastro-esophageal reflux disease without esophagitis: Secondary | ICD-10-CM | POA: Diagnosis not present

## 2017-06-03 DIAGNOSIS — F17210 Nicotine dependence, cigarettes, uncomplicated: Secondary | ICD-10-CM | POA: Diagnosis not present

## 2017-06-03 DIAGNOSIS — J449 Chronic obstructive pulmonary disease, unspecified: Secondary | ICD-10-CM

## 2017-06-03 DIAGNOSIS — J439 Emphysema, unspecified: Secondary | ICD-10-CM | POA: Diagnosis not present

## 2017-06-03 DIAGNOSIS — I509 Heart failure, unspecified: Secondary | ICD-10-CM | POA: Insufficient documentation

## 2017-06-03 DIAGNOSIS — Z7982 Long term (current) use of aspirin: Secondary | ICD-10-CM | POA: Diagnosis not present

## 2017-06-03 DIAGNOSIS — R591 Generalized enlarged lymph nodes: Secondary | ICD-10-CM | POA: Diagnosis present

## 2017-06-03 HISTORY — PX: ENDOBRONCHIAL ULTRASOUND: SHX5096

## 2017-06-03 SURGERY — ENDOBRONCHIAL ULTRASOUND (EBUS)
Anesthesia: General

## 2017-06-03 MED ORDER — DEXAMETHASONE SODIUM PHOSPHATE 10 MG/ML IJ SOLN
INTRAMUSCULAR | Status: AC
Start: 2017-06-03 — End: 2017-06-03
  Filled 2017-06-03: qty 1

## 2017-06-03 MED ORDER — LIDOCAINE HCL 2 % EX GEL
1.0000 "application " | Freq: Once | CUTANEOUS | Status: DC
  Filled 2017-06-03: qty 5

## 2017-06-03 MED ORDER — LACTATED RINGERS IV SOLN
INTRAVENOUS | Status: DC
  Administered 2017-06-03: 12:00:00 via INTRAVENOUS

## 2017-06-03 MED ORDER — PHENYLEPHRINE HCL 0.25 % NA SOLN
1.0000 | Freq: Four times a day (QID) | NASAL | Status: DC | PRN
  Filled 2017-06-03: qty 15

## 2017-06-03 MED ORDER — ONDANSETRON HCL 4 MG/2ML IJ SOLN
INTRAMUSCULAR | Status: DC | PRN
  Administered 2017-06-03: 4 mg via INTRAVENOUS

## 2017-06-03 MED ORDER — PHENYLEPHRINE HCL 10 MG/ML IJ SOLN
INTRAMUSCULAR | Status: AC
  Filled 2017-06-03: qty 1

## 2017-06-03 MED ORDER — FENTANYL CITRATE (PF) 100 MCG/2ML IJ SOLN
INTRAMUSCULAR | Status: DC | PRN
  Administered 2017-06-03 (×2): 50 ug via INTRAVENOUS

## 2017-06-03 MED ORDER — LABETALOL HCL 5 MG/ML IV SOLN
INTRAVENOUS | Status: AC
  Filled 2017-06-03: qty 4

## 2017-06-03 MED ORDER — LIDOCAINE HCL (PF) 1 % IJ SOLN
INTRAMUSCULAR | Status: AC
  Filled 2017-06-03: qty 5

## 2017-06-03 MED ORDER — PROPOFOL 10 MG/ML IV BOLUS
INTRAVENOUS | Status: AC
  Filled 2017-06-03: qty 20

## 2017-06-03 MED ORDER — BUTAMBEN-TETRACAINE-BENZOCAINE 2-2-14 % EX AERO
1.0000 | INHALATION_SPRAY | Freq: Once | CUTANEOUS | Status: DC
  Filled 2017-06-03: qty 20

## 2017-06-03 MED ORDER — SUGAMMADEX SODIUM 200 MG/2ML IV SOLN
INTRAVENOUS | Status: DC | PRN
  Administered 2017-06-03: 200 mg via INTRAVENOUS

## 2017-06-03 MED ORDER — FENTANYL CITRATE (PF) 100 MCG/2ML IJ SOLN: 25.0000 ug | INTRAMUSCULAR | Status: DC | PRN

## 2017-06-03 MED ORDER — ONDANSETRON HCL 4 MG/2ML IJ SOLN: 4.0000 mg | Freq: Once | INTRAMUSCULAR | Status: DC | PRN

## 2017-06-03 MED ORDER — ONDANSETRON HCL 4 MG/2ML IJ SOLN
INTRAMUSCULAR | Status: AC
Start: 2017-06-03 — End: 2017-06-03
  Filled 2017-06-03: qty 2

## 2017-06-03 MED ORDER — LABETALOL HCL 5 MG/ML IV SOLN
INTRAVENOUS | Status: DC | PRN
  Administered 2017-06-03: 5 mg via INTRAVENOUS

## 2017-06-03 MED ORDER — PROPOFOL 10 MG/ML IV BOLUS
INTRAVENOUS | Status: DC | PRN
  Administered 2017-06-03: 120 mg via INTRAVENOUS

## 2017-06-03 MED ORDER — LIDOCAINE HCL (CARDIAC) 20 MG/ML IV SOLN
INTRAVENOUS | Status: DC | PRN
  Administered 2017-06-03: 60 mg via INTRAVENOUS

## 2017-06-03 MED ORDER — SUCCINYLCHOLINE CHLORIDE 20 MG/ML IJ SOLN
INTRAMUSCULAR | Status: AC
  Filled 2017-06-03: qty 1

## 2017-06-03 MED ORDER — FENTANYL CITRATE (PF) 100 MCG/2ML IJ SOLN
INTRAMUSCULAR | Status: AC
  Filled 2017-06-03: qty 2

## 2017-06-03 MED ORDER — ROCURONIUM BROMIDE 100 MG/10ML IV SOLN
INTRAVENOUS | Status: DC | PRN
  Administered 2017-06-03: 40 mg via INTRAVENOUS

## 2017-06-03 MED ORDER — SUGAMMADEX SODIUM 200 MG/2ML IV SOLN
INTRAVENOUS | Status: AC
  Filled 2017-06-03: qty 2

## 2017-06-03 MED ORDER — DEXAMETHASONE SODIUM PHOSPHATE 10 MG/ML IJ SOLN
INTRAMUSCULAR | Status: DC | PRN
  Administered 2017-06-03: 5 mg via INTRAVENOUS

## 2017-06-03 MED ORDER — ROCURONIUM BROMIDE 50 MG/5ML IV SOLN
INTRAVENOUS | Status: AC
  Filled 2017-06-03: qty 1

## 2017-06-03 NOTE — Telephone Encounter (Signed)
-----   Message from [name], MD sent at 06/03/2017  3:05 PM EDT ----- Regarding: fu next week.  S/p bronch today, pt needs fu for results latter part of next week (thurs or fri).

## 2017-06-03 NOTE — Anesthesia Post-op Follow-up Note (Signed)
Anesthesia QCDR form completed.        

## 2017-06-03 NOTE — Interval H&P Note (Signed)
History and Physical Interval Note:  06/03/2017 1:03 PM  Billy Ortiz  has presented today for surgery, with the diagnosis of LUNG MASS  The various methods of treatment have been discussed with the patient and family. After consideration of risks, benefits and other options for treatment, the patient has consented to  Procedure(s): ENDOBRONCHIAL ULTRASOUND (N/A) as a surgical intervention .  The patient's history has been reviewed, patient examined, no change in status, stable for surgery.  I have reviewed the patient's chart and labs.  Questions were answered to the patient's satisfaction.     [name]

## 2017-06-03 NOTE — Transfer of Care (Signed)
Immediate Anesthesia Transfer of Care Note  Patient: [name]  Procedure(s) Performed: ENDOBRONCHIAL ULTRASOUND (N/A )  Patient Location: PACU  Anesthesia Type:General  Level of Consciousness: awake, alert  and oriented  Airway & Oxygen Therapy: Patient Spontanous Breathing and Patient connected to face mask oxygen  Post-op Assessment: Report given to RN and Post -op Vital signs reviewed and stable  Post vital signs: Reviewed and stable  Last Vitals:  Vitals Value Taken Time  BP 173/91 06/03/2017  2:44 PM  Temp 36.2 C 06/03/2017  2:41 PM  Pulse 57 06/03/2017  2:43 PM  Resp 30 06/03/2017  2:43 PM  SpO2 100 % 06/03/2017  2:43 PM  Vitals shown include unvalidated device data.  Last Pain:  Vitals:   06/03/17 1203  TempSrc: Tympanic  PainSc: 0-No pain         Complications: No apparent anesthesia complications

## 2017-06-03 NOTE — OR Nursing (Signed)
Discharge instructions discussed with pt and son. Both voice understanding. 

## 2017-06-03 NOTE — Discharge Instructions (Signed)

## 2017-06-03 NOTE — Anesthesia Procedure Notes (Signed)
Procedure Name: Intubation Date/Time: 06/03/2017 1:32 PM Performed by: Hedda Slade, CRNA Pre-anesthesia Checklist: Patient identified, Patient being monitored, Timeout performed, Emergency Drugs available and Suction available Patient Re-evaluated:Patient Re-evaluated prior to induction Oxygen Delivery Method: Circle system utilized Preoxygenation: Pre-oxygenation with 100% oxygen Induction Type: IV induction Ventilation: Mask ventilation without difficulty Laryngoscope Size: Mac and 4 Grade View: Grade I Tube type: Oral Tube size: 8.0 mm Number of attempts: 1 Airway Equipment and Method: Stylet Placement Confirmation: ETT inserted through vocal cords under direct vision,  positive ETCO2 and breath sounds checked- equal and bilateral Secured at: 22 cm Tube secured with: Tape Dental Injury: Teeth and Oropharynx as per pre-operative assessment

## 2017-06-03 NOTE — Op Note (Signed)
[facility] Patient Name: Billy Ortiz Procedure Date: 06/03/2017 2:19 PM MRN: [id number] Account #: [id number] Date of Birth: 12-20-1940 Admit Type: Outpatient Age: 77 Room: [facility] PROCEDURE RM 01 Gender: Male Note Status: Finalized Attending MD: [name] MD, MD Procedure:         Bronchoscopy Indications:       Sub-carina mass, Left mainstem mass Providers:         [name] MD, MD Referring MD:       Medicines:         General Anesthesia Complications:     No immediate complications Procedure:         Pre-Anesthesia Assessment:                    - A History and Physical has been performed. Patient meds                     and allergies have been reviewed. The risks and benefits                     of the procedure and the sedation options and risks were                     discussed with the patient. All questions were answered                     and informed consent was obtained. Patient identification                     and proposed procedure were verified prior to the                     procedure. Mental Status Examination: normal. ASA Grade                     Assessment: III - A patient with severe systemic disease.                     After reviewing the risks and benefits, the patient was                     deemed in satisfactory condition to undergo the procedure.                     The anesthesia plan was to use general anesthesia.                     Immediately prior to administration of medications, the                     patient was re-assessed for adequacy to receive sedatives.                     The heart rate, respiratory rate, oxygen saturations,                     blood pressure, adequacy of pulmonary ventilation, and                     response to care were monitored throughout the procedure.                     The physical status of the patient was re-assessed after  the procedure.            After obtaining informed consent, the bronchoscope was                     passed under direct vision. Throughout the procedure, the                     patient's blood pressure, pulse, and oxygen saturations                     were monitored continuously.The procedure was accomplished                     without difficulty. The patient tolerated the procedure                     well. the Bronchoscope was introduced through the mouth,                     via the endotracheal tube and advanced to the                     tracheobronchial tree. Findings:      Left Lung Abnormalities: A partially obstructing (about 20% obstructed)       mass was found in the middle portion in the left mainstem bronchus. The       mass was medium-sized and endobronchial, infiltrative, polypoid and       submucosal. The lesion was successfully traversed. Endobronchial needle       aspiration of a lesion was performed using an Olympus EBUS-TBNA needle.       Endobronchial needle aspiration of a lesion was performed.      Endobronchial biopsies of a lesion were performed using a forceps.      Transbronchial brushings of a lesion were obtained with a cytology brush.      Washings were obtained in the left mainstem bronchus. Impression:        - Sub-carina mass                    - Left mainstem mass                    - An endobronchial, infiltrative, polypoid and submucosal                     mass was found in the left mainstem bronchus. This lesion                     is suspicious for malignancy.                    - Endobronchial needle aspiration was performed.                    - Endobronchial needle aspiration was performed.                    - An endobronchial biopsy was performed.                    - Transbronchial brushings were obtained.                    - Washings were obtained. Recommendation:    - Await test results. Minh Roanhorse Verne Spurr MD, MD 06/03/2017  2:40:13 PM This report has  been signed electronically. Number of Addenda: 0 Note Initiated On: 06/03/2017 2:19 PM      [facility]

## 2017-06-03 NOTE — Anesthesia Postprocedure Evaluation (Signed)
Anesthesia Post Note  Patient: Billy Ortiz  Procedure(s) Performed: ENDOBRONCHIAL ULTRASOUND (N/A )  Patient location during evaluation: PACU Anesthesia Type: General Level of consciousness: awake and alert and oriented Pain management: pain level controlled Vital Signs Assessment: post-procedure vital signs reviewed and stable Respiratory status: spontaneous breathing Cardiovascular status: blood pressure returned to baseline Anesthetic complications: no     Last Vitals:  Vitals:   06/03/17 1603 06/03/17 1604  BP: (!) 154/80   Pulse:  (!) 55  Resp:    Temp:    SpO2: 94%     Last Pain:  Vitals:   06/03/17 1545  TempSrc:   PainSc: 0-No pain                 [name]

## 2017-06-03 NOTE — Anesthesia Preprocedure Evaluation (Signed)
Anesthesia Evaluation  Patient identified by MRN, date of birth, ID band Patient awake    Reviewed: Allergy & Precautions  Airway Mallampati: II       Dental  (+) Upper Dentures   Pulmonary shortness of breath, pneumonia, COPD, Current Smoker,    + rhonchi  + decreased breath sounds      Cardiovascular Exercise Tolerance: Good hypertension, Pt. on medications + angina + CAD, + Peripheral Vascular Disease and +CHF  + dysrhythmias  Rhythm:Regular     Neuro/Psych  Neuromuscular disease negative neurological ROS  negative psych ROS   GI/Hepatic Neg liver ROS, GERD  Medicated,  Endo/Other  negative endocrine ROS  Renal/GU negative Renal ROS     Musculoskeletal  (+) Arthritis ,   Abdominal Normal abdominal exam  (+)   Peds  Hematology  (+) anemia ,   Anesthesia Other Findings   Reproductive/Obstetrics                             Anesthesia Physical  Anesthesia Plan  ASA: III  Anesthesia Plan: General   Post-op Pain Management:    Induction: Intravenous  PONV Risk Score and Plan:   Airway Management Planned: Oral ETT  Additional Equipment:   Intra-op Plan:   Post-operative Plan: Extubation in OR  Informed Consent: I have reviewed the patients History and Physical, chart, labs and discussed the procedure including the risks, benefits and alternatives for the proposed anesthesia with the patient or authorized representative who has indicated his/her understanding and acceptance.   Dental advisory given  Plan Discussed with: Surgeon and CRNA  Anesthesia Plan Comments:         Anesthesia Quick Evaluation

## 2017-06-05 LAB — ACID FAST SMEAR (AFB, MYCOBACTERIA)

## 2017-06-05 LAB — ACID FAST SMEAR (AFB): ACID FAST SMEAR - AFSCU2: NEGATIVE

## 2017-06-06 ENCOUNTER — Encounter: Payer: Self-pay | Admitting: Internal Medicine

## 2017-06-06 LAB — CULTURE, BAL-QUANTITATIVE W GRAM STAIN: Culture: 2000 — AB

## 2017-06-06 LAB — SURGICAL PATHOLOGY

## 2017-06-06 LAB — CYTOLOGY - NON PAP

## 2017-06-06 LAB — CULTURE, BAL-QUANTITATIVE

## 2017-06-06 NOTE — Telephone Encounter (Signed)
appt scheduled 06/09/17 and 06/14/17 cancelled.

## 2017-06-09 ENCOUNTER — Ambulatory Visit (INDEPENDENT_AMBULATORY_CARE_PROVIDER_SITE_OTHER): Payer: Medicare Other | Admitting: Internal Medicine

## 2017-06-09 ENCOUNTER — Encounter: Payer: Self-pay | Admitting: Internal Medicine

## 2017-06-09 VITALS — BP 132/74 | HR 63 | Ht 72.0 in | Wt 172.0 lb

## 2017-06-09 DIAGNOSIS — C3412 Malignant neoplasm of upper lobe, left bronchus or lung: Secondary | ICD-10-CM

## 2017-06-09 DIAGNOSIS — R590 Localized enlarged lymph nodes: Secondary | ICD-10-CM

## 2017-06-09 DIAGNOSIS — J449 Chronic obstructive pulmonary disease, unspecified: Secondary | ICD-10-CM

## 2017-06-09 DIAGNOSIS — J439 Emphysema, unspecified: Secondary | ICD-10-CM

## 2017-06-09 NOTE — Progress Notes (Signed)
[facility] Consultation      Assessment and Plan:  Squamous cell lung cancer. -Status post EBUS bronchoscopy on 06/03/17, squamous cell cancer seen in left endobronchial lesions, as well as left hilar node and subcarinal nodes. -Relayed diagnosis to patient.  We will set up follow-up with oncology as soon as possible.  Patient questioning diagnosis, and is considering getting a second opinion, however he is agreeable to follow-up with oncology.   COPD/Emphysema.  -Known COPD/emphysema, appears adequately controlled at this time without evidence of exacerbation.. - Continue Brio. -Continue to follow-up with Dr. Raul Del.  Nicotine abuse.  -He is not seriously considering quitting, discussed the importance of smoking cessation, spent greater than 3 minutes in discussion.  Orders Placed This Encounter  Procedures  . Ambulatory referral to Oncology      Date: 06/09/2017  MRN# [id number] Billy Ortiz 1940-04-21   Billy Ortiz is a 77 y.o. old male seen in consultation for chief complaint of:    No chief complaint on file.   HPI:   The patient is a 77 year old male sees Dr. Raul Del, he has a history of lung nodules as well as mediastinal lymphadenopathy, status post EBUS bronchoscopy, bronchoscopy was unfortunately positive for squamous cell lung cancer seen endobronchially in the left mainstem, with spread to the left hilar node/mass as well as subcarinal node. He appears to have recovered well from the bronchoscopy, no fevers, no worsening shortness of breath, no hemoptysis.  He reports that he is returned back to his usual medications.  He feels that his breathing is back to his previous baseline.  He notes that he continues to have excess mucus production.  --CT chest 04/25/17; diffuse severe emphysematous change throughout both lungs, worst in the apices.  There is mild right paratracheal calcified lymphadenopathy mild right hilar adenopathy.  There is a  large subcarinal and left hilar lymphadenopathy which appears predominantly posterior and superior to the left main stem bronchus. The patient's most recent CT chest report in comparison with previous CT from 2016 shows both calcified and noncalcified nodes which appear progressive.  There is enlargement of subcarinal node, with a left retrohilar mass which causes narrowing of the left lower lobe bronchus centrally, which appears to be new.   Desat walk 05/24/17; baseline sat on RA at rest 97% and HR 70. Pt walked a total of 360 feet slow pace mild dyspnea. Sat was 94% and HR 74, minimal dyspnea.   Social Hx:   Social History   Tobacco Use  . Smoking status: Current Every Day Smoker    Packs/day: 1.00    Years: 60.00    Pack years: 60.00    Types: Cigarettes  . Smokeless tobacco: Never Used  Substance Use Topics  . Alcohol use: No  . Drug use: No   Medication:    Current Outpatient Medications:  .  amLODipine (NORVASC) 10 MG tablet, Take 10 mg by mouth daily., Disp: , Rfl:  .  aspirin EC 81 MG tablet, Take 81 mg by mouth daily., Disp: , Rfl:  .  atorvastatin (LIPITOR) 10 MG tablet, Take 10 mg by mouth daily at 6 PM., Disp: , Rfl:  .  atorvastatin (LIPITOR) 40 MG tablet, Take 40 mg by mouth daily., Disp: , Rfl:  .  carvedilol (COREG) 3.125 MG tablet, Take 2 tablets by mouth 2 (two) times daily., Disp: , Rfl:  .  docusate sodium (COLACE) 100 MG capsule, Take 100 mg by mouth daily., Disp: , Rfl:  .  fluticasone furoate-vilanterol (BREO ELLIPTA) 100-25 MCG/INH AEPB, Inhale 1 puff into the lungs daily., Disp: , Rfl:  .  isosorbide mononitrate (IMDUR) 60 MG 24 hr tablet, Take 60 mg by mouth daily., Disp: , Rfl:  .  losartan (COZAAR) 100 MG tablet, Take 100 mg by mouth daily. , Disp: , Rfl:  .  methocarbamol (ROBAXIN) 500 MG tablet, Take 500 mg by mouth 2 (two) times daily., Disp: , Rfl:  .  nitroGLYCERIN (NITROSTAT) 0.4 MG SL tablet, Place 0.4 mg under the tongue every 5 (five) minutes as  needed for chest pain., Disp: , Rfl:  .  omeprazole (PRILOSEC) 20 MG capsule, Take 20 mg by mouth 2 (two) times daily before a meal. , Disp: , Rfl:  .  polyethylene glycol powder (GLYCOLAX/MIRALAX) powder, 255 grams one bottle for colonoscopy prep, Disp: 255 g, Rfl: 0 .  torsemide (DEMADEX) 20 MG tablet, Take 20 mg by mouth daily. Take on M, W, F if weight is >170lb, Disp: , Rfl:  .  vitamin B-12 (CYANOCOBALAMIN) 500 MCG tablet, Take 1,000 mcg by mouth daily. , Disp: , Rfl:    Allergies:  Tramadol  Review of Systems: Gen:  Denies  fever, sweats, chills HEENT: Denies blurred vision, double vision. bleeds, sore throat Cvc:  No dizziness, chest pain. Resp:   Denies cough or sputum production, shortness of breath Gi: Denies swallowing difficulty, stomach pain. Gu:  Denies bladder incontinence, burning urine Ext:   No Joint pain, stiffness. Skin: No skin rash,  hives  Endoc:  No polyuria, polydipsia. Psych: No depression, insomnia. Other:  All other systems were reviewed with the patient and were negative other that what is mentioned in the HPI.   Physical Examination:   VS: BP 132/74 (BP Location: Left Arm, Cuff Size: Normal)   Pulse 63   Ht 6' (1.829 m)   Wt 172 lb (78 kg)   SpO2 95%   BMI 23.33 kg/m   General Appearance: No distress  Neuro:without focal findings,  speech normal,  HEENT: PERRLA, EOM intact.  Dentures. Pulmonary: normal breath sounds, No wheezing.  Decreased air entry bilaterally. CardiovascularNormal S1,S2.  No m/r/g.   Abdomen: Benign, Soft, non-tender. Renal:  No costovertebral tenderness  GU:  No performed at this time. Endoc: No evident thyromegaly, no signs of acromegaly. Skin:   warm, no rashes, no ecchymosis  Extremities: normal, no cyanosis, clubbing.  Other findings:    LABORATORY PANEL:   CBC No results for input(s): WBC, HGB, HCT, PLT in the last 168  hours. ------------------------------------------------------------------------------------------------------------------  Chemistries  No results for input(s): NA, K, CL, CO2, GLUCOSE, BUN, CREATININE, CALCIUM, MG, AST, ALT, ALKPHOS, BILITOT in the last 168 hours.  Invalid input(s): GFRCGP ------------------------------------------------------------------------------------------------------------------  Cardiac Enzymes No results for input(s): TROPONINI in the last 168 hours. ------------------------------------------------------------  RADIOLOGY:  No results found.     Thank  you for the consultation and for allowing [facility] to assist in the care of your patient. Our recommendations are noted above.  Please contact us if we can be of further service.   Marda Stalker, MD.  Board Certified in Internal Medicine, Pulmonary Medicine, [place], and Sleep Medicine.  [facility] Office Number: [phone]  Patricia Pesa, M.D.  Merton Border, M.D  06/09/2017

## 2017-06-09 NOTE — Patient Instructions (Signed)
Will refer you to a cancer doctor for your lung cancer.  Continue to follow up with Dr. Raul Del.

## 2017-06-10 ENCOUNTER — Encounter: Payer: Self-pay | Admitting: Oncology

## 2017-06-10 ENCOUNTER — Other Ambulatory Visit: Payer: Self-pay

## 2017-06-10 ENCOUNTER — Inpatient Hospital Stay: Payer: Medicare Other | Attending: Oncology | Admitting: Oncology

## 2017-06-10 ENCOUNTER — Inpatient Hospital Stay: Payer: Medicare Other

## 2017-06-10 ENCOUNTER — Encounter: Payer: Self-pay | Admitting: *Deleted

## 2017-06-10 VITALS — BP 119/73 | HR 77 | Temp 98.1°F | Ht 71.5 in | Wt 174.0 lb

## 2017-06-10 DIAGNOSIS — I10 Essential (primary) hypertension: Secondary | ICD-10-CM | POA: Diagnosis not present

## 2017-06-10 DIAGNOSIS — R531 Weakness: Secondary | ICD-10-CM | POA: Diagnosis not present

## 2017-06-10 DIAGNOSIS — C3402 Malignant neoplasm of left main bronchus: Secondary | ICD-10-CM | POA: Insufficient documentation

## 2017-06-10 DIAGNOSIS — D509 Iron deficiency anemia, unspecified: Secondary | ICD-10-CM | POA: Insufficient documentation

## 2017-06-10 DIAGNOSIS — C3490 Malignant neoplasm of unspecified part of unspecified bronchus or lung: Secondary | ICD-10-CM

## 2017-06-10 DIAGNOSIS — I700 Atherosclerosis of aorta: Secondary | ICD-10-CM | POA: Insufficient documentation

## 2017-06-10 DIAGNOSIS — E785 Hyperlipidemia, unspecified: Secondary | ICD-10-CM | POA: Diagnosis not present

## 2017-06-10 DIAGNOSIS — J439 Emphysema, unspecified: Secondary | ICD-10-CM | POA: Insufficient documentation

## 2017-06-10 DIAGNOSIS — Z79899 Other long term (current) drug therapy: Secondary | ICD-10-CM | POA: Diagnosis not present

## 2017-06-10 DIAGNOSIS — K219 Gastro-esophageal reflux disease without esophagitis: Secondary | ICD-10-CM

## 2017-06-10 DIAGNOSIS — R5383 Other fatigue: Secondary | ICD-10-CM | POA: Insufficient documentation

## 2017-06-10 DIAGNOSIS — C3492 Malignant neoplasm of unspecified part of left bronchus or lung: Secondary | ICD-10-CM

## 2017-06-10 DIAGNOSIS — F17210 Nicotine dependence, cigarettes, uncomplicated: Secondary | ICD-10-CM | POA: Diagnosis not present

## 2017-06-10 DIAGNOSIS — Z8701 Personal history of pneumonia (recurrent): Secondary | ICD-10-CM | POA: Diagnosis not present

## 2017-06-10 DIAGNOSIS — R0602 Shortness of breath: Secondary | ICD-10-CM | POA: Diagnosis not present

## 2017-06-10 LAB — HEPATIC FUNCTION PANEL
ALT: 9 U/L — AB (ref 17–63)
AST: 13 U/L — AB (ref 15–41)
Albumin: 3.3 g/dL — ABNORMAL LOW (ref 3.5–5.0)
Alkaline Phosphatase: 89 U/L (ref 38–126)
Bilirubin, Direct: 0.2 mg/dL (ref 0.1–0.5)
Indirect Bilirubin: 0.9 mg/dL (ref 0.3–0.9)
TOTAL PROTEIN: 6.8 g/dL (ref 6.5–8.1)
Total Bilirubin: 1.1 mg/dL (ref 0.3–1.2)

## 2017-06-10 LAB — CBC WITH DIFFERENTIAL/PLATELET
BASOS PCT: 1 %
Basophils Absolute: 0.1 10*3/uL (ref 0–0.1)
EOS ABS: 0.3 10*3/uL (ref 0–0.7)
Eosinophils Relative: 3 %
HCT: 35.9 % — ABNORMAL LOW (ref 40.0–52.0)
HEMOGLOBIN: 11.7 g/dL — AB (ref 13.0–18.0)
Lymphocytes Relative: 10 %
Lymphs Abs: 0.9 10*3/uL — ABNORMAL LOW (ref 1.0–3.6)
MCH: 24.3 pg — ABNORMAL LOW (ref 26.0–34.0)
MCHC: 32.7 g/dL (ref 32.0–36.0)
MCV: 74.3 fL — ABNORMAL LOW (ref 80.0–100.0)
Monocytes Absolute: 1 10*3/uL (ref 0.2–1.0)
Monocytes Relative: 11 %
NEUTROS PCT: 75 %
Neutro Abs: 6.7 10*3/uL — ABNORMAL HIGH (ref 1.4–6.5)
Platelets: 223 10*3/uL (ref 150–440)
RBC: 4.83 MIL/uL (ref 4.40–5.90)
RDW: 15.5 % — ABNORMAL HIGH (ref 11.5–14.5)
WBC: 9 10*3/uL (ref 3.8–10.6)

## 2017-06-10 LAB — IRON AND TIBC
Iron: 13 ug/dL — ABNORMAL LOW (ref 45–182)
SATURATION RATIOS: 6 % — AB (ref 17.9–39.5)
TIBC: 233 ug/dL — AB (ref 250–450)
UIBC: 220 ug/dL

## 2017-06-10 LAB — FERRITIN: Ferritin: 116 ng/mL (ref 24–336)

## 2017-06-10 NOTE — Progress Notes (Signed)
  Oncology Nurse Navigator Documentation  Navigator Location: [facility] Onc (06/10/17 1200) Referral date to RadOnc/MedOnc: 06/09/17 (06/10/17 1200) )Navigator Encounter Type: Initial MedOnc (06/10/17 1200)   Abnormal Finding Date: 04/25/17 (06/10/17 1200) Confirmed Diagnosis Date: 06/06/17 (06/10/17 1200)                 Treatment Phase: Pre-Tx/Tx Discussion (06/10/17 1200) Barriers/Navigation Needs: Coordination of Care;Education (06/10/17 1200) Education: Understanding Cancer/ Treatment Options;Newly Diagnosed Cancer Education;Smoking cessation (06/10/17 1200) Interventions: Coordination of Care;Education (06/10/17 1200)   Coordination of Care: Appts;Radiology;Chemo (06/10/17 1200) Education Method: Verbal;Written (06/10/17 1200)      Acuity: Level 1 (06/10/17 1200) Acuity Level 1: Initial guidance, education and coordination as needed;Minimal follow up required (06/10/17 1200)  met with patient during initial med-onc consultation with Dr. Tasia Catchings to review results and discuss treatment options. All questions answered at the time of visit. Pt given resources regarding diagnosis and supportive services available. Reviewed upcoming appts with patient. Informed that follow up appt will be scheduled with Dr. Tasia Catchings once radiation is scheduled. Contact info given and instructed to call with any further questions or needs. Pt verbalized understanding. Nothing further needed at this time.      Time Spent with Patient: 60 (06/10/17 1200)

## 2017-06-10 NOTE — Progress Notes (Signed)
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   HISTORY OF PRESENTING ILLNESS:  Billy Ortiz is a  77 y.o.  male with PMH listed below who was referred to me for evaluation of lung cancer.  #Follows up with pulmonologist Dr. Raul Del.  He was found to have lung nodules as well as mediastinal lymphadenopathy. Image work up # CT chest 04/25/17; New left retro hilar central lung mass versus adenopathy with resulting mass effect on the left lower lobe bronchus, worrisome for bronchogenic carcinoma. Subcarinal adenopathy has also progressed. Evaluation is complicated by the lack of intravenous contrast and the presence of underlying calcified mediastinal adenopathy consistent with previous granulomatous disease. Management options include bronchoscopy for tissue sampling and PET-CT. 2. No highly suspicious peripheral pulmonary nodules. There is a new perifissural nodule along the superior aspect of the right major fissure, measuring 5 mm. 3. Underlying moderate centrilobular emphysema. 4. Interval median sternotomy. Extensive aortic Atherosclerosis (ICD10-I70.0). 5. These results will be called to the ordering clinician or representative by the Radiologist Assistant, and communication documented in the PACS or zVision Dashboard.  05/10/2017 PET scan  1. Intense FDG uptake associated with the central left perihilar lung mass. Associated subcarinal and right paratracheal hypermetabolic lymph nodes identified. No evidence for distant metastatic disease. Assuming a non-small cell histology  imaging findings on today's study suggest T4N3M0 lesion or stage IIIB. 2.  Aortic Atherosclerosis (ICD10-I70.0). 3. 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 Recommend followup by ultrasound in 3 years. This recommendation follows ACR consensus guidelines: White Paper of the ACR Incidental Findings Committee II on Vascular Findings. J Am Coll Radiol 2013; 10:789-794. Aortic aneurysm NOS (ICD10-I71.9). 4.  Emphysema (ICD10-J43.9).  # 06/03/2017  Status post EBUS bronchoscopy which reviewed lesion endobronchially in the left mainstem, with spread to the left hilar node/mass as well as subcarinal node. Pathology revealed: A. Left main stem bronchus, DIAGNOSIS:  A. LEFT MAINSTEM BRONCHUS; FORCEPS BIOPSY: - SQUAMOUS CELL CARCINOMA.   Patient reports having cough, with intermittent sputum production, sometimes clear sputum sometimes yellowish. No fever, chill, headache, night sweating, weight loss. Appetite is good. Feels tired.  He has difficulty to concentrate, easy to get distracted, able to re-focus.  He lives with his sister.   Review of Systems  Constitutional: Positive for malaise/fatigue. Negative for chills, fever and weight loss.  HENT: Negative for nosebleeds.   Eyes: Negative for double vision and pain.  Respiratory: Positive for cough and sputum production. Negative for hemoptysis, shortness of breath and wheezing.   Cardiovascular: Negative for chest pain.  Gastrointestinal: Negative for abdominal pain, nausea and vomiting.  Genitourinary: Negative for dysuria and urgency.  Musculoskeletal: Negative for neck pain.  Skin: Negative for rash.  Neurological: Negative for dizziness, tremors, sensory change and headaches.  Endo/Heme/Allergies: Does not bruise/bleed easily.  Psychiatric/Behavioral: Negative for depression, substance abuse and suicidal ideas.    MEDICAL HISTORY:  Past Medical History:  Diagnosis Date  . Cancer [facility])    prostate  . Cataract   . Elevated  lipids   . GERD (gastroesophageal reflux disease)   . HOH (hard of hearing)   . Hypertension   . Pneumonia   . Pulmonary nodules/lesions, multiple   .  Shortness of breath dyspnea     SURGICAL HISTORY: Past Surgical History:  Procedure Laterality Date  . BACK SURGERY     discectomy ,reports right foot drop since surgery   . CARDIAC CATHETERIZATION     2 stents  . CATARACT EXTRACTION W/PHACO Left 04/22/2015   Procedure: CATARACT EXTRACTION PHACO AND INTRAOCULAR LENS PLACEMENT (IOC);  Surgeon: Birder Robson, MD;  Location: [facility] ORS;  Service: Ophthalmology;  Laterality: Left;  [place]     1:17.8 AP%   25.7 CDE    20.03 fluid pack lot # [id number]  . COLONOSCOPY  2009  . COLONOSCOPY WITH PROPOFOL N/A 03/24/2016   Procedure: COLONOSCOPY WITH PROPOFOL;  Surgeon: Christene Lye, MD;  Location: [facility] END[facility];  Service: Endoscopy;  Laterality: N/A;  . CORONARY ARTERY BYPASS GRAFT     single  . ENDOBRONCHIAL ULTRASOUND N/A 06/03/2017   Procedure: ENDOBRONCHIAL ULTRASOUND;  Surgeon: [name], MD;  Location: [facility] ORS;  Service: Pulmonary;  Laterality: N/A;  . ESOPHAGOGASTRODUODENOSCOPY (EGD) WITH PROPOFOL N/A 03/24/2016   Procedure: ESOPHAGOGASTRODUODENOSCOPY (EGD) WITH PROPOFOL;  Surgeon: Christene Lye, MD;  Location: [facility] ENDOS[facility];  Service: Endoscopy;  Laterality: N/A;  . EYE SURGERY     bilateral  . LEFT HEART CATH AND CORONARY ANGIOGRAPHY Left 05/26/2016   Procedure: Left Heart Cath and Coronary Angiography;  Surgeon: Corey Skains, MD;  Location: [place] [facility];  Service: Cardiovascular;  Laterality: Left;    SOCIAL HISTORY: Social History   Socioeconomic History  . Marital status: Single    Spouse name: Not on file  . Number of children: Not on file  . Years of education: Not on file  . Highest education level: Not on file  Occupational History  . Not on file  Social Needs  . Financial resource strain: Not on file  . Food insecurity:    Worry:  Not on file    Inability: Not on file  . Transportation needs:    Medical: Not on file    Non-medical: Not on file  Tobacco Use  . Smoking status: Current Every Day Smoker    Packs/day: 1.00    Years: 60.00    Pack years: 60.00    Types: Cigarettes  . Smokeless tobacco: Never Used  Substance and Sexual Activity  . Alcohol use: No  . Drug use: No  . Sexual activity: Not on file  Lifestyle  . Physical activity:    Days per week: Not on file    Minutes per session: Not on file  . Stress: Not on file  Relationships  . Social connections:    Talks on phone: Not on file    Gets together: Not on file    Attends religious service: Not on file    Active member of club or organization: Not on file    Attends meetings of clubs or organizations: Not on file    Relationship status: Not on file  . Intimate partner violence:    Fear of current or ex partner: Not on file    Emotionally abused: Not on file    Physically abused: Not on file    Forced sexual activity: Not on file  Other Topics Concern  . Not on file  Social History Narrative  . Not on file    FAMILY HISTORY: Family History  Family history unknown: Yes    ALLERGIES:  is allergic to tramadol and lisinopril.  MEDICATIONS:  Current Outpatient Medications  Medication Sig Dispense Refill  . amLODipine (  NORVASC) 10 MG tablet Take 10 mg by mouth daily.    Marland Kitchen aspirin EC 81 MG tablet Take 81 mg by mouth daily.    Marland Kitchen atorvastatin (LIPITOR) 10 MG tablet Take 10 mg by mouth daily at 6 PM.    . atorvastatin (LIPITOR) 40 MG tablet Take 40 mg by mouth daily.    . carvedilol (COREG) 3.125 MG tablet Take 2 tablets by mouth 2 (two) times daily.    Marland Kitchen docusate sodium (COLACE) 100 MG capsule Take 100 mg by mouth daily.    . fluticasone furoate-vilanterol (BREO ELLIPTA) 100-25 MCG/INH AEPB Inhale 1 puff into the lungs daily.    . isosorbide mononitrate (IMDUR) 60 MG 24 hr tablet Take 60 mg by mouth daily.    Marland Kitchen losartan (COZAAR) 100 MG  tablet Take 100 mg by mouth daily.     . methocarbamol (ROBAXIN) 500 MG tablet Take 500 mg by mouth 2 (two) times daily.    . nitroGLYCERIN (NITROSTAT) 0.4 MG SL tablet Place 0.4 mg under the tongue every 5 (five) minutes as needed for chest pain.    Marland Kitchen omeprazole (PRILOSEC) 20 MG capsule Take 20 mg by mouth 2 (two) times daily before a meal.     . polyethylene glycol powder (GLYCOLAX/MIRALAX) powder 255 grams one bottle for colonoscopy prep 255 g 0  . torsemide (DEMADEX) 20 MG tablet Take 20 mg by mouth daily. Take on M, W, F if weight is >170lb    . vitamin B-12 (CYANOCOBALAMIN) 500 MCG tablet Take 1,000 mcg by mouth daily.      No current facility-administered medications for this visit.      PHYSICAL EXAMINATION: ECOG PERFORMANCE STATUS: 1 - Symptomatic but completely ambulatory Vitals:   06/10/17 1316  BP: 119/73  Pulse: 77  Temp: 98.1 F (36.7 C)  SpO2: 98%   Filed Weights   06/10/17 1316  Weight: 174 lb (78.9 kg)    Physical Exam  Constitutional: He is oriented to person, place, and time. He appears well-developed and well-nourished.  HENT:  Head: Normocephalic and atraumatic.  Eyes: Pupils are equal, round, and reactive to light. EOM are normal.  Neck: Normal range of motion. Neck supple.  Cardiovascular: Normal rate, regular rhythm and normal heart sounds.  No murmur heard. Pulmonary/Chest: Effort normal.  Decreased breathsound bilaterally.   Abdominal: Soft. Bowel sounds are normal.  Musculoskeletal: Normal range of motion. He exhibits no edema.  Lymphadenopathy:    He has no cervical adenopathy.  Neurological: He is alert and oriented to person, place, and time. No cranial nerve deficit.  Skin: Skin is warm and dry. No erythema.  Psychiatric: He has a normal mood and affect. His behavior is normal.     LABORATORY DATA:  I have reviewed the data as listed Lab Results  Component Value Date   WBC 9.0 06/10/2017   HGB 11.7 (L) 06/10/2017   HCT 35.9 (L)  06/10/2017   MCV 74.3 (L) 06/10/2017   PLT 223 06/10/2017   Recent Labs    05/04/17 1131 06/10/17 1111  NA 137  --   K 3.7  --   CL 105  --   CO2 24  --   GLUCOSE 78  --   BUN 12  --   CREATININE 1.86*  --   CALCIUM 9.2  --   GFRNONAA 34*  --   GFRAA 39*  --   PROT  --  6.8  ALBUMIN  --  3.3*  AST  --  13*  ALT  --  9*  ALKPHOS  --  89  BILITOT  --  1.1  BILIDIR  --  0.2  IBILI  --  0.9       ASSESSMENT & PLAN:  77yo Male present for evaluation for stage IIIC lung squamous carcinoma. 1. Squamous cell lung cancer, left ([place])   2. Malignant neoplasm of unspecified part of unspecified bronchus or lung ([place])   3. Microcytic anemia    # Image results reviewed by me personally.  Image results and pathology results, disease extent, prognosis were discussed with patient.  Patient has been squamous carcinoma, likely stage IIIc,  Recommend brain images to rule out CNS involvement.  Although patient has had MRI in the past, he reports that he has in his right lung.  will obtain CT brain.  If no CNS metastasis, recommend concurrent chemoradiation weekly carboplatin and Taxol.  I explained to the patient the risks and benefits of chemotherapy including all but not limited to hair loss, mouth sore, nausea, vomiting, low blood counts, bleeding, and risk of life threatening infection and even death, secondary malignancy etc.  Risk of nauropathy is associated with taxol .  # Chemotherapy education; port placement. Hopefully the planned start chemotherapy next week. Antiemetics-Zofran and Compazine; EMLA cream sent to pharmacy Patient has an appointment with radiation oncology.   # Microcytic anemia: check iron TIBC ferritin All questions were answered. The patient knows to call the clinic with any problems questions or concerns.  Return of visit: To be determined.  I will see patient on day 1 of treatment for concurrent chemoradiation. Thank you for this kind referral and the  opportunity to participate in the care of this patient. A copy of today's note is routed to referring provider    Earlie Server, MD, PhD Hematology Oncology [facility] Pager- [phone] 06/10/2017

## 2017-06-10 NOTE — Progress Notes (Signed)
Patient here today as a new patient with lung cancer

## 2017-06-13 ENCOUNTER — Other Ambulatory Visit: Payer: Self-pay

## 2017-06-13 ENCOUNTER — Encounter: Payer: Self-pay | Admitting: Radiation Oncology

## 2017-06-13 ENCOUNTER — Ambulatory Visit
Admission: RE | Admit: 2017-06-13 | Discharge: 2017-06-13 | Disposition: A | Discharge status: Home / Self Care | Payer: Medicare Other | Source: Ambulatory Visit | Attending: Radiation Oncology | Admitting: Radiation Oncology

## 2017-06-13 ENCOUNTER — Encounter: Payer: Self-pay | Admitting: *Deleted

## 2017-06-13 VITALS — BP 120/58 | Temp 98.0°F | Resp 18 | Wt 172.4 lb

## 2017-06-13 DIAGNOSIS — Z9889 Other specified postprocedural states: Secondary | ICD-10-CM | POA: Diagnosis not present

## 2017-06-13 DIAGNOSIS — I10 Essential (primary) hypertension: Secondary | ICD-10-CM | POA: Insufficient documentation

## 2017-06-13 DIAGNOSIS — Z859 Personal history of malignant neoplasm, unspecified: Secondary | ICD-10-CM | POA: Diagnosis not present

## 2017-06-13 DIAGNOSIS — F17210 Nicotine dependence, cigarettes, uncomplicated: Secondary | ICD-10-CM | POA: Diagnosis not present

## 2017-06-13 DIAGNOSIS — K219 Gastro-esophageal reflux disease without esophagitis: Secondary | ICD-10-CM | POA: Diagnosis not present

## 2017-06-13 DIAGNOSIS — Z888 Allergy status to other drugs, medicaments and biological substances status: Secondary | ICD-10-CM | POA: Insufficient documentation

## 2017-06-13 DIAGNOSIS — Z79899 Other long term (current) drug therapy: Secondary | ICD-10-CM | POA: Diagnosis not present

## 2017-06-13 DIAGNOSIS — Z7982 Long term (current) use of aspirin: Secondary | ICD-10-CM | POA: Diagnosis not present

## 2017-06-13 DIAGNOSIS — C3492 Malignant neoplasm of unspecified part of left bronchus or lung: Secondary | ICD-10-CM | POA: Insufficient documentation

## 2017-06-13 DIAGNOSIS — Z808 Family history of malignant neoplasm of other organs or systems: Secondary | ICD-10-CM | POA: Diagnosis not present

## 2017-06-13 NOTE — Consult Note (Signed)
NEW PATIENT EVALUATION  Name: Billy Ortiz  MRN: [id number]  Date:   06/13/2017     DOB: 1940/03/13   This 77 y.o. 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  REFERRING PHYSICIAN: [facility], [place]:  Chief Complaint  Patient presents with  . Lung Cancer    Initial Evaluation    DIAGNOSIS: The encounter diagnosis was Squamous cell lung cancer, left ([place]).   PREVIOUS INVESTIGATIONS:  PET CT and CT scans reviewed Pathology report reviewed Clinical notes reviewed  HPI: patient is a 77 year old male who presented with a history of increasing productive cough. He was seen by pulmonology noted on chest x-ray to have a left lung mass as well as possible mediastinal adenopathy. CT scan of the chest in February 2019 showed retrohilar central lung mass with resulting mass effect on the left lower lobe bronchus worrisome for bronchogenic carcinoma. There was also subcarinal adenopathy noted. Patient does have underlying moderate central lobar emphysema. Patient underwent PET CT scan 05/10/2017 again showing hypermetabolic activity in the left lung perihilar region as well as right paratracheal and subcarinal nodal stations. Patient also incidentally had an infrarenal abdominal aortic aneurysm measuring 3.2 cm. Patient underwent EBUS bronchoscopy with pathology present for squamous cell carcinoma.patient has this head CT scan scheduled for later this week. He is been evaluated by medical oncology and recognition for concurrent chemoradiation with curative intent was made. He is seen today for revision oncology opinion. He is doing well. He still has a slight greenish cough. His appetite is decreased and he is losing some weight slowly. He is having no dysphagia.  PLANNED TREATMENT REGIMEN: concurrent chemoradiation  PAST MEDICAL HISTORY:  has a past medical history of Cancer  ([place]), Cataract, Elevated lipids, GERD (gastroesophageal reflux disease), HOH (hard of hearing), Hypertension, Pneumonia, Pulmonary nodules/lesions, multiple, and Shortness of breath dyspnea.    PAST SURGICAL HISTORY:  Past Surgical History:  Procedure Laterality Date  . BACK SURGERY     discectomy ,reports right foot drop since surgery   . CARDIAC CATHETERIZATION     2 stents  . CATARACT EXTRACTION W/PHACO Left 04/22/2015   Procedure: CATARACT EXTRACTION PHACO AND INTRAOCULAR LENS PLACEMENT (IOC);  Surgeon: Birder Robson, MD;  Location: [facility];  Service: Ophthalmology;  Laterality: Left;  [place]     1:17.8 AP%   25.7 CDE    20.03 fluid pack lot # [id number]  . COLONOSCOPY  2009  . COLONOSCOPY WITH PROPOFOL N/A 03/24/2016   Procedure: COLONOSCOPY WITH PROPOFOL;  Surgeon: Christene Lye, MD;  Location: [facility];  Service: Endoscopy;  Laterality: N/A;  . CORONARY ARTERY BYPASS GRAFT     single  . ENDOBRONCHIAL ULTRASOUND N/A 06/03/2017   Procedure: ENDOBRONCHIAL ULTRASOUND;  Surgeon: [name], MD;  Location: [facility];  Service: Pulmonary;  Laterality: N/A;  . ESOPHAGOGASTRODUODENOSCOPY (EGD) WITH PROPOFOL N/A 03/24/2016   Procedure: ESOPHAGOGASTRODUODENOSCOPY (EGD) WITH PROPOFOL;  Surgeon: Christene Lye, MD;  Location: [facility];  Service: Endoscopy;  Laterality: N/A;  . EYE SURGERY     bilateral  . LEFT HEART CATH AND CORONARY ANGIOGRAPHY Left 05/26/2016   Procedure: Left Heart Cath and Coronary Angiography;  Surgeon: Corey Skains, MD;  Location: [place] [facility];  Service: Cardiovascular;  Laterality: Left;    FAMILY HISTORY: family history includes Pancreatic cancer in his brother and paternal aunt.  SOCIAL HISTORY:  reports  that he has been smoking cigarettes.  He has a 60.00 pack-year smoking history. He has never used smokeless tobacco. He reports that he does not drink alcohol or use drugs.  ALLERGIES: Tramadol and  Lisinopril  MEDICATIONS:  Current Outpatient Medications  Medication Sig Dispense Refill  . amLODipine (NORVASC) 10 MG tablet Take 10 mg by mouth daily.    Marland Kitchen aspirin EC 81 MG tablet Take 81 mg by mouth daily.    Marland Kitchen atorvastatin (LIPITOR) 10 MG tablet Take 10 mg by mouth daily at 6 PM.    . atorvastatin (LIPITOR) 40 MG tablet Take 40 mg by mouth daily.    . carvedilol (COREG) 3.125 MG tablet Take 2 tablets by mouth 2 (two) times daily.    Marland Kitchen docusate sodium (COLACE) 100 MG capsule Take 100 mg by mouth daily.    . fluticasone furoate-vilanterol (BREO ELLIPTA) 100-25 MCG/INH AEPB Inhale 1 puff into the lungs daily.    . isosorbide mononitrate (IMDUR) 60 MG 24 hr tablet Take 60 mg by mouth daily.    Marland Kitchen losartan (COZAAR) 100 MG tablet Take 100 mg by mouth daily.     . methocarbamol (ROBAXIN) 500 MG tablet Take 500 mg by mouth 2 (two) times daily.    . nitroGLYCERIN (NITROSTAT) 0.4 MG SL tablet Place 0.4 mg under the tongue every 5 (five) minutes as needed for chest pain.    Marland Kitchen omeprazole (PRILOSEC) 20 MG capsule Take 20 mg by mouth 2 (two) times daily before a meal.     . polyethylene glycol powder (GLYCOLAX/MIRALAX) powder 255 grams one bottle for colonoscopy prep 255 g 0  . torsemide (DEMADEX) 20 MG tablet Take 20 mg by mouth daily. Take on M, W, F if weight is >170lb    . vitamin B-12 (CYANOCOBALAMIN) 500 MCG tablet Take 1,000 mcg by mouth daily.      No current facility-administered medications for this encounter.     ECOG PERFORMANCE STATUS:  1 - Symptomatic but completely ambulatory  REVIEW OF SYSTEMS: except for the slight cough and decreased appetite Patient denies any weight loss, fatigue, weakness, fever, chills or night sweats. Patient denies any loss of vision, blurred vision. Patient denies any ringing  of the ears or hearing loss. No irregular heartbeat. Patient denies heart murmur or history of fainting. Patient denies any chest pain or pain radiating to her upper extremities.  Patient denies any shortness of breath, difficulty breathing at night, cough or hemoptysis. Patient denies any swelling in the lower legs. Patient denies any nausea vomiting, vomiting of blood, or coffee ground material in the vomitus. Patient denies any stomach pain. Patient states has had normal bowel movements no significant constipation or diarrhea. Patient denies any dysuria, hematuria or significant nocturia. Patient denies any problems walking, swelling in the joints or loss of balance. Patient denies any skin changes, loss of hair or loss of weight. Patient denies any excessive worrying or anxiety or significant depression. Patient denies any problems with insomnia. Patient denies excessive thirst, polyuria, polydipsia. Patient denies any swollen glands, patient denies easy bruising or easy bleeding. Patient denies any recent infections, allergies or URI. Patient "s visual fields have not changed significantly in recent time.    PHYSICAL EXAM: BP (!) 120/58   Temp 98 F (36.7 C)   Resp 18   Wt 172 lb 6.4 oz (78.2 kg)   BMI 23.71 kg/m  Well-developed well-nourished patient in NAD. HEENT reveals PERLA, EOMI, discs not visualized.  Oral cavity is clear.  No oral mucosal lesions are identified. Neck is clear without evidence of cervical or supraclavicular adenopathy. Lungs are clear to A&P. Cardiac examination is essentially unremarkable with regular rate and rhythm without murmur rub or thrill. Abdomen is benign with no organomegaly or masses noted. Motor sensory and DTR levels are equal and symmetric in the upper and lower extremities. Cranial nerves II through XII are grossly intact. Proprioception is intact. No peripheral adenopathy or edema is identified. No motor or sensory levels are noted. Crude visual fields are within normal range.  LABORATORY DATA: pathology reports reviewed    RADIOLOGY RESULTS:CT scans and PET/CT scan reviewed head CT scan will be reviewed when  available   IMPRESSION: stage IIIB squamous cell carcinoma the left lung hilum in 77 year old male  PLAN: this time would recommend concurrent chemoradiation. Based on the contralateral lymph node involvement would opt to treat a split course fashion. Would treat up to 4000 cGy over 4 weeks boosting another 2600 cGy should we see appreciable response. I will wait final results of his CT scan of his head before making definitive treatment plans. Risks and benefits of radiation including possible dysphasia fatigue alteration of blood counts skin reaction loss of normal lung volume all were discussed in detail with the patient and his son. I have personally set up and ordered CT simulation for early next week after his port is placed.There will be extra effort by both professional staff as well as technical staff to coordinate and manage concurrent chemoradiation and ensuing side effects during his treatments.  Patient and son both seem to comprehend my treatment plan well.  I would like to take this opportunity to thank you for allowing me to participate in the care of your patient.Noreene Filbert, MD

## 2017-06-14 ENCOUNTER — Other Ambulatory Visit (INDEPENDENT_AMBULATORY_CARE_PROVIDER_SITE_OTHER): Payer: Self-pay | Admitting: Vascular Surgery

## 2017-06-14 ENCOUNTER — Telehealth: Payer: Self-pay | Admitting: *Deleted

## 2017-06-14 ENCOUNTER — Ambulatory Visit: Payer: Medicare Other | Admitting: Internal Medicine

## 2017-06-14 ENCOUNTER — Encounter (INDEPENDENT_AMBULATORY_CARE_PROVIDER_SITE_OTHER): Payer: Self-pay

## 2017-06-14 DIAGNOSIS — C3490 Malignant neoplasm of unspecified part of unspecified bronchus or lung: Secondary | ICD-10-CM
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
## 2017-06-14 NOTE — Progress Notes (Signed)
  Oncology Nurse Navigator Documentation  Navigator Location: [facility] (06/13/17 1600)   )Navigator Encounter Type: Initial RadOnc (06/13/17 1600)                       Treatment Phase: Pre-Tx/Tx Discussion (06/13/17 1600) Barriers/Navigation Needs: No barriers at this time (06/13/17 1600)   Interventions: None required (06/13/17 1600)               met with patient and his son during initial rad-onc consultation with Dr. Baruch Gouty. All questions answered at the time of visit. Reviewed upcoming appts with patient. Pt informed that will be scheduled for chemo once he is scheduled for radiation which we will know at appt for CT sim. Pt verbalized understanding. Nothing further needed at this time.       Time Spent with Patient: 30 (06/13/17 1600)

## 2017-06-15 NOTE — Patient Instructions (Signed)

## 2017-06-16 ENCOUNTER — Inpatient Hospital Stay: Payer: Medicare Other

## 2017-06-16 ENCOUNTER — Encounter: Payer: Self-pay | Admitting: *Deleted

## 2017-06-16 ENCOUNTER — Other Ambulatory Visit: Payer: Medicare Other

## 2017-06-16 NOTE — Progress Notes (Signed)
  Oncology Nurse Navigator Documentation  Navigator Location: [facility] (06/16/17 1000)   )Navigator Encounter Type: Lobby (06/16/17 1000)                         Barriers/Navigation Needs: Coordination of Care (06/16/17 1000)   Interventions: Coordination of Care (06/16/17 1000)   Coordination of Care: Appts;Radiology (06/16/17 1000)         met with patient prior to chemo education class today to inform him of appt changes. Pt informed that head CT scheduled on 4/19 was cancelled and brain MRI has been scheduled instead on Tues 4/23 prior to CT simulation appt. Pt given print out of appts and reinforced that his appt for head CT was cancelled and that needs to go to appt on Tues 4/23 for brain MRI. Pt instructed to [facility] 12[facility]pm [facility] mall for brain MRI then once done can arrive at the [facility] for his simulation appt. Pt verbalized understanding. Nothing further needed at this time.         Time Spent with Patient: 15 (06/16/17 1000)

## 2017-06-17 ENCOUNTER — Ambulatory Visit: Payer: Medicare Other

## 2017-06-17 ENCOUNTER — Ambulatory Visit: Admission: RE | Admit: 2017-06-17 | Payer: Medicare Other | Source: Ambulatory Visit | Admitting: Vascular Surgery

## 2017-06-19 MED ORDER — CEFAZOLIN SODIUM-DEXTROSE 2-4 GM/100ML-% IV SOLN: 2.0000 g | Freq: Once | INTRAVENOUS | Status: DC

## 2017-06-20 ENCOUNTER — Ambulatory Visit: Payer: Medicare Other

## 2017-06-20 ENCOUNTER — Telehealth: Payer: Self-pay | Admitting: Oncology

## 2017-06-20 ENCOUNTER — Emergency Department: Payer: Medicare Other

## 2017-06-20 ENCOUNTER — Inpatient Hospital Stay
Admission: EM | Admit: 2017-06-20 | Discharge: 2017-06-26 | DRG: 206 | Disposition: A | Discharge status: Home / Self Care | Payer: Medicare Other | Attending: Internal Medicine | Admitting: Internal Medicine

## 2017-06-20 ENCOUNTER — Other Ambulatory Visit: Payer: Self-pay

## 2017-06-20 DIAGNOSIS — R042 Hemoptysis: Secondary | ICD-10-CM | POA: Diagnosis present

## 2017-06-20 DIAGNOSIS — N289 Disorder of kidney and ureter, unspecified: Secondary | ICD-10-CM

## 2017-06-20 DIAGNOSIS — R197 Diarrhea, unspecified: Secondary | ICD-10-CM

## 2017-06-20 DIAGNOSIS — E861 Hypovolemia: Secondary | ICD-10-CM | POA: Diagnosis not present

## 2017-06-20 DIAGNOSIS — R778 Other specified abnormalities of plasma proteins: Secondary | ICD-10-CM | POA: Diagnosis present

## 2017-06-20 DIAGNOSIS — E860 Dehydration: Secondary | ICD-10-CM | POA: Diagnosis present

## 2017-06-20 DIAGNOSIS — I248 Other forms of acute ischemic heart disease: Secondary | ICD-10-CM | POA: Diagnosis present

## 2017-06-20 DIAGNOSIS — J449 Chronic obstructive pulmonary disease, unspecified: Secondary | ICD-10-CM | POA: Diagnosis present

## 2017-06-20 DIAGNOSIS — Y838 Other surgical procedures as the cause of abnormal reaction of the patient, or of later complication, without mention of misadventure at the time of the procedure: Secondary | ICD-10-CM | POA: Diagnosis present

## 2017-06-20 DIAGNOSIS — I959 Hypotension, unspecified: Secondary | ICD-10-CM | POA: Diagnosis present

## 2017-06-20 DIAGNOSIS — E876 Hypokalemia: Secondary | ICD-10-CM | POA: Diagnosis present

## 2017-06-20 DIAGNOSIS — R001 Bradycardia, unspecified: Secondary | ICD-10-CM | POA: Diagnosis not present

## 2017-06-20 DIAGNOSIS — E785 Hyperlipidemia, unspecified: Secondary | ICD-10-CM | POA: Diagnosis present

## 2017-06-20 DIAGNOSIS — I5030 Unspecified diastolic (congestive) heart failure: Secondary | ICD-10-CM | POA: Diagnosis present

## 2017-06-20 DIAGNOSIS — R7989 Other specified abnormal findings of blood chemistry: Secondary | ICD-10-CM | POA: Diagnosis present

## 2017-06-20 DIAGNOSIS — N183 Chronic kidney disease, stage 3 unspecified: Secondary | ICD-10-CM | POA: Diagnosis present

## 2017-06-20 DIAGNOSIS — K219 Gastro-esophageal reflux disease without esophagitis: Secondary | ICD-10-CM | POA: Diagnosis present

## 2017-06-20 DIAGNOSIS — H9190 Unspecified hearing loss, unspecified ear: Secondary | ICD-10-CM | POA: Diagnosis present

## 2017-06-20 DIAGNOSIS — Z7982 Long term (current) use of aspirin: Secondary | ICD-10-CM | POA: Diagnosis not present

## 2017-06-20 DIAGNOSIS — C3490 Malignant neoplasm of unspecified part of unspecified bronchus or lung: Secondary | ICD-10-CM | POA: Diagnosis not present

## 2017-06-20 DIAGNOSIS — I2510 Atherosclerotic heart disease of native coronary artery without angina pectoris: Secondary | ICD-10-CM | POA: Diagnosis present

## 2017-06-20 DIAGNOSIS — Z8546 Personal history of malignant neoplasm of prostate: Secondary | ICD-10-CM

## 2017-06-20 DIAGNOSIS — J9589 Other postprocedural complications and disorders of respiratory system, not elsewhere classified: Principal | ICD-10-CM | POA: Diagnosis present

## 2017-06-20 DIAGNOSIS — C3492 Malignant neoplasm of unspecified part of left bronchus or lung: Secondary | ICD-10-CM | POA: Diagnosis present

## 2017-06-20 DIAGNOSIS — Z79899 Other long term (current) drug therapy: Secondary | ICD-10-CM | POA: Diagnosis not present

## 2017-06-20 DIAGNOSIS — N281 Cyst of kidney, acquired: Secondary | ICD-10-CM | POA: Diagnosis present

## 2017-06-20 DIAGNOSIS — N179 Acute kidney failure, unspecified: Secondary | ICD-10-CM | POA: Diagnosis present

## 2017-06-20 DIAGNOSIS — F17210 Nicotine dependence, cigarettes, uncomplicated: Secondary | ICD-10-CM | POA: Diagnosis present

## 2017-06-20 DIAGNOSIS — Z885 Allergy status to narcotic agent status: Secondary | ICD-10-CM

## 2017-06-20 DIAGNOSIS — Z888 Allergy status to other drugs, medicaments and biological substances status: Secondary | ICD-10-CM

## 2017-06-20 DIAGNOSIS — E872 Acidosis: Secondary | ICD-10-CM | POA: Diagnosis present

## 2017-06-20 DIAGNOSIS — I10 Essential (primary) hypertension: Secondary | ICD-10-CM | POA: Diagnosis present

## 2017-06-20 DIAGNOSIS — I130 Hypertensive heart and chronic kidney disease with heart failure and stage 1 through stage 4 chronic kidney disease, or unspecified chronic kidney disease: Secondary | ICD-10-CM | POA: Diagnosis present

## 2017-06-20 DIAGNOSIS — Z951 Presence of aortocoronary bypass graft: Secondary | ICD-10-CM

## 2017-06-20 LAB — CBC
HCT: 33.2 % — ABNORMAL LOW (ref 40.0–52.0)
Hemoglobin: 11.1 g/dL — ABNORMAL LOW (ref 13.0–18.0)
MCH: 24 pg — AB (ref 26.0–34.0)
MCHC: 33.3 g/dL (ref 32.0–36.0)
MCV: 71.9 fL — AB (ref 80.0–100.0)
PLATELETS: 279 10*3/uL (ref 150–440)
RBC: 4.62 MIL/uL (ref 4.40–5.90)
RDW: 15.1 % — ABNORMAL HIGH (ref 11.5–14.5)
WBC: 14.1 10*3/uL — AB (ref 3.8–10.6)

## 2017-06-20 LAB — BASIC METABOLIC PANEL
Anion gap: 8 (ref 5–15)
BUN: 25 mg/dL — ABNORMAL HIGH (ref 6–20)
CALCIUM: 8.1 mg/dL — AB (ref 8.9–10.3)
CHLORIDE: 103 mmol/L (ref 101–111)
CO2: 23 mmol/L (ref 22–32)
CREATININE: 2.7 mg/dL — AB (ref 0.61–1.24)
GFR calc non Af Amer: 21 mL/min — ABNORMAL LOW (ref >60)
GFR, EST AFRICAN AMERICAN: 25 mL/min — AB (ref >60)
Glucose, Bld: 80 mg/dL (ref 65–99)
Potassium: 3.2 mmol/L — ABNORMAL LOW (ref 3.5–5.1)
SODIUM: 134 mmol/L — AB (ref 135–145)

## 2017-06-20 LAB — TROPONIN I: Troponin I: 0.14 ng/mL (ref <0.03)

## 2017-06-20 SURGERY — PORTA CATH INSERTION
Anesthesia: Moderate Sedation

## 2017-06-20 MED ORDER — SODIUM CHLORIDE 0.9 % IV BOLUS
500.0000 mL | Freq: Once | INTRAVENOUS | Status: AC
  Administered 2017-06-20: 500 mL via INTRAVENOUS

## 2017-06-20 NOTE — ED Provider Notes (Signed)
[facility] Emergency Department Provider Note ____________________________________________   First MD Initiated Contact with Patient 06/20/17 1740     (approximate)  I have reviewed the triage vital signs and the nursing notes.   HISTORY  Chief Complaint Dizziness and Weakness    HPI Billy Ortiz is a 77 y.o. male with PMH as noted below including recent diagnosis of lung cancer (patient has not yet started chemotherapy or radiation) who presents with generalized weakness over the last 3 days, gradual onset, associated with lightheadedness, as well as with several episodes of vomiting, and diarrhea.  Patient also states that over the last days developed some hemoptysis.  He states he has had decreased p.o. intake.  He denies fever, abdominal pain, chest pain, or headache.  No change in mental status per the patient or his son.   Past Medical History:  Diagnosis Date  . Cancer [facility])    prostate  . Cataract   . Elevated lipids   . GERD (gastroesophageal reflux disease)   . HOH (hard of hearing)   . Hypertension   . Pneumonia   . Pulmonary nodules/lesions, multiple   . Shortness of breath dyspnea     Patient Active Problem List   Diagnosis Date Noted  . Squamous cell lung cancer, left ([place]) 06/10/2017  . Personal history of prostate cancer 05/24/2017  . (HFpEF) heart failure with preserved ejection fraction ([place]) 04/11/2017  . Claudication of right lower extremity ([place]) 04/11/2017  . Drug-induced constipation 04/11/2017  . Rash 04/11/2017  . History of nephrolithiasis 03/03/2017  . Renal cyst, acquired 03/03/2017  . Acute blood loss anemia 06/11/2016  . 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 Stable angina ([place]) 04/07/2016  . CKD (chronic kidney disease) stage 3, GFR 30-59 ml/min (HCC) 02/25/2016  . Inguinal hernia 01/30/2016  . Lumbosacral radiculitis 01/30/2016  . 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 Benign essential HTN 03/06/2015  .  Bilateral carotid artery stenosis 03/06/2015  . Hyperlipidemia, unspecified 03/06/2015  . History of recurrent pneumonia 07/17/2014  . 1st degree AV block 07/08/2014  . CAD (coronary artery disease) 07/08/2014  . Pseudophakia of right eye 07/08/2014  . DDD (degenerative disc disease), lumbar 07/08/2014  . Hypoglycemia 07/08/2014  . Pneumonia 07/08/2014  . Pulmonary nodules 07/08/2014  . Thoracic aortic aneurysm without rupture ([place]) 07/08/2014  . Tobacco use 07/08/2014  . Disease of lung 10/24/2013  . 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 Moderate COPD (chronic obstructive pulmonary disease) ([place]) 10/24/2013  . Malignant neoplasm of prostate ([place]) 09/23/2008  . Nicotine dependence, uncomplicated 17/61/6073  . Gastro-esophageal reflux disease without esophagitis 03/02/2006    Past Surgical History:  Procedure Laterality Date  . BACK SURGERY     discectomy ,reports right foot drop since surgery   . CARDIAC CATHETERIZATION     2 stents  . CATARACT EXTRACTION W/PHACO Left 04/22/2015   Procedure: CATARACT EXTRACTION PHACO AND INTRAOCULAR LENS PLACEMENT (IOC);  Surgeon: Birder Robson, MD;  Location: [facility] ORS;  Service: Ophthalmology;  Laterality: Left;  [place]     1:17.8 AP%   25.7 CDE    20.03 fluid pack lot # [id number]  . COLONOSCOPY  2009  . COLONOSCOPY WITH PROPOFOL N/A 03/24/2016   Procedure: COLONOSCOPY WITH PROPOFOL;  Surgeon: Christene Lye, MD;  Location: [facility] ENDOSCOPY;  Service: Endoscopy;  Laterality: N/A;  . CORONARY ARTERY BYPASS GRAFT     single  . ENDOBRONCHIAL ULTRASOUND N/A 06/03/2017   Procedure: ENDOBRONCHIAL ULTRASOUND;  Surgeon: [name], MD;  Location: [facility] ORS;  Service: Pulmonary;  Laterality: N/A;  . ESOPHAGOGASTRODUODENOSCOPY (EGD) WITH PROPOFOL N/A 03/24/2016   Procedure: ESOPHAGOGASTRODUODENOSCOPY (EGD) WITH PROPOFOL;  Surgeon: Christene Lye, MD;  Location: [facility] ENDOSCOPY;  Service: Endoscopy;  Laterality: N/A;  . EYE  SURGERY     bilateral  . LEFT HEART CATH AND CORONARY ANGIOGRAPHY Left 05/26/2016   Procedure: Left Heart Cath and Coronary Angiography;  Surgeon: Corey Skains, MD;  Location: [place] [facility];  Service: Cardiovascular;  Laterality: Left;    Prior to Admission medications   Medication Sig Start Date End Date Taking? Authorizing Provider  amLODipine (NORVASC) 10 MG tablet Take 10 mg by mouth daily.    [provider]  aspirin EC 81 MG tablet Take 81 mg by mouth daily.    [provider]  atorvastatin (LIPITOR) 10 MG tablet Take 10 mg by mouth daily at 6 PM.    [provider]  atorvastatin (LIPITOR) 40 MG tablet Take 40 mg by mouth daily.    [provider]  carvedilol (COREG) 3.125 MG tablet Take 2 tablets by mouth 2 (two) times daily. 04/04/17   [provider]  docusate sodium (COLACE) 100 MG capsule Take 100 mg by mouth daily.    [provider]  fluticasone furoate-vilanterol (BREO ELLIPTA) 100-25 MCG/INH AEPB Inhale 1 puff into the lungs daily.    [provider]  isosorbide mononitrate (IMDUR) 60 MG 24 hr tablet Take 60 mg by mouth daily. 02/24/17   [provider]  losartan (COZAAR) 100 MG tablet Take 100 mg by mouth daily.     [provider]  methocarbamol (ROBAXIN) 500 MG tablet Take 500 mg by mouth 2 (two) times daily.    [provider]  nitroGLYCERIN (NITROSTAT) 0.4 MG SL tablet Place 0.4 mg under the tongue every 5 (five) minutes as needed for chest pain.    [provider]  omeprazole (PRILOSEC) 20 MG capsule Take 20 mg by mouth 2 (two) times daily before a meal.     [provider]  polyethylene glycol powder (GLYCOLAX/MIRALAX) powder 255 grams one bottle for colonoscopy prep 02/18/16   Christene Lye, MD  torsemide (DEMADEX) 20 MG tablet Take 20 mg by mouth daily. Take on M, W, F if weight is >170lb    [provider]  vitamin B-12 (CYANOCOBALAMIN)  500 MCG tablet Take 1,000 mcg by mouth daily.     [provider]    Allergies Tramadol and Lisinopril  Family History  Problem Relation Age of Onset  . Pancreatic cancer Brother   . Pancreatic cancer Paternal Aunt     Social History Social History   Tobacco Use  . Smoking status: Current Every Day Smoker    Packs/day: 1.00    Years: 60.00    Pack years: 60.00    Types: Cigarettes  . Smokeless tobacco: Never Used  Substance Use Topics  . Alcohol use: No  . Drug use: No    Review of Systems  Constitutional: No fever.  Positive for generalized weakness. Eyes: No redness. ENT: No sore throat. Cardiovascular: Denies chest pain. Respiratory: Denies shortness of breath. Gastrointestinal: Positive for vomiting and diarrhea.  Genitourinary: Negative for dysuria.  Musculoskeletal: Negative for back pain. Skin: Negative for rash. Neurological: Negative for headache.   ____________________________________________   PHYSICAL EXAM:  VITAL SIGNS: ED Triage Vitals  Enc Vitals Group     BP 06/20/17 1726 (!) 153/79  Pulse Rate 06/20/17 1726 78     Resp 06/20/17 1726 (!) 23     Temp --      Temp src --      SpO2 06/20/17 1725 96 %     Weight 06/20/17 1729 177 lb (80.3 kg)     Height 06/20/17 1729 5\' 11"  (1.803 m)     Head Circumference --      Peak Flow --      Pain Score 06/20/17 1727 3     Pain Loc --      Pain Edu? --      Excl. in [place]? --     Constitutional: Alert and oriented. Well appearing and in no acute distress. Eyes: Conjunctivae are normal.  EOMI.  PERRLA. Head: Atraumatic. Nose: No congestion/rhinnorhea. Mouth/Throat: Mucous membranes are somewhat dry.   Neck: Normal range of motion.  Cardiovascular: Normal rate, regular rhythm. Grossly normal heart sounds.  Good peripheral circulation. Respiratory: Normal respiratory effort.  No retractions. Lungs CTAB. Gastrointestinal: Soft and nontender. No distention.  Genitourinary: No flank  tenderness. Musculoskeletal: No lower extremity edema.  Extremities warm and well perfused.  Neurologic:  Normal speech and language. No gross focal neurologic deficits are appreciated.  Skin:  Skin is warm and dry. No rash noted. Psychiatric: Mood and affect are normal. Speech and behavior are normal.  ____________________________________________   LABS (all labs ordered are listed, but only abnormal results are displayed)  Labs Reviewed  BASIC METABOLIC PANEL - Abnormal; Notable for the following components:      Result Value   Sodium 134 (*)    Potassium 3.2 (*)    BUN 25 (*)    Creatinine, Ser 2.70 (*)    Calcium 8.1 (*)    GFR calc non Af Amer 21 (*)    GFR calc Af Amer 25 (*)    All other components within normal limits  CBC - Abnormal; Notable for the following components:   WBC 14.1 (*)    Hemoglobin 11.1 (*)    HCT 33.2 (*)    MCV 71.9 (*)    MCH 24.0 (*)    RDW 15.1 (*)    All other components within normal limits  TROPONIN I - Abnormal; Notable for the following components:   Troponin I 0.14 (*)    All other components within normal limits  PROTIME-INR  BRAIN NATRIURETIC PEPTIDE  URINALYSIS, COMPLETE (UACMP) WITH MICROSCOPIC  HEPATIC FUNCTION PANEL  LIPASE, BLOOD   ____________________________________________  EKG  ED ECG REPORT I, Arta Silence, the attending physician, personally viewed and interpreted this ECG.  Date: 06/20/2017 EKG Time: 1722 Rate: 80 Rhythm: normal sinus rhythm QRS Axis: normal Intervals: Prolonged PR, LAFB ST/T Wave abnormalities: LVH Narrative Interpretation: no evidence of acute ischemia  ____________________________________________  RADIOLOGY  CXR: Cardiomegaly; possible slight worsening vascular congestion  ____________________________________________   PROCEDURES  Procedure(s) performed: No  Procedures  Critical Care performed: No ____________________________________________   INITIAL IMPRESSION /  ASSESSMENT AND PLAN / ED COURSE  Pertinent labs & imaging results that were available during my care of the patient were reviewed by me and considered in my medical decision making (see chart for details).  77 year old male with PMH as noted above including recent diagnosis of lung CA presents with multiple symptoms, but primarily generalized weakness, dizziness, and vomiting and diarrhea over the last several days, and was found to be hypotensive on EMS arrival.  On exam here, vital signs are normal, abdomen is soft and nontender,  neuro exam is nonfocal, and the remainder the exam is as described above.    Differential includes most likely infectious causes, including gastroenteritis, UTI, or less likely respiratory source.  Also consider metabolic derangements, or less likely cardiac etiology.  Plan: IV fluids, lab and infection work-up, chest x-ray, and reassess.    Clinical Course as of Jun 20 2112  Mon Jun 20, 2017  2055 Chloride: 103 [SS]    Clinical Course User Index [SS] Arta Silence, MD   Lab work-up reveals elevated creatinine, elevated WBC count, and somewhat elevated troponin.  Some of the additional work-up is still pending.  Given these findings, patient's hypotension earlier, and his persistent symptoms, he will require admission.  ----------------------------------------- 11:21 PM on 06/20/2017 -----------------------------------------  I signed the patient out to the hospitalist Dr. Jannifer Franklin.  ____________________________________________   FINAL CLINICAL IMPRESSION(S) / ED DIAGNOSES  Final diagnoses:  Dehydration  Diarrhea, unspecified type  Acute renal insufficiency  Hemoptysis      NEW MEDICATIONS STARTED DURING THIS VISIT:  New Prescriptions   No medications on file     Note:  This document was prepared using Dragon voice recognition software and may include unintentional dictation errors.    Arta Silence, MD 06/20/17 2321

## 2017-06-20 NOTE — ED Notes (Signed)
Date and time results received: 06/20/17 1841 (use smartphrase ".now" to insert current time)  Test: Troponin Critical Value: 0.14  Name of Provider Notified: Dr. Cherylann Banas  Orders Received? Or Actions Taken?:

## 2017-06-20 NOTE — ED Triage Notes (Signed)
Pt recently dx'd w/ Lung CA. Pt had lung biopsy x 3 weeks ago. In past three days, pt has had decreased PO intake, increased nausea and weakness, increased dizziness. Upon EMS arrival and evaluation, pt found to be hypotensive w/ SBP 78 palp. PIV established and NS 1L administered IV. Pt had brief episode of a-fib w/ rvr per EMS. Pt converted back to NSR w/o intervention. Pt is to have PICC line placed tomorrow here at this facility.

## 2017-06-20 NOTE — H&P (Signed)
Billy Ortiz NAME: Billy Ortiz    MR#:  [id number]  DATE OF BIRTH:  [birth year]  DATE OF ADMISSION:  06/20/2017  PRIMARY CARE PHYSICIAN: [facility] [place]   REQUESTING/REFERRING PHYSICIAN: Cherylann Banas, MD  CHIEF COMPLAINT:   Chief Complaint  Patient presents with  . Dizziness  . Weakness    HISTORY OF PRESENT ILLNESS:  Billy Ortiz  is a 77 y.o. male who presents with weakness, cough, hemoptysis.  Patient has lung cancer and recently had bronchoscopy with biopsy.  Since that time he started having more cough and over the last several days has developed hemoptysis.  Hemoglobin is stable, and work-up in the ED does not seem to indicate infectious pathology.  Patient does have some mild AKI, and given his hemoptysis and his weakness hospitalist were called for admission and further evaluation  PAST MEDICAL HISTORY:   Past Medical History:  Diagnosis Date  . Cancer [facility])    prostate  . Cataract   . Elevated lipids   . GERD (gastroesophageal reflux disease)   . HOH (hard of hearing)   . Hypertension   . Pneumonia   . Pulmonary nodules/lesions, multiple   . Shortness of breath dyspnea      PAST SURGICAL HISTORY:   Past Surgical History:  Procedure Laterality Date  . BACK SURGERY     discectomy ,reports right foot drop since surgery   . CARDIAC CATHETERIZATION     2 stents  . CATARACT EXTRACTION W/PHACO Left 04/22/2015   Procedure: CATARACT EXTRACTION PHACO AND INTRAOCULAR LENS PLACEMENT (IOC);  Surgeon: Birder Robson, MD;  Location: [facility] ORS;  Service: Ophthalmology;  Laterality: Left;  [place]     1:17.8 AP%   25.7 CDE    20.03 fluid pack lot # [id number]  . COLONOSCOPY  2009  . COLONOSCOPY WITH PROPOFOL N/A 03/24/2016   Procedure: COLONOSCOPY WITH PROPOFOL;  Surgeon: Christene Lye, MD;  Location: [facility] END[facility];  Service: Endoscopy;  Laterality: N/A;  . CORONARY ARTERY BYPASS GRAFT     single   . ENDOBRONCHIAL ULTRASOUND N/A 06/03/2017   Procedure: ENDOBRONCHIAL ULTRASOUND;  Surgeon: [name], MD;  Location: [facility] ORS;  Service: Pulmonary;  Laterality: N/A;  . ESOPHAGOGASTRODUODENOSCOPY (EGD) WITH PROPOFOL N/A 03/24/2016   Procedure: ESOPHAGOGASTRODUODENOSCOPY (EGD) WITH PROPOFOL;  Surgeon: Christene Lye, MD;  Location: [facility];  Service: Endoscopy;  Laterality: N/A;  . EYE SURGERY     bilateral  . LEFT HEART CATH AND CORONARY ANGIOGRAPHY Left 05/26/2016   Procedure: Left Heart Cath and Coronary Angiography;  Surgeon: Corey Skains, MD;  Location: [place] [facility];  Service: Cardiovascular;  Laterality: Left;     SOCIAL HISTORY:   Social History   Tobacco Use  . Smoking status: Current Every Day Smoker    Packs/day: 1.00    Years: 60.00    Pack years: 60.00    Types: Cigarettes  . Smokeless tobacco: Never Used  Substance Use Topics  . Alcohol use: No     FAMILY HISTORY:   Family History  Problem Relation Age of Onset  . Pancreatic cancer Brother   . Pancreatic cancer Paternal Aunt      DRUG ALLERGIES:   Allergies  Allergen Reactions  . Tramadol Hives  . Lisinopril     Other reaction(s): Unknown Other reaction(s): Unknown    MEDICATIONS AT HOME:   Prior to Admission medications   Medication Sig Start Date End Date Taking? Authorizing  Provider  amLODipine (NORVASC) 10 MG tablet Take 10 mg by mouth daily.   Yes [provider]  aspirin EC 81 MG tablet Take 81 mg by mouth daily.   Yes [provider]  atorvastatin (LIPITOR) 40 MG tablet Take 40 mg by mouth daily at 6 PM.    Yes [provider]  carvedilol (COREG) 3.125 MG tablet Take 2 tablets by mouth 2 (two) times daily. 04/04/17  Yes [provider]  docusate sodium (COLACE) 100 MG capsule Take 100 mg by mouth daily.   Yes [provider]  fluticasone furoate-vilanterol (BREO ELLIPTA) 100-25 MCG/INH AEPB Inhale 1 puff into the  lungs daily.   Yes [provider]  isosorbide mononitrate (IMDUR) 60 MG 24 hr tablet Take 60 mg by mouth daily. 02/24/17  Yes [provider]  losartan (COZAAR) 100 MG tablet Take 100 mg by mouth daily.    Yes [provider]  methocarbamol (ROBAXIN) 500 MG tablet Take 500 mg by mouth 2 (two) times daily.   Yes [provider]  nitroGLYCERIN (NITROSTAT) 0.4 MG SL tablet Place 0.4 mg under the tongue every 5 (five) minutes as needed for chest pain.   Yes [provider]  omeprazole (PRILOSEC) 20 MG capsule Take 20 mg by mouth 2 (two) times daily before a meal.    Yes [provider]  torsemide (DEMADEX) 20 MG tablet Take 20 mg by mouth daily as needed (fluid).    Yes [provider]  vitamin B-12 (CYANOCOBALAMIN) 500 MCG tablet Take 1,000 mcg by mouth daily.    Yes [provider]  polyethylene glycol powder (GLYCOLAX/MIRALAX) powder 255 grams one bottle for colonoscopy prep Patient not taking: Reported on 06/20/2017 02/18/16   Christene Lye, MD    REVIEW OF SYSTEMS:  Review of Systems  Constitutional: Negative for chills, fever, malaise/fatigue and weight loss.  HENT: Negative for ear pain, hearing loss and tinnitus.   Eyes: Negative for blurred vision, double vision, pain and redness.  Respiratory: Positive for cough and hemoptysis. Negative for shortness of breath.   Cardiovascular: Negative for chest pain, palpitations, orthopnea and leg swelling.  Gastrointestinal: Negative for abdominal pain, constipation, diarrhea, nausea and vomiting.  Genitourinary: Negative for dysuria, frequency and hematuria.  Musculoskeletal: Negative for back pain, joint pain and neck pain.  Skin:       No acne, rash, or lesions  Neurological: Positive for weakness. Negative for dizziness, tremors and focal weakness.  Endo/Heme/Allergies: Negative for polydipsia. Does not bruise/bleed easily.  Psychiatric/Behavioral: Negative for  depression. The patient is not nervous/anxious and does not have insomnia.      VITAL SIGNS:   Vitals:   06/20/17 1930 06/20/17 2000 06/20/17 2030 06/20/17 2100  BP: (!) 148/84 130/73 136/82 (!) 143/88  Pulse: 70 78 74   Resp: (!) 25  (!) 29 19  SpO2: 95% 94% 94%   Weight:      Height:       Wt Readings from Last 3 Encounters:  06/20/17 80.3 kg (177 lb)  06/13/17 78.2 kg (172 lb 6.4 oz)  06/10/17 78.9 kg (174 lb)    PHYSICAL EXAMINATION:  Physical Exam  Vitals reviewed. Constitutional: He is oriented to person, place, and time. He appears well-developed and well-nourished. No distress.  HENT:  Head: Normocephalic and atraumatic.  Mouth/Throat: Oropharynx is clear and moist.  Eyes: Pupils are equal, round, and reactive to light. Conjunctivae and EOM are normal. No scleral icterus.  Neck: Normal range  of motion. Neck supple. No JVD present. No thyromegaly present.  Cardiovascular: Normal rate, regular rhythm and intact distal pulses. Exam reveals no gallop and no friction rub.  No murmur heard. Respiratory: Effort normal. No respiratory distress. He has no wheezes. He has no rales.  Left-sided coarse breath sounds  GI: Soft. Bowel sounds are normal. He exhibits no distension. There is no tenderness.  Musculoskeletal: Normal range of motion. He exhibits no edema.  No arthritis, no gout  Lymphadenopathy:    He has no cervical adenopathy.  Neurological: He is alert and oriented to person, place, and time. No cranial nerve deficit.  No dysarthria, no aphasia  Skin: Skin is warm and dry. No rash noted. No erythema.  Psychiatric: He has a normal mood and affect. His behavior is normal. Judgment and thought content normal.    LABORATORY PANEL:   CBC Recent Labs  Lab 06/20/17 1757  WBC 14.1*  HGB 11.1*  HCT 33.2*  PLT 279   ------------------------------------------------------------------------------------------------------------------  Chemistries  Recent Labs  Lab  06/20/17 1757  NA 134*  K 3.2*  CL 103  CO2 23  GLUCOSE 80  BUN 25*  CREATININE 2.70*  CALCIUM 8.1*   ------------------------------------------------------------------------------------------------------------------  Cardiac Enzymes Recent Labs  Lab 06/20/17 1757  TROPONINI 0.14*   ------------------------------------------------------------------------------------------------------------------  RADIOLOGY:  Dg Chest 2 View  Result Date: 06/20/2017 CLINICAL DATA:  Lung cancer, recent biopsy.  Weakness.  Hypotensive. EXAM: CHEST - 2 VIEW COMPARISON:  Prior CT 05/04/2017.  PET scan 05/10/2017. FINDINGS: Cardiomegaly. Median sternotomy for CABG. Mild vascular congestion. No consolidation or edema. LEFT perihilar lung lesion better seen on CT. No acute osseous findings. There may be slight worsening aeration compared with priors due to increasing vascular congestion. The IMPRESSION: Cardiomegaly. No consolidation or edema. There may be slight worsening vascular congestion. Electronically Signed   By: Staci Righter M.D.   On: 06/20/2017 18:03    EKG:   Orders placed or performed during the hospital encounter of 06/20/17  . ED EKG within 10 minutes  . ED EKG within 10 minutes    IMPRESSION AND PLAN:  Principal Problem:   Hemoptysis -unclear etiology, potentially related to biopsy, though this is uncertain.  Hemoglobin is stable.  We will get pulmonology consult. Active Problems:   Squamous cell lung cancer, left (HCC) -biopsy of the same possibly related to his hemoptysis, see above   Acute renal failure superimposed on stage 3 chronic kidney disease (HCC) -IV fluids, avoid nephrotoxins and monitor for improvement   Elevated troponin -unclear etiology, no complaint of chest pain or other cardiac symptoms, will cycle his cardiac enzymes tonight and get an echocardiogram.  Can consider cardiology consult if he has significant abnormality in the above tests   (HFpEF) heart failure  with preserved ejection fraction ([place]) -continue home meds   Benign essential HTN -Home medications   Gastro-esophageal reflux disease without esophagitis -home dose PPI  Chart review performed and case discussed with ED provider. Labs, imaging and/or ECG reviewed by provider and discussed with patient/family. Management plans discussed with the patient and/or family.  DVT PROPHYLAXIS: SubQ lovenox  GI PROPHYLAXIS: PPI  ADMISSION STATUS: Inpatient  CODE STATUS: Full Code Status History    Date Active Date Inactive Code Status Order ID Comments User Context   05/26/2016 1604 05/26/2016 2031 Full Code [id number]  Corey Skains, MD Inpatient      TOTAL TIME TAKING CARE OF THIS PATIENT: 45 minutes.   Billy Ortiz 06/20/2017, 10:44 PM  [place]  [id number]  CC: Primary care physician; [employer] [employer]  Note:  This document was prepared using Systems analyst and may include unintentional dictation errors.

## 2017-06-21 ENCOUNTER — Ambulatory Visit: Payer: Medicare Other

## 2017-06-21 ENCOUNTER — Inpatient Hospital Stay
Admit: 2017-06-21 | Discharge: 2017-06-21 | Disposition: A | Discharge status: Home / Self Care | Payer: Medicare Other | Attending: Internal Medicine | Admitting: Internal Medicine

## 2017-06-21 LAB — HEPATIC FUNCTION PANEL
ALBUMIN: 2.6 g/dL — AB (ref 3.5–5.0)
ALT: 9 U/L — ABNORMAL LOW (ref 17–63)
AST: 15 U/L (ref 15–41)
Alkaline Phosphatase: 94 U/L (ref 38–126)
BILIRUBIN TOTAL: 0.9 mg/dL (ref 0.3–1.2)
Bilirubin, Direct: 0.2 mg/dL (ref 0.1–0.5)
Indirect Bilirubin: 0.7 mg/dL (ref 0.3–0.9)
Total Protein: 6.9 g/dL (ref 6.5–8.1)

## 2017-06-21 LAB — CBC
HEMATOCRIT: 32 % — AB (ref 40.0–52.0)
HEMOGLOBIN: 10.6 g/dL — AB (ref 13.0–18.0)
MCH: 23.7 pg — ABNORMAL LOW (ref 26.0–34.0)
MCHC: 33.1 g/dL (ref 32.0–36.0)
MCV: 71.7 fL — ABNORMAL LOW (ref 80.0–100.0)
Platelets: 277 10*3/uL (ref 150–440)
RBC: 4.46 MIL/uL (ref 4.40–5.90)
RDW: 15.5 % — ABNORMAL HIGH (ref 11.5–14.5)
WBC: 13.2 10*3/uL — ABNORMAL HIGH (ref 3.8–10.6)

## 2017-06-21 LAB — COMPREHENSIVE METABOLIC PANEL
ALK PHOS: 97 U/L (ref 38–126)
ALT: 8 U/L — ABNORMAL LOW (ref 17–63)
ANION GAP: 6 (ref 5–15)
AST: 15 U/L (ref 15–41)
Albumin: 2.4 g/dL — ABNORMAL LOW (ref 3.5–5.0)
BILIRUBIN TOTAL: 0.9 mg/dL (ref 0.3–1.2)
BUN: 26 mg/dL — ABNORMAL HIGH (ref 6–20)
CALCIUM: 8 mg/dL — AB (ref 8.9–10.3)
CO2: 24 mmol/L (ref 22–32)
Chloride: 104 mmol/L (ref 101–111)
Creatinine, Ser: 2.91 mg/dL — ABNORMAL HIGH (ref 0.61–1.24)
GFR calc non Af Amer: 20 mL/min — ABNORMAL LOW (ref >60)
GFR, EST AFRICAN AMERICAN: 23 mL/min — AB (ref >60)
Glucose, Bld: 113 mg/dL — ABNORMAL HIGH (ref 65–99)
Potassium: 3.4 mmol/L — ABNORMAL LOW (ref 3.5–5.1)
SODIUM: 134 mmol/L — AB (ref 135–145)
TOTAL PROTEIN: 6.7 g/dL (ref 6.5–8.1)

## 2017-06-21 LAB — MAGNESIUM: Magnesium: 1 mg/dL — ABNORMAL LOW (ref 1.7–2.4)

## 2017-06-21 LAB — TROPONIN I
TROPONIN I: 0.25 ng/mL — AB (ref <0.03)
Troponin I: 0.34 ng/mL (ref <0.03)
Troponin I: 0.2 ng/mL (ref <0.03)

## 2017-06-21 LAB — BRAIN NATRIURETIC PEPTIDE: B NATRIURETIC PEPTIDE 5: 797 pg/mL — AB (ref 0.0–100.0)

## 2017-06-21 LAB — PROTIME-INR
INR: 1.14
Prothrombin Time: 14.5 seconds (ref 11.4–15.2)

## 2017-06-21 LAB — LIPASE, BLOOD: Lipase: 18 U/L (ref 11–51)

## 2017-06-21 MED ORDER — CARVEDILOL 3.125 MG PO TABS
6.2500 mg | ORAL_TABLET | Freq: Two times a day (BID) | ORAL | Status: DC
  Administered 2017-06-21 – 2017-06-22 (×3): 6.25 mg via ORAL
  Filled 2017-06-21 (×2): qty 2
  Filled 2017-06-21 (×4): qty 1
  Filled 2017-06-21: qty 2

## 2017-06-21 MED ORDER — HYDRALAZINE HCL 20 MG/ML IJ SOLN
10.0000 mg | INTRAMUSCULAR | Status: DC | PRN
  Administered 2017-06-26: 07:00:00 10 mg via INTRAVENOUS
  Filled 2017-06-21 (×2): qty 1

## 2017-06-21 MED ORDER — MAGNESIUM SULFATE 4 GM/100ML IV SOLN
4.0000 g | Freq: Once | INTRAVENOUS | Status: DC
  Filled 2017-06-21: qty 100

## 2017-06-21 MED ORDER — TORSEMIDE 20 MG PO TABS: 20.0000 mg | ORAL_TABLET | Freq: Every day | ORAL | Status: DC | PRN

## 2017-06-21 MED ORDER — BENZONATATE 100 MG PO CAPS
100.0000 mg | ORAL_CAPSULE | Freq: Three times a day (TID) | ORAL | Status: AC
  Administered 2017-06-21 – 2017-06-22 (×3): 100 mg via ORAL
  Filled 2017-06-21 (×3): qty 1

## 2017-06-21 MED ORDER — LOSARTAN POTASSIUM 50 MG PO TABS
100.0000 mg | ORAL_TABLET | Freq: Every day | ORAL | Status: DC
  Administered 2017-06-21: 100 mg via ORAL
  Filled 2017-06-21: qty 2

## 2017-06-21 MED ORDER — ASPIRIN EC 81 MG PO TBEC
81.0000 mg | DELAYED_RELEASE_TABLET | Freq: Every day | ORAL | Status: DC
  Administered 2017-06-21: 81 mg via ORAL
  Filled 2017-06-21: qty 1

## 2017-06-21 MED ORDER — ACETAMINOPHEN 650 MG RE SUPP: 650.0000 mg | Freq: Four times a day (QID) | RECTAL | Status: DC | PRN

## 2017-06-21 MED ORDER — FLUTICASONE FUROATE-VILANTEROL 100-25 MCG/INH IN AEPB
1.0000 | INHALATION_SPRAY | Freq: Every day | RESPIRATORY_TRACT | Status: DC
  Administered 2017-06-21 – 2017-06-26 (×6): 1 via RESPIRATORY_TRACT
  Filled 2017-06-21: qty 28

## 2017-06-21 MED ORDER — CEFDINIR 300 MG PO CAPS
300.0000 mg | ORAL_CAPSULE | ORAL | Status: DC
  Administered 2017-06-21 – 2017-06-23 (×2): 300 mg via ORAL
  Filled 2017-06-21 (×4): qty 1

## 2017-06-21 MED ORDER — MAGNESIUM SULFATE 4 GM/100ML IV SOLN
4.0000 g | Freq: Once | INTRAVENOUS | Status: AC
  Administered 2017-06-21: 17:00:00 4 g via INTRAVENOUS
  Filled 2017-06-21: qty 100

## 2017-06-21 MED ORDER — SODIUM CHLORIDE 0.9 % IV SOLN
INTRAVENOUS | Status: DC
  Administered 2017-06-21 – 2017-06-23 (×4): via INTRAVENOUS

## 2017-06-21 MED ORDER — METHYLPREDNISOLONE SODIUM SUCC 125 MG IJ SOLR
60.0000 mg | Freq: Three times a day (TID) | INTRAMUSCULAR | Status: DC
  Administered 2017-06-21 – 2017-06-22 (×5): 60 mg via INTRAVENOUS
  Filled 2017-06-21 (×5): qty 2

## 2017-06-21 MED ORDER — POTASSIUM CHLORIDE CRYS ER 20 MEQ PO TBCR
20.0000 meq | EXTENDED_RELEASE_TABLET | Freq: Once | ORAL | Status: AC
  Administered 2017-06-21: 15:00:00 20 meq via ORAL
  Filled 2017-06-21: qty 1

## 2017-06-21 MED ORDER — AMLODIPINE BESYLATE 10 MG PO TABS
10.0000 mg | ORAL_TABLET | Freq: Every day | ORAL | Status: DC
  Administered 2017-06-21 – 2017-06-26 (×6): 10 mg via ORAL
  Filled 2017-06-21: qty 2
  Filled 2017-06-21 (×4): qty 1
  Filled 2017-06-21: qty 2

## 2017-06-21 MED ORDER — ONDANSETRON HCL 4 MG/2ML IJ SOLN
4.0000 mg | Freq: Four times a day (QID) | INTRAMUSCULAR | Status: DC | PRN
Start: 2017-06-21 — End: 2017-06-26

## 2017-06-21 MED ORDER — ACETAMINOPHEN 325 MG PO TABS: 650.0000 mg | ORAL_TABLET | Freq: Four times a day (QID) | ORAL | Status: DC | PRN

## 2017-06-21 MED ORDER — PANTOPRAZOLE SODIUM 40 MG PO PACK
20.0000 mg | PACK | Freq: Two times a day (BID) | ORAL | Status: DC
  Administered 2017-06-21 – 2017-06-26 (×11): 20 mg via ORAL
  Filled 2017-06-21 (×12): qty 20

## 2017-06-21 MED ORDER — OXYCODONE HCL 5 MG PO TABS
5.0000 mg | ORAL_TABLET | ORAL | Status: DC | PRN
  Administered 2017-06-21 – 2017-06-25 (×4): 5 mg via ORAL
  Filled 2017-06-21 (×4): qty 1

## 2017-06-21 MED ORDER — ATORVASTATIN CALCIUM 20 MG PO TABS
40.0000 mg | ORAL_TABLET | Freq: Every day | ORAL | Status: DC
  Administered 2017-06-21 – 2017-06-25 (×5): 40 mg via ORAL
  Filled 2017-06-21 (×5): qty 2

## 2017-06-21 MED ORDER — ISOSORBIDE MONONITRATE ER 30 MG PO TB24
60.0000 mg | ORAL_TABLET | Freq: Every day | ORAL | Status: DC
  Administered 2017-06-21 – 2017-06-26 (×6): 60 mg via ORAL
  Filled 2017-06-21: qty 1
  Filled 2017-06-21 (×4): qty 2
  Filled 2017-06-21: qty 1

## 2017-06-21 MED ORDER — ONDANSETRON HCL 4 MG PO TABS: 4.0000 mg | ORAL_TABLET | Freq: Four times a day (QID) | ORAL | Status: DC | PRN

## 2017-06-21 NOTE — Progress Notes (Addendum)
[place] [facility] NAME: Billy Ortiz    MR#:  [id number]  DATE OF BIRTH:  1940-09-28  SUBJECTIVE:   Patient presented to the hospital due to chest pain with hemoptysis. No further hemoptysis overnight.  Hg. Stable.  CP has improved. Mildly elevated Trop.  No other complaints presently.   REVIEW OF SYSTEMS:    Review of Systems  Constitutional: Negative for chills and fever.  HENT: Negative for congestion and tinnitus.   Eyes: Negative for blurred vision and double vision.  Respiratory: Positive for hemoptysis. Negative for cough, shortness of breath and wheezing.   Cardiovascular: Positive for chest pain. Negative for orthopnea and PND.  Gastrointestinal: Negative for abdominal pain, diarrhea, nausea and vomiting.  Genitourinary: Negative for dysuria and hematuria.  Neurological: Negative for dizziness, sensory change and focal weakness.  All other systems reviewed and are negative.   Nutrition: Heart healthy Tolerating Diet: Yes Tolerating PT: Await Eval.   DRUG ALLERGIES:   Allergies  Allergen Reactions  . Tramadol Hives  . Lisinopril     Other reaction(s): Unknown Other reaction(s): Unknown    VITALS:  Blood pressure (!) 164/82, pulse 68, temperature 98.7 F (37.1 C), temperature source Oral, resp. rate 17, height 5\' 11"  (1.803 m), weight 79.7 kg (175 lb 12.8 oz), SpO2 100 %.  PHYSICAL EXAMINATION:   Physical Exam  GENERAL:  77 y.o.-year-old patient lying in bed in no acute distress.  EYES: Pupils equal, round, reactive to light and accommodation. No scleral icterus. Extraocular muscles intact.  HEENT: Head atraumatic, normocephalic. Oropharynx and nasopharynx clear.  NECK:  Supple, no jugular venous distention. No thyroid enlargement, no tenderness.  LUNGS: Normal breath sounds bilaterally, no wheezing, rales, rhonchi. No use of accessory muscles of respiration.  CARDIOVASCULAR: S1, S2 normal. No murmurs, rubs, or  gallops.  ABDOMEN: Soft, nontender, nondistended. Bowel sounds present. No organomegaly or mass.  EXTREMITIES: No cyanosis, clubbing or edema b/l.    NEUROLOGIC: Cranial nerves II through XII are intact. No focal Motor or sensory deficits b/l.   PSYCHIATRIC: The patient is alert and oriented x 3.  SKIN: No obvious rash, lesion, or ulcer.    LABORATORY PANEL:   CBC Recent Labs  Lab 06/21/17 0054  WBC 13.2*  HGB 10.6*  HCT 32.0*  PLT 277   ------------------------------------------------------------------------------------------------------------------  Chemistries  Recent Labs  Lab 06/21/17 0054  NA 134*  K 3.4*  CL 104  CO2 24  GLUCOSE 113*  BUN 26*  CREATININE 2.91*  CALCIUM 8.0*  AST 15  15  ALT 8*  9*  ALKPHOS 97  94  BILITOT 0.9  0.9   ------------------------------------------------------------------------------------------------------------------  Cardiac Enzymes Recent Labs  Lab 06/21/17 0706  TROPONINI 0.25*   ------------------------------------------------------------------------------------------------------------------  RADIOLOGY:  Dg Chest 2 View  Result Date: 06/20/2017 CLINICAL DATA:  Lung cancer, recent biopsy.  Weakness.  Hypotensive. EXAM: CHEST - 2 VIEW COMPARISON:  Prior CT 05/04/2017.  PET scan 05/10/2017. FINDINGS: Cardiomegaly. Median sternotomy for CABG. Mild vascular congestion. No consolidation or edema. LEFT perihilar lung lesion better seen on CT. No acute osseous findings. There may be slight worsening aeration compared with priors due to increasing vascular congestion. The IMPRESSION: Cardiomegaly. No consolidation or edema. There may be slight worsening vascular congestion. Electronically Signed   By: Staci Righter M.D.   On: 06/20/2017 18:03     ASSESSMENT AND PLAN:   77 yo male w/ hx of Lung Cancer s/p recent Bronch with biopsy, HTN,  GERD, hyperlipidemia who presents to the hospital due to chest pain and hemoptysis.   Next  1.  Hemoptysis- patient has a history of lung cancer and is status post recent bronchoscopy done earlier this month. -Hemoglobin is stable and patient is not hypoxic.  Seen by pulmonary, no plans for further bronchoscopy.  Hold aspirin, continue IV steroids, will place on Oral Omnicef, Tessalon pearls PRN for cough.  - follow clinically.   2.  Chest pain-presently resolved.  Patient had a mildly elevated troponin.  This is secondary to supply demand ischemia.  Patient's chest pain is likely musculoskeletal in nature. -Await echocardiogram results.  Continue telemetry. -Continue carvedilol, Imdur, losartan  3.  Hypokalemia-we will place on oral potassium supplements and repeat level in the morning.  Check magnesium level.  4.  Hyperlipidemia-continue atorvastatin.  5.  Lung cancer-status post recent bronchoscopy.  Patient was supposed to get a Port-A-Cath placed yesterday.  Discussed with vascular surgery who plans on doing Port-A-cath tomorrow.   6. Acute renal Failure - etiology unclear.  Prerenal in nature, continue to hold torsemide for now.  We will start on some gentle IV fluids, follow BUN and creatinine.  All the records are reviewed and case discussed with Care Management/Social Worker. Management plans discussed with the patient, family and they are in agreement.  CODE STATUS: Full code  DVT Prophylaxis: Ted's & SCD's.   TOTAL TIME TAKING CARE OF THIS PATIENT: 30 minutes.   POSSIBLE D/C IN 1-2 DAYS, DEPENDING ON CLINICAL CONDITION.   Henreitta Leber M.D on 06/21/2017 at 1:34 PM  Between 7am to 6pm - Pager - [phone]  After 6pm go to [email or web address] - [employer] [facility]  Office  [phone]  CC: Primary care physician; [employer]

## 2017-06-21 NOTE — Progress Notes (Signed)
Pharmacist - Prescriber Communication  Cefdinir dose modified from 300 mg po BID to 300 mg po daily due to CrCl less than 30 mL/min.  Billy Ortiz A. [place], [place].D., BCPS Clinical Pharmacist 06/21/17 13:50

## 2017-06-21 NOTE — Progress Notes (Signed)
Date: 06/21/2017,   MRN# [id number] EVERTON BERTHA [birth year] Code Status:     Code Status Orders  (From admission, onward)        Start     Ordered   06/21/17 0034  Full code  Continuous     06/21/17 0033    Code Status History    Date Active Date Inactive Code Status Order ID Comments User Context   05/26/2016 1604 05/26/2016 2031 Full Code [id number]  Corey Skains, MD Inpatient    Advance Directive Documentation     Most Recent Value  Type of Advance Directive  Healthcare Power of Attorney, Living will  Pre-existing out of facility DNR order (yellow form or pink MOST form)  -  "MOST" Form in Place?  -     Hosp day:@LENGTHOFSTAYDAYS @ Referring MD: @ATDPROV @     PCP:      AdmissionWeight: 80.3 kg (177 lb)                 CurrentWeight: 79.7 kg (175 lb 12.8 oz)   CC:  Hemoptysis   HPI: This is a 24 year yr old male. 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 S/p ebus 2 weeks or so ago. Vague chest discomfort, non pleuritic, not c/w angina.   PMHX:   Past Medical History:  Diagnosis Date  . Cancer [facility])    prostate  . Cataract   . Elevated lipids   . GERD (gastroesophageal reflux disease)   . HOH (hard of hearing)   . Hypertension   . Pneumonia   . Pulmonary nodules/lesions, multiple   . Shortness of breath dyspnea    Surgical Hx:  Past Surgical History:  Procedure Laterality Date  . BACK SURGERY     discectomy ,reports right foot drop since surgery   . CARDIAC CATHETERIZATION     2 stents  . CATARACT EXTRACTION W/PHACO Left 04/22/2015   Procedure: CATARACT EXTRACTION PHACO AND INTRAOCULAR LENS PLACEMENT (IOC);  Surgeon: Birder Robson, MD;  Location: [facility] ORS;  Service: Ophthalmology;  Laterality: Left;  [place]     1:17.8 AP%   25.7 CDE    20.03 fluid pack lot # [id number]  . COLONOSCOPY  2009  . COLONOSCOPY WITH PROPOFOL N/A 03/24/2016   Procedure: COLONOSCOPY WITH PROPOFOL;  Surgeon: Christene Lye, MD;  Location: [facility] ENDOS[facility];  Service: Endoscopy;  Laterality: N/A;  . CORONARY ARTERY BYPASS GRAFT     single  . ENDOBRONCHIAL ULTRASOUND N/A 06/03/2017   Procedure: ENDOBRONCHIAL ULTRASOUND;  Surgeon: [name], MD;  Location: [facility] ORS;  Service: Pulmonary;  Laterality: N/A;  . ESOPHAGOGASTRODUODENOSCOPY (EGD) WITH PROPOFOL N/A 03/24/2016   Procedure: ESOPHAGOGASTRODUODENOSCOPY (EGD) WITH PROPOFOL;  Surgeon: Christene Lye, MD;  Location: [facility] ENDOS[facility];  Service: Endoscopy;  Laterality: N/A;  . EYE SURGERY     bilateral  . LEFT HEART CATH AND CORONARY ANGIOGRAPHY Left 05/26/2016   Procedure: Left Heart Cath and Coronary Angiography;  Surgeon: Corey Skains, MD;  Location: [place] [facility];  Service: Cardiovascular;  Laterality: Left;   Family Hx:  Family History  Problem Relation Age of Onset  . Pancreatic cancer Brother   . Pancreatic cancer Paternal Aunt    Social Hx:   Social History   Tobacco Use  . Smoking status: Current Every Day Smoker    Packs/day: 1.00    Years: 60.00    Pack years: 60.00    Types: Cigarettes  .  Smokeless tobacco: Never Used  Substance Use Topics  . Alcohol use: No  . Drug use: No   Medication:    Home Medication:    Current Medication: @CURMEDTAB @   Allergies:  Tramadol and Lisinopril  Review of Systems: Gen:  Denies  fever, sweats, chills HEENT: Denies blurred vision, double vision, ear pain, eye pain, hearing loss, nose bleeds, sore throat Cvc:  No dizziness, chest pain or heaviness Resp:  + cough, + hemoptysis. Rare wheezing Gi: Denies swallowing difficulty, stomach pain, nausea or vomiting, diarrhea, constipation, bowel incontinence Gu:  Denies bladder incontinence, burning urine Ext:   No Joint pain, stiffness or swelling Skin: No skin rash, easy bruising or bleeding or hives Endoc:  No polyuria, polydipsia , polyphagia or weight change Psych: No depression, insomnia or hallucinations  Other:  All  other systems negative  Physical Examination:   VS: BP (!) 164/82 (BP Location: Left Arm)   Pulse 68   Temp 98.7 F (37.1 C) (Oral)   Resp 17   Ht 5\' 11"  (1.803 m)   Wt 79.7 kg (175 lb 12.8 oz)   SpO2 100%   BMI 24.52 kg/m   General Appearance: No distress, + cough, red blood in tray/   Neuro: without focal findings, mental status, speech normal, alert and oriented, cranial nerves 2-12 intact, reflexes normal and symmetric, sensation grossly normal  HEENT: PERRLA, EOM intact, no ptosis, no other lesions noticed, NECK: Supple, no stridor Pulmonary:.No wheezing, No rales  + ve sputum Production:   Cardiovascular:  Normal S1,S2.  No m/r/g.  Abdominal aorta pulsation normal.    Abdomen:Benign, Soft, non-tender, No masses, hepatosplenomegaly, No lymphadenopathy Endoc: No evident thyromegaly, no signs of acromegaly or Cushing features Skin:   warm, no rashes, no ecchymosis  Extremities: normal, no cyanosis, clubbing, no edema, warm with normal capillary refill. :   Labs results:   Recent Labs    06/20/17 1757 06/21/17 0054  HGB 11.1* 10.6*  HCT 33.2* 32.0*  MCV 71.9* 71.7*  WBC 14.1* 13.2*  BUN 25* 26*  CREATININE 2.70* 2.91*  GLUCOSE 80 113*  CALCIUM 8.1* 8.0*  INR  --  1.14  ,  CR 2.91    Rad results:   Dg Chest 2 View  Result Date: 06/20/2017 CLINICAL DATA:  Lung cancer, recent biopsy.  Weakness.  Hypotensive. EXAM: CHEST - 2 VIEW COMPARISON:  Prior CT 05/04/2017.  PET scan 05/10/2017. FINDINGS: Cardiomegaly. Median sternotomy for CABG. Mild vascular congestion. No consolidation or edema. LEFT perihilar lung lesion better seen on CT. No acute osseous findings. There may be slight worsening aeration compared with priors due to increasing vascular congestion. The IMPRESSION: Cardiomegaly. No consolidation or edema. There may be slight worsening vascular congestion. Electronically Signed   By: Staci Righter M.D.   On: 06/20/2017 18:03    Assessment and Plan: Here with  non massive hemoptysis, worse. several days after the bronchoscopy. Known hx of squamous cell cancer. On aspirin. He does have a cough as well. ? It is from bronchitis, no cancer. At present no strong hx of pe but he is at risk.   -solumedrol 50 mg iv tid -ceftin 500 mg q 12 hours -tessalon perles 100 mg q tid prn -hold asa -hold on bronch for now -following closely -spoke with oncology ( no chemo until d/c)  Renal insufficiency, with hemoptysis ? pulm - renal syndrome, doubt -hydration -avoid nephrotoxins -check anca,  -supplement k  Copd -continue same copd regimen    I  have personally obtained a history, examined the patient, evaluated laboratory and imaging results, formulated the assessment and plan and placed orders.  The Patient requires high complexity decision making for assessment and support, frequent evaluation and titration of therapies, application of advanced monitoring technologies and extensive interpretation of multiple databases.   Tasean Mancha,M.D. Pulmonary  Medicine [facility]

## 2017-06-21 NOTE — Progress Notes (Signed)
*  PRELIMINARY RESULTS* Echocardiogram 2D Echocardiogram has been performed.  Billy Ortiz 06/21/2017, 9:15 AM

## 2017-06-22 ENCOUNTER — Inpatient Hospital Stay: Payer: Medicare Other

## 2017-06-22 ENCOUNTER — Ambulatory Visit: Admit: 2017-06-22 | Payer: Medicare Other | Admitting: Vascular Surgery

## 2017-06-22 ENCOUNTER — Encounter
Admission: EM | Disposition: A | Discharge status: Home / Self Care | Payer: Self-pay | Source: Home / Self Care | Attending: Specialist

## 2017-06-22 ENCOUNTER — Encounter: Payer: Self-pay | Admitting: Vascular Surgery

## 2017-06-22 DIAGNOSIS — C3490 Malignant neoplasm of unspecified part of unspecified bronchus or lung: Secondary | ICD-10-CM

## 2017-06-22 HISTORY — PX: PORTA CATH INSERTION: CATH118285

## 2017-06-22 LAB — BASIC METABOLIC PANEL
ANION GAP: 7 (ref 5–15)
BUN: 36 mg/dL — ABNORMAL HIGH (ref 6–20)
CALCIUM: 8.2 mg/dL — AB (ref 8.9–10.3)
CHLORIDE: 108 mmol/L (ref 101–111)
CO2: 21 mmol/L — AB (ref 22–32)
Creatinine, Ser: 3.1 mg/dL — ABNORMAL HIGH (ref 0.61–1.24)
GFR calc non Af Amer: 18 mL/min — ABNORMAL LOW (ref >60)
GFR, EST AFRICAN AMERICAN: 21 mL/min — AB (ref >60)
Glucose, Bld: 141 mg/dL — ABNORMAL HIGH (ref 65–99)
Potassium: 4.2 mmol/L (ref 3.5–5.1)
Sodium: 136 mmol/L (ref 135–145)

## 2017-06-22 LAB — ANCA TITERS
Atypical P-ANCA titer: <1:20 {titer}
C-ANCA: <1:20 {titer}

## 2017-06-22 LAB — MAGNESIUM
Magnesium: 2.1 mg/dL (ref 1.7–2.4)
Magnesium: 2.2 mg/dL (ref 1.7–2.4)

## 2017-06-22 SURGERY — PORTA CATH INSERTION
Anesthesia: Moderate Sedation

## 2017-06-22 MED ORDER — FENTANYL CITRATE (PF) 100 MCG/2ML IJ SOLN
INTRAMUSCULAR | Status: DC | PRN
  Administered 2017-06-22: 50 ug via INTRAVENOUS
  Administered 2017-06-22: 25 ug via INTRAVENOUS

## 2017-06-22 MED ORDER — MIDAZOLAM HCL 2 MG/2ML IJ SOLN
INTRAMUSCULAR | Status: DC | PRN
  Administered 2017-06-22: 1 mg via INTRAVENOUS
  Administered 2017-06-22: 2 mg via INTRAVENOUS

## 2017-06-22 MED ORDER — FENTANYL CITRATE (PF) 100 MCG/2ML IJ SOLN
INTRAMUSCULAR | Status: AC
  Filled 2017-06-22: qty 2

## 2017-06-22 MED ORDER — MIDAZOLAM HCL 5 MG/5ML IJ SOLN
INTRAMUSCULAR | Status: AC
  Filled 2017-06-22: qty 5

## 2017-06-22 MED ORDER — LIDOCAINE-EPINEPHRINE (PF) 1 %-1:200000 IJ SOLN
INTRAMUSCULAR | Status: AC
  Filled 2017-06-22: qty 30

## 2017-06-22 MED ORDER — CEFAZOLIN SODIUM-DEXTROSE 2-4 GM/100ML-% IV SOLN
2.0000 g | Freq: Once | INTRAVENOUS | Status: AC
  Administered 2017-06-22: 2 g via INTRAVENOUS

## 2017-06-22 MED ORDER — CEFAZOLIN SODIUM-DEXTROSE 2-4 GM/100ML-% IV SOLN
INTRAVENOUS | Status: AC
  Administered 2017-06-22: 2 g via INTRAVENOUS
  Filled 2017-06-22: qty 100

## 2017-06-22 MED ORDER — SODIUM CHLORIDE 0.9 % IV SOLN
Freq: Once | INTRAVENOUS | Status: AC
  Administered 2017-06-22: 14:00:00
  Filled 2017-06-22: qty 80

## 2017-06-22 MED ORDER — HEPARIN (PORCINE) IN NACL 1000-0.9 UT/500ML-% IV SOLN
INTRAVENOUS | Status: AC
  Filled 2017-06-22: qty 500

## 2017-06-22 MED ORDER — SODIUM CHLORIDE 0.9 % IV SOLN: INTRAVENOUS | Status: DC

## 2017-06-22 SURGICAL SUPPLY — 12 items
CANNULA 5F STIFF (CANNULA) ×3 IMPLANT
COVER PROBE U/S 5X48 (MISCELLANEOUS) ×3 IMPLANT
DERMABOND ADVANCED (GAUZE/BANDAGES/DRESSINGS) ×2
DERMABOND ADVANCED .7 DNX12 (GAUZE/BANDAGES/DRESSINGS) ×1 IMPLANT
KIT PORT POWER 8FR ISP CVUE (Port) ×3 IMPLANT
PACK ANGIOGRAPHY (CUSTOM PROCEDURE TRAY) ×3 IMPLANT
PENCIL ELECTRO HAND CTR (MISCELLANEOUS) ×3 IMPLANT
SUT MNCRL AB 4-0 PS2 18 (SUTURE) ×3 IMPLANT
SUT PROLENE 0 CT 1 30 (SUTURE) ×3 IMPLANT
SUT VIC AB 3-0 SH 27 (SUTURE) ×2
SUT VIC AB 3-0 SH 27X BRD (SUTURE) ×1 IMPLANT
TOWEL OR 17X26 4PK STRL BLUE (TOWEL DISPOSABLE) ×3 IMPLANT

## 2017-06-22 NOTE — Progress Notes (Signed)
Date: 06/22/2017,   MRN# [id number] Billy Ortiz 07-02-1940 Code Status:     Code Status Orders  (From admission, onward)        Start     Ordered   06/21/17 0034  Full code  Continuous     06/21/17 0033    Code Status History    Date Active Date Inactive Code Status Order ID Comments User Context   05/26/2016 1604 05/26/2016 2031 Full Code [id number]  Corey Skains, MD Inpatient    Advance Directive Documentation     Most Recent Value  Type of Advance Directive  Healthcare Power of Attorney, Living will  Pre-existing out of facility DNR order (yellow form or pink MOST form)  -  "MOST" Form in Place?  -          HPI: Hemoptysis is much improved, dyspnea no worse.    PMHX:   Past Medical History:  Diagnosis Date  . Cancer [facility])    prostate  . Cataract   . Elevated lipids   . GERD (gastroesophageal reflux disease)   . HOH (hard of hearing)   . Hypertension   . Pneumonia   . Pulmonary nodules/lesions, multiple   . Shortness of breath dyspnea    Surgical Hx:  Past Surgical History:  Procedure Laterality Date  . BACK SURGERY     discectomy ,reports right foot drop since surgery   . CARDIAC CATHETERIZATION     2 stents  . CATARACT EXTRACTION W/PHACO Left 04/22/2015   Procedure: CATARACT EXTRACTION PHACO AND INTRAOCULAR LENS PLACEMENT (IOC);  Surgeon: Birder Robson, MD;  Location: [facility] ORS;  Service: Ophthalmology;  Laterality: Left;  [place]     1:17.8 AP%   25.7 CDE    20.03 fluid pack lot # [id number]  . COLONOSCOPY  2009  . COLONOSCOPY WITH PROPOFOL N/A 03/24/2016   Procedure: COLONOSCOPY WITH PROPOFOL;  Surgeon: Christene Lye, MD;  Location: [facility] END[facility];  Service: Endoscopy;  Laterality: N/A;  . CORONARY ARTERY BYPASS GRAFT     single  . ENDOBRONCHIAL ULTRASOUND N/A 06/03/2017   Procedure: ENDOBRONCHIAL ULTRASOUND;  Surgeon: [name], MD;  Location: [facility] ORS;  Service: Pulmonary;  Laterality: N/A;  . ESOPHAGOGASTRODUODENOSCOPY (EGD) WITH  PROPOFOL N/A 03/24/2016   Procedure: ESOPHAGOGASTRODUODENOSCOPY (EGD) WITH PROPOFOL;  Surgeon: Christene Lye, MD;  Location: [facility] END[facility];  Service: Endoscopy;  Laterality: N/A;  . EYE SURGERY     bilateral  . LEFT HEART CATH AND CORONARY ANGIOGRAPHY Left 05/26/2016   Procedure: Left Heart Cath and Coronary Angiography;  Surgeon: Corey Skains, MD;  Location: [place] [facility] LAB;  Service: Cardiovascular;  Laterality: Left;   Family Hx:  Family History  Problem Relation Age of Onset  . Pancreatic cancer Brother   . Pancreatic cancer Paternal Aunt    Social Hx:   Social History   Tobacco Use  . Smoking status: Current Every Day Smoker    Packs/day: 1.00    Years: 60.00    Pack years: 60.00    Types: Cigarettes  . Smokeless tobacco: Never Used  Substance Use Topics  . Alcohol use: No  . Drug use: No   Medication:    Home Medication:    Current Medication: @CURMEDTAB @   Allergies:  Tramadol and Lisinopril  Review of Systems: Gen:  Denies  fever, sweats, chills HEENT: Denies blurred vision, double vision, ear pain, eye pain, hearing loss, nose bleeds, sore throat Cvc:  No dizziness, chest pain or heaviness Resp: no  wheezing, scant blood   Gi: Denies swallowing difficulty, stomach pain, nausea or vomiting, diarrhea, constipation, bowel incontinence Gu:  Denies bladder incontinence, burning urine Ext:   No Joint pain, stiffness or swelling Skin: No skin rash, easy bruising or bleeding or hives Endoc:  No polyuria, polydipsia , polyphagia or weight change Psych: No depression, insomnia or hallucinations  Other:  All other systems negative  Physical Examination:   VS: BP (!) 155/74 (BP Location: Left Arm)   Pulse (!) 47   Temp 97.8 F (36.6 C) (Oral)   Resp 15   Ht 5\' 11"  (1.803 m)   Wt 78.4 kg (172 lb 12.8 oz)   SpO2 93%   BMI 24.10 kg/m   General Appearance: No distress  Neuro: without focal findings, mental status, speech normal, alert and  oriented, cranial nerves 2-12 intact, reflexes normal and symmetric, sensation grossly normal  HEENT: PERRLA, EOM intact, no ptosis, no other lesions noticed,  NECK: Supple, no jvd Pulmonary:.No wheezing, No rales   Cardiovascular:  Normal S1,S2.  No m/r/g.  .    Abdomen:Benign, Soft, non-tender, No masses, hepatosplenomegaly, No lymphadenopathy Endoc: No evident thyromegaly, no signs of acromegaly or Cushing features Skin:   warm, no rashes, no ecchymosis  Extremities: normal, no cyanosis, clubbing, no edema, warm with normal capillary refill. Other findings:   Labs results:   Recent Labs    06/20/17 1757 06/21/17 0054 06/22/17 0607  HGB 11.1* 10.6*  --   HCT 33.2* 32.0*  --   MCV 71.9* 71.7*  --   WBC 14.1* 13.2*  --   BUN 25* 26* 36*  CREATININE 2.70* 2.91* 3.10*  GLUCOSE 80 113* 141*  CALCIUM 8.1* 8.0* 8.2*  INR  --  1.14  --   ,      Rad results:   US Renal  Result Date: 06/22/2017 CLINICAL DATA:  Acute renal failure EXAM: RENAL / URINARY TRACT ULTRASOUND COMPLETE COMPARISON:  Renal ultrasound 04/05/2014 FINDINGS: Right Kidney: Length: 10.5 cm. Large 11 mm midpole cyst. 4 x 5 cm lower pole cyst. Echogenicity within normal limits. No mass or hydronephrosis visualized. Left Kidney: Length: 10.2 cm. 3 cm lower pole cyst. 15 mm lower pole cyst. 12 mm lower pole cyst. Echogenicity within normal limits. No mass or hydronephrosis visualized. Bladder: Empty urinary bladder IMPRESSION: Bilateral renal cysts with a large cyst on the right. Negative for renal obstruction. Normal renal size. Electronically Signed   By: Franchot Gallo M.D.   On: 06/22/2017 09:57     Assessment and Plan: Here with non massive hemoptysis after the bronchoscopy. Known hx of squamous cell cancer. On aspirin. He does have a cough as well. ? It is from bronchitis, no cancer. At present no strong hx of pe but he is at risk. So far the hemoptysis is much better.   -solumedrol 50 mg iv tid -ceftin 500 mg q  12 hours -tessalon perles 100 mg q tid prn -hold asa -hold on bronch for now -following closely -spoke with oncology ( no chemo until d/c)  Renal insufficiency, with hemoptysis ? pulm - renal syndrome, doubt. Renal u/s noted -hydration -avoid nephrotoxins -anca pending  -supplement k -renal consult  Copd -continue same copd regimen     I have personally obtained a history, examined the patient, evaluated laboratory and imaging results, formulated the assessment and plan and placed orders.  The Patient requires high complexity decision making for assessment and support, frequent evaluation and titration of therapies, application of advanced  monitoring technologies and extensive interpretation of multiple databases.   Lynnda Wiersma,M.D. Pulmonary & Critical care Medicine [facility]

## 2017-06-22 NOTE — Progress Notes (Signed)
[place] at [place] NAME: Billy Ortiz    MR#:  [id number]  DATE OF BIRTH:  11/02/1940  SUBJECTIVE:   No further hemoptysis overnight.  Renal function is a bit worse since yesterday.  Patient is voiding well, renal ultrasound shows no obstruction.  Plan for Port-A-Cath placement today.  REVIEW OF SYSTEMS:    Review of Systems  Constitutional: Negative for chills and fever.  HENT: Negative for congestion and tinnitus.   Eyes: Negative for blurred vision and double vision.  Respiratory: Positive for hemoptysis. Negative for cough, shortness of breath and wheezing.   Cardiovascular: Positive for chest pain. Negative for orthopnea and PND.  Gastrointestinal: Negative for abdominal pain, diarrhea, nausea and vomiting.  Genitourinary: Negative for dysuria and hematuria.  Neurological: Negative for dizziness, sensory change and focal weakness.  All other systems reviewed and are negative.   Nutrition: Heart healthy Tolerating Diet: Yes Tolerating PT: Await Eval.   DRUG ALLERGIES:   Allergies  Allergen Reactions  . Tramadol Hives  . Lisinopril     Other reaction(s): Unknown Other reaction(s): Unknown    VITALS:  Blood pressure 140/68, pulse (!) 49, temperature 98.2 F (36.8 C), temperature source Oral, resp. rate 18, height 5\' 11"  (1.803 m), weight 78.4 kg (172 lb 12.8 oz), SpO2 94 %.  PHYSICAL EXAMINATION:   Physical Exam  GENERAL:  77 y.o.-year-old patient lying in bed in no acute distress.  EYES: Pupils equal, round, reactive to light and accommodation. No scleral icterus. Extraocular muscles intact.  HEENT: Head atraumatic, normocephalic. Oropharynx and nasopharynx clear.  NECK:  Supple, no jugular venous distention. No thyroid enlargement, no tenderness.  LUNGS: Normal breath sounds bilaterally, no wheezing, rales, rhonchi. No use of accessory muscles of respiration.  CARDIOVASCULAR: S1, S2 normal. No murmurs, rubs, or  gallops.  ABDOMEN: Soft, nontender, nondistended. Bowel sounds present. No organomegaly or mass.  EXTREMITIES: No cyanosis, clubbing or edema b/l.    NEUROLOGIC: Cranial nerves II through XII are intact. No focal Motor or sensory deficits b/l.   PSYCHIATRIC: The patient is alert and oriented x 3.  SKIN: No obvious rash, lesion, or ulcer.    LABORATORY PANEL:   CBC Recent Labs  Lab 06/21/17 0054  WBC 13.2*  HGB 10.6*  HCT 32.0*  PLT 277   ------------------------------------------------------------------------------------------------------------------  Chemistries  Recent Labs  Lab 06/21/17 0054  06/22/17 0607  NA 134*  --  136  K 3.4*  --  4.2  CL 104  --  108  CO2 24  --  21*  GLUCOSE 113*  --  141*  BUN 26*  --  36*  CREATININE 2.91*  --  3.10*  CALCIUM 8.0*  --  8.2*  MG  --    < > 2.1  AST 15  15  --   --   ALT 8*  9*  --   --   ALKPHOS 97  94  --   --   BILITOT 0.9  0.9  --   --    < > = values in this interval not displayed.   ------------------------------------------------------------------------------------------------------------------  Cardiac Enzymes Recent Labs  Lab 06/21/17 1303  TROPONINI 0.20*   ------------------------------------------------------------------------------------------------------------------  RADIOLOGY:  Dg Chest 2 View  Result Date: 06/20/2017 CLINICAL DATA:  Lung cancer, recent biopsy.  Weakness.  Hypotensive. EXAM: CHEST - 2 VIEW COMPARISON:  Prior CT 05/04/2017.  PET scan 05/10/2017. FINDINGS: Cardiomegaly. Median sternotomy for CABG. Mild vascular congestion. No consolidation or  edema. LEFT perihilar lung lesion better seen on CT. No acute osseous findings. There may be slight worsening aeration compared with priors due to increasing vascular congestion. The IMPRESSION: Cardiomegaly. No consolidation or edema. There may be slight worsening vascular congestion. Electronically Signed   By: Staci Righter M.D.   On:  06/20/2017 18:03   US Renal  Result Date: 06/22/2017 CLINICAL DATA:  Acute renal failure EXAM: RENAL / URINARY TRACT ULTRASOUND COMPLETE COMPARISON:  Renal ultrasound 04/05/2014 FINDINGS: Right Kidney: Length: 10.5 cm. Large 11 mm midpole cyst. 4 x 5 cm lower pole cyst. Echogenicity within normal limits. No mass or hydronephrosis visualized. Left Kidney: Length: 10.2 cm. 3 cm lower pole cyst. 15 mm lower pole cyst. 12 mm lower pole cyst. Echogenicity within normal limits. No mass or hydronephrosis visualized. Bladder: Empty urinary bladder IMPRESSION: Bilateral renal cysts with a large cyst on the right. Negative for renal obstruction. Normal renal size. Electronically Signed   By: Franchot Gallo M.D.   On: 06/22/2017 09:57     ASSESSMENT AND PLAN:   77 yo male w/ hx of Lung Cancer s/p recent Bronch with biopsy, HTN, GERD, hyperlipidemia who presents to the hospital due to chest pain and hemoptysis.  Next  1.  Hemoptysis- patient has a history of lung cancer and is status post recent bronchoscopy done earlier this month. -Hemoglobin stable and patient is not hypoxic not further bleeding overnight.  Seen by pulmonary, no plans for further bronchoscopy.  Hold aspirin, continue IV steroids, cont. Omnicef, Tessalon pearls PRN for cough. Pt. Improving.  2.  Chest pain-presently resolved.  Patient had a mildly elevated troponin.  This is secondary to supply demand ischemia.  Patient's chest pain is likely musculoskeletal in nature. -Await echocardiogram results.  Continue telemetry. -Continue carvedilol, Imdur, losartan  3.  Hypokalemia/Hypomagnesemia -much improved with supplementation and will continue to monitor.  4.  Hyperlipidemia-continue atorvastatin.  5.  Lung cancer-status post recent bronchoscopy.  And follows with Dr. Tasia Catchings.  To get a Port-A-Cath placement with vascular surgery today.  We will have him follow-up with oncology as an outpatient.  6. Acute renal Failure - etiology unclear. ??  Prerenal in nature, will DC losartan, torsemide.  Continue IV fluids, renal ultrasound negative for hydronephrosis.  If creatinine not improving tomorrow will get nephrology consult.  All the records are reviewed and case discussed with Care Management/Social Worker. Management plans discussed with the patient, family and they are in agreement.  CODE STATUS: Full code  DVT Prophylaxis: Ted's & SCD's.   TOTAL TIME TAKING CARE OF THIS PATIENT: 30 minutes.   POSSIBLE D/C IN 1-2 DAYS, DEPENDING ON CLINICAL CONDITION.   Henreitta Leber M.D on 06/22/2017 at 1:19 PM  Between 7am to 6pm - Pager - [phone]  After 6pm go to [email or web address] - Technical brewer [facility]  Office  [phone]  CC: Primary care physician; [employer], [employer]

## 2017-06-22 NOTE — Op Note (Signed)
      [facility]       Operative Note  Date: 06/22/2017  Preoperative diagnosis:  1. Lung cancer  Postoperative diagnosis:  Same as above  Procedures: #1. Ultrasound guidance for vascular access to the right internal jugular vein. #2. Fluoroscopic guidance for placement of catheter. #3. Placement of CT compatible Port-A-Cath, right internal jugular vein.  Surgeon: Leotis Pain, MD.   Anesthesia: Local with moderate conscious sedation for approximately 30  minutes using 3 mg of Versed and 75 mcg of Fentanyl  Fluoroscopy time: less than 1 minute  Contrast used: 0  Estimated blood loss: 5 cc  Indication for the procedure:  The patient is a 77 y.o.male with lung cancer.  The patient needs a Port-A-Cath for durable venous access, chemotherapy, lab draws, and CT scans. We are asked to place this. Risks and benefits were discussed and informed consent was obtained.  Description of procedure: The patient was brought to the vascular and interventional radiology suite.  Moderate conscious sedation was administered throughout the procedure during a face to face encounter with the patient with my supervision of the RN administering medicines and monitoring the patient's vital signs, pulse oximetry, telemetry and mental status throughout from the start of the procedure until the patient was taken to the recovery room. The right neck chest and shoulder were sterilely prepped and draped, and a sterile surgical field was created. Ultrasound was used to help visualize a patent right internal jugular vein. This was then accessed under direct ultrasound guidance without difficulty with the Seldinger needle and a permanent image was recorded. A J-wire was placed. After skin nick and dilatation, the peel-away sheath was then placed over the wire. I then anesthetized an area under the clavicle approximately 1-2 fingerbreadths. A transverse incision was created and an inferior pocket was  created with electrocautery and blunt dissection. The port was then brought onto the field, placed into the pocket and secured to the chest wall with 2 Prolene sutures. The catheter was connected to the port and tunneled from the subclavicular incision to the access site. Fluoroscopic guidance was then used to cut the catheter to an appropriate length. The catheter was then placed through the peel-away sheath and the peel-away sheath was removed. The catheter tip was parked in excellent location under fluorocoscopic guidance in the cavoatrial junction. The pocket was then irrigated with antibiotic impregnated saline and the wound was closed with a running 3-0 Vicryl and a 4-0 Monocryl. The access incision was closed with a single 4-0 Monocryl. The Huber needle was used to withdraw blood and flush the port with heparinized saline. Dermabond was then placed as a dressing. The patient tolerated the procedure well and was taken to the recovery room in stable condition.   Leotis Pain 06/22/2017 2:39 PM   This note was created with Dragon Medical transcription system. Any errors in dictation are purely unintentional.

## 2017-06-22 NOTE — H&P (Signed)
[facility] History & Physical Update  The patient was interviewed and re-examined.  The patient's previous History and Physical has been reviewed and is unchanged.  There is no change in the plan of care. We plan to proceed with the scheduled procedure.  Leotis Pain, MD  06/22/2017, 1:42 PM

## 2017-06-23 LAB — BASIC METABOLIC PANEL
ANION GAP: 7 (ref 5–15)
BUN: 52 mg/dL — ABNORMAL HIGH (ref 6–20)
CO2: 20 mmol/L — ABNORMAL LOW (ref 22–32)
Calcium: 8.1 mg/dL — ABNORMAL LOW (ref 8.9–10.3)
Chloride: 109 mmol/L (ref 101–111)
Creatinine, Ser: 3.95 mg/dL — ABNORMAL HIGH (ref 0.61–1.24)
GFR calc Af Amer: 16 mL/min — ABNORMAL LOW (ref >60)
GFR, EST NON AFRICAN AMERICAN: 13 mL/min — AB (ref >60)
Glucose, Bld: 167 mg/dL — ABNORMAL HIGH (ref 65–99)
Potassium: 4.8 mmol/L (ref 3.5–5.1)
SODIUM: 136 mmol/L (ref 135–145)

## 2017-06-23 LAB — CBC
HCT: 30 % — ABNORMAL LOW (ref 40.0–52.0)
Hemoglobin: 9.7 g/dL — ABNORMAL LOW (ref 13.0–18.0)
MCH: 23.2 pg — ABNORMAL LOW (ref 26.0–34.0)
MCHC: 32.3 g/dL (ref 32.0–36.0)
MCV: 71.7 fL — ABNORMAL LOW (ref 80.0–100.0)
Platelets: 292 10*3/uL (ref 150–440)
RBC: 4.18 MIL/uL — ABNORMAL LOW (ref 4.40–5.90)
RDW: 15.5 % — ABNORMAL HIGH (ref 11.5–14.5)
WBC: 13.5 10*3/uL — ABNORMAL HIGH (ref 3.8–10.6)

## 2017-06-23 LAB — URINALYSIS, COMPLETE (UACMP) WITH MICROSCOPIC
BACTERIA UA: NONE SEEN
Bilirubin Urine: NEGATIVE
GLUCOSE, UA: 50 mg/dL — AB
KETONES UR: 5 mg/dL — AB
Leukocytes, UA: NEGATIVE
NITRITE: NEGATIVE
PROTEIN: NEGATIVE mg/dL
Specific Gravity, Urine: 1.018 (ref 1.005–1.030)
pH: 5 (ref 5.0–8.0)

## 2017-06-23 LAB — ECHOCARDIOGRAM COMPLETE
HEIGHTINCHES: 71 in
Weight: 2812.8 oz

## 2017-06-23 MED ORDER — CARVEDILOL 3.125 MG PO TABS
3.1250 mg | ORAL_TABLET | Freq: Two times a day (BID) | ORAL | Status: DC
  Administered 2017-06-23: 17:00:00 3.125 mg via ORAL
  Filled 2017-06-23: qty 1

## 2017-06-23 MED ORDER — METHYLPREDNISOLONE SODIUM SUCC 125 MG IJ SOLR
60.0000 mg | Freq: Every day | INTRAMUSCULAR | Status: DC
  Administered 2017-06-23 – 2017-06-24 (×2): 60 mg via INTRAVENOUS
  Filled 2017-06-23 (×2): qty 2

## 2017-06-23 MED ORDER — SODIUM BICARBONATE 8.4 % IV SOLN
INTRAVENOUS | Status: DC
Start: 2017-06-23 — End: 2017-06-25
  Administered 2017-06-23 – 2017-06-24 (×3): via INTRAVENOUS
  Filled 2017-06-23 (×6): qty 150

## 2017-06-23 NOTE — Plan of Care (Signed)
  Problem: Education: Goal: Knowledge of General Education information will improve Outcome: Progressing   Problem: Health Behavior/Discharge Planning: Goal: Ability to manage health-related needs will improve Outcome: Progressing   Problem: Clinical Measurements: Goal: Ability to maintain clinical measurements within normal limits will improve Outcome: Progressing Goal: Respiratory complications will improve Outcome: Progressing   Problem: Pain Managment: Goal: General experience of comfort will improve Outcome: Progressing   Problem: Safety: Goal: Ability to remain free from injury will improve Outcome: Progressing

## 2017-06-23 NOTE — Care Management Important Message (Signed)
Important Message  Patient Details  Name: Billy Ortiz MRN: [id number] Date of Birth: September 15, 1940   Medicare Important Message Given:  Yes    Juliann Pulse A Shabreka Coulon 06/23/2017, 10:37 AM

## 2017-06-23 NOTE — Progress Notes (Signed)
[place] at [place] NAME: Billy Ortiz    MR#:  [id number]  DATE OF BIRTH:  [birth year]  SUBJECTIVE:   Cr. A bit worse today and still having some hemoptysis but stable.  Not hypoxic.  No other complaints.   REVIEW OF SYSTEMS:    Review of Systems  Constitutional: Negative for chills and fever.  HENT: Negative for congestion and tinnitus.   Eyes: Negative for blurred vision and double vision.  Respiratory: Positive for hemoptysis. Negative for cough, shortness of breath and wheezing.   Cardiovascular: Negative for chest pain, orthopnea and PND.  Gastrointestinal: Negative for abdominal pain, diarrhea, nausea and vomiting.  Genitourinary: Negative for dysuria and hematuria.  Neurological: Negative for dizziness, sensory change and focal weakness.  All other systems reviewed and are negative.   Nutrition: Heart healthy Tolerating Diet: Yes Tolerating PT: Await Eval.   DRUG ALLERGIES:   Allergies  Allergen Reactions  . Tramadol Hives  . Lisinopril     Other reaction(s): Unknown Other reaction(s): Unknown    VITALS:  Blood pressure (!) 159/87, pulse 63, temperature 98 F (36.7 C), temperature source Oral, resp. rate 16, height 5\' 11"  (1.803 m), weight 81.1 kg (178 lb 11.2 oz), SpO2 96 %.  PHYSICAL EXAMINATION:   Physical Exam  GENERAL:  77 y.o.-year-old patient lying in bed in no acute distress.  EYES: Pupils equal, round, reactive to light and accommodation. No scleral icterus. Extraocular muscles intact.  HEENT: Head atraumatic, normocephalic. Oropharynx and nasopharynx clear.  NECK:  Supple, no jugular venous distention. No thyroid enlargement, no tenderness.  LUNGS: Normal breath sounds bilaterally, no wheezing, rales, rhonchi. No use of accessory muscles of respiration.  CARDIOVASCULAR: S1, S2 normal. No murmurs, rubs, or gallops.   ABDOMEN: Soft, nontender, nondistended. Bowel sounds present. No organomegaly or mass.   EXTREMITIES: No cyanosis, clubbing or edema b/l.    NEUROLOGIC: Cranial nerves II through XII are intact. No focal Motor or sensory deficits b/l.   PSYCHIATRIC: The patient is alert and oriented x 3.  SKIN: No obvious rash, lesion, or ulcer.   Right Chest wall port-a-cath in place.   LABORATORY PANEL:   CBC Recent Labs  Lab 06/23/17 0313  WBC 13.5*  HGB 9.7*  HCT 30.0*  PLT 292   ------------------------------------------------------------------------------------------------------------------  Chemistries  Recent Labs  Lab 06/21/17 0054  06/22/17 0607 06/23/17 0313  NA 134*  --  136 136  K 3.4*  --  4.2 4.8  CL 104  --  108 109  CO2 24  --  21* 20*  GLUCOSE 113*  --  141* 167*  BUN 26*  --  36* 52*  CREATININE 2.91*  --  3.10* 3.95*  CALCIUM 8.0*  --  8.2* 8.1*  MG  --    < > 2.1  --   AST 15  15  --   --   --   ALT 8*  9*  --   --   --   ALKPHOS 97  94  --   --   --   BILITOT 0.9  0.9  --   --   --    < > = values in this interval not displayed.   ------------------------------------------------------------------------------------------------------------------  Cardiac Enzymes Recent Labs  Lab 06/21/17 1303  TROPONINI 0.20*   ------------------------------------------------------------------------------------------------------------------  RADIOLOGY:  US Renal  Result Date: 06/22/2017 CLINICAL DATA:  Acute renal failure EXAM: RENAL / URINARY TRACT ULTRASOUND COMPLETE COMPARISON:  Renal ultrasound  04/05/2014 FINDINGS: Right Kidney: Length: 10.5 cm. Large 11 mm midpole cyst. 4 x 5 cm lower pole cyst. Echogenicity within normal limits. No mass or hydronephrosis visualized. Left Kidney: Length: 10.2 cm. 3 cm lower pole cyst. 15 mm lower pole cyst. 12 mm lower pole cyst. Echogenicity within normal limits. No mass or hydronephrosis visualized. Bladder: Empty urinary bladder IMPRESSION: Bilateral renal cysts with a large cyst on the right. Negative for renal  obstruction. Normal renal size. Electronically Signed   By: Franchot Gallo M.D.   On: 06/22/2017 09:57     ASSESSMENT AND PLAN:   77 yo male w/ hx of Lung Cancer s/p recent Bronch with biopsy, HTN, GERD, hyperlipidemia who presents to the hospital due to chest pain and hemoptysis.  Next  1.  Hemoptysis- patient has a history of lung cancer and is status post recent bronchoscopy done earlier this month. - Still having some intermittent hemoptysis but Hg. Stable.  Seen by pulmonary, no plans for further bronchoscopy.  Hold aspirin, continue IV steroids and will taper, cont. Omnicef,  Cont.Tessalon pearls PRN for cough.  2.  Chest pain-presently resolved.  Patient had a mildly elevated troponin.  This is secondary to supply demand ischemia. -Await echocardiogram results.  Continue telemetry. -Continue carvedilol, Imdur, losartan  3.  Hypokalemia/Hypomagnesemia -much improved with supplementation and will continue to monitor.  4.  Hyperlipidemia-continue atorvastatin.  5.  Lung cancer-status post recent bronchoscopy.  And follows with Dr. [name]. [name].   - s/p Port-a-cath placement yesterday.  Follow up with Oncology as outpatient.   6. Acute renal Failure - etiology unclear. Not improving with IV fluids for the past 2 days.  - Renal US (-) for hydronephrosis.  Will get Nephro consult and discussed w/ Dr. Juleen China.  - hold Losartan, Torsemide for now.   All the records are reviewed and case discussed with Care Management/Social Worker. Management plans discussed with the patient, family and they are in agreement.  CODE STATUS: Full code  DVT Prophylaxis: Ted's & SCD's.   TOTAL TIME TAKING CARE OF THIS PATIENT: 30 minutes.   POSSIBLE D/C IN 2-3 DAYS, DEPENDING ON CLINICAL CONDITION.   Henreitta Leber M.D on 06/23/2017 at 11:21 AM  Between 7am to 6pm - Pager - [phone]  After 6pm go to [email or web address] - [employer] [facility]  Office   [phone]  CC: Primary care physician; [employer], [employer]

## 2017-06-23 NOTE — Progress Notes (Signed)
Date: 06/23/2017,   MRN# [id number] ASH MCELWAIN 1940-12-21 Code Status:     Code Status Orders  (From admission, onward)        Start     Ordered   06/21/17 0034  Full code  Continuous     06/21/17 0033    Code Status History    Date Active Date Inactive Code Status Order ID Comments User Context   05/26/2016 1604 05/26/2016 2031 Full Code [id number]  Corey Skains, MD Inpatient    Advance Directive Documentation     Most Recent Value  Type of Advance Directive  Healthcare Power of [place], Living will  Pre-existing out of facility DNR order (yellow form or pink MOST form)  -  "MOST" Form in Place?  -        HPI: Less blood, renal w/u in progress  PMHX:   Past Medical History:  Diagnosis Date  . Cancer [facility])    prostate  . Cataract   . Elevated lipids   . GERD (gastroesophageal reflux disease)   . HOH (hard of hearing)   . Hypertension   . Pneumonia   . Pulmonary nodules/lesions, multiple   . Shortness of breath dyspnea    Surgical Hx:  Past Surgical History:  Procedure Laterality Date  . BACK SURGERY     discectomy ,reports right foot drop since surgery   . CARDIAC CATHETERIZATION     2 stents  . CATARACT EXTRACTION W/PHACO Left 04/22/2015   Procedure: CATARACT EXTRACTION PHACO AND INTRAOCULAR LENS PLACEMENT (IOC);  Surgeon: Birder Robson, MD;  Location: [facility] ORS;  Service: Ophthalmology;  Laterality: Left;  [place]     1:17.8 AP%   25.7 CDE    20.03 fluid pack lot # [id number]  . COLONOSCOPY  2009  . COLONOSCOPY WITH PROPOFOL N/A 03/24/2016   Procedure: COLONOSCOPY WITH PROPOFOL;  Surgeon: Christene Lye, MD;  Location: [facility] ENDOSCOPY;  Service: Endoscopy;  Laterality: N/A;  . CORONARY ARTERY BYPASS GRAFT     single  . ENDOBRONCHIAL ULTRASOUND N/A 06/03/2017   Procedure: ENDOBRONCHIAL ULTRASOUND;  Surgeon: [name], MD;  Location: [facility] ORS;  Service: Pulmonary;  Laterality: N/A;  . ESOPHAGOGASTRODUODENOSCOPY (EGD) WITH PROPOFOL N/A  03/24/2016   Procedure: ESOPHAGOGASTRODUODENOSCOPY (EGD) WITH PROPOFOL;  Surgeon: Christene Lye, MD;  Location: [facility] ENDOSCOPY;  Service: Endoscopy;  Laterality: N/A;  . EYE SURGERY     bilateral  . LEFT HEART CATH AND CORONARY ANGIOGRAPHY Left 05/26/2016   Procedure: Left Heart Cath and Coronary Angiography;  Surgeon: Corey Skains, MD;  Location: [facility];  Service: Cardiovascular;  Laterality: Left;  . PORTA CATH INSERTION N/A 06/22/2017   Procedure: PORTA CATH INSERTION;  Surgeon: Algernon Huxley, MD;  Location: [facility] [facility];  Service: Cardiovascular;  Laterality: N/A;   Family Hx:  Family History  Problem Relation Age of Onset  . Pancreatic cancer Brother   . Pancreatic cancer Paternal Aunt    Social Hx:   Social History   Tobacco Use  . Smoking status: Current Every Day Smoker    Packs/day: 1.00    Years: 60.00    Pack years: 60.00    Types: Cigarettes  . Smokeless tobacco: Never Used  Substance Use Topics  . Alcohol use: No  . Drug use: No   Medication:    Home Medication:    Current Medication: @CURMEDTAB @   Allergies:  Tramadol and Lisinopril  Review of Systems: Gen:  Denies  fever, sweats, chills  HEENT: Denies blurred vision, double vision, ear pain, eye pain, hearing loss, nose bleeds, sore throat Cvc:  No dizziness, chest pain or heaviness Resp:    Gi: Denies swallowing difficulty, stomach pain, nausea or vomiting, diarrhea, constipation, bowel incontinence Gu:  Denies bladder incontinence, burning urine Ext:   No Joint pain, stiffness or swelling Skin: No skin rash, easy bruising or bleeding or hives Endoc:  No polyuria, polydipsia , polyphagia or weight change Psych: No depression, insomnia or hallucinations  Other:  All other systems negative  Physical Examination:   VS: BP (!) 169/84 (BP Location: Left Arm)   Pulse (!) 56   Temp 97.8 F (36.6 C) (Oral)   Resp 20   Ht 5\' 11"  (1.803 m)   Wt 81.1 kg (178 lb 11.2 oz)    SpO2 93%   BMI 24.92 kg/m   General Appearance: No distress  Neuro: without focal findings, mental status, speech normal, alert and oriented, cranial nerves 2-12 intact, reflexes normal and symmetric, sensation grossly normal  HEENT: PERRLA, EOM intact, no ptosis, no other lesions noticed,  Neck: no stridor Pulmonary:.No wheezing, No rales   Cardiovascular:  Normal S1,S2.  No m/r/g.  Abdominal aorta pulsation normal.    Abdomen:Benign, Soft, non-tender, No masses, hepatosplenomegaly, No lymphadenopathy Endoc: No evident thyromegaly, no signs of acromegaly or Cushing features Skin:   warm, no rashes, no ecchymosis  Extremities: normal, no cyanosis, clubbing, no edema, warm with normal capillary refill. Other findings:   Labs results:   Recent Labs    06/20/17 1757 06/21/17 0054 06/22/17 0607 06/23/17 0313  HGB 11.1* 10.6*  --  9.7*  HCT 33.2* 32.0*  --  30.0*  MCV 71.9* 71.7*  --  71.7*  WBC 14.1* 13.2*  --  13.5*  BUN 25* 26* 36* 52*  CREATININE 2.70* 2.91* 3.10* 3.95*  GLUCOSE 80 113* 141* 167*  CALCIUM 8.1* 8.0* 8.2* 8.1*  INR  --  1.14  --   --   ,    No results for input(s): PH in the last 72 hours.  Invalid input(s): PCO2, PO2, BASEEXCESS, BASEDEFICITE, TFT  Culture results:     Rad results:   No results found.    EKG:     Other:   Assessment and Plan: Here with non massive hemoptysis after the bronchoscopy. Known hx of squamous cell cancer. On aspirin. He does have a cough as well. ? It is from bronchitis, ?? cancer. At present no strong hx of pe but he is at risk. ??? pulm -renal. So far the hemoptysis is much better.   -transition to po steroids -ceftin 500 mg q 12 hours x 7-10 days -tessalon perles 100 mg q tid prn -hold asa -hold on bronch for now  Renal insufficiency, with hemoptysis ? pulm - renal syndrome,renal following.  -hydration -avoid nephrotoxins -serologies pending  -supplement k  Copd -continue same copd  regimen       I have personally obtained a history, examined the patient, evaluated laboratory and imaging results, formulated the assessment and plan and placed orders.  The Patient requires high complexity decision making for assessment and support, frequent evaluation and titration of therapies, application of advanced monitoring technologies and extensive interpretation of multiple databases.   Andrik Sandt,M.D. Pulmonary & Critical care Medicine [facility]

## 2017-06-23 NOTE — Consult Note (Signed)
[facility]  CONSULT NOTE    Date: 06/23/2017                  Patient Name:  Billy Ortiz  MRN: [id number]  DOB: 26-May-1940  Age / Sex: 77 y.o., male         PCP: [facility], [employer]                 Service Requesting Consult: Dr. Verdell Carmine                 Reason for Consult: Acute renal failure            History of Present Illness: [name] is a 77 y.o. 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 He is to start chemotherapy. Patient came in with cough, hemoptysis, shortness of breath, and chest pain. He was found to have acute renal failure with hypovolemia. He was taking torsemide. Started on IV fluids. Creatinine on admission of 2.7 and has now trended up to 3.95. Despite IV fluids. Baseline creatinine of 1.86, GFR of 39 on 05/04/17.   No contrast exposure. Patient denies use of NSAIDs.   Patient has history of bilateral renal cysts, no changes on recent imaging.   Patient was previously followed by my partner, Dr. Holley Raring for CKD stage III however seems to have been lost to follow up. Was last seen in the office in 2016.   Medications: Outpatient medications: Medications Prior to Admission  Medication Sig Dispense Refill Last Dose  . amLODipine (NORVASC) 10 MG tablet Take 10 mg by mouth daily.   06/20/2017 at Unknown time  . aspirin EC 81 MG tablet Take 81 mg by mouth daily.   06/20/2017 at Unknown time  . atorvastatin (LIPITOR) 40 MG tablet Take 40 mg by mouth daily at 6 PM.    06/19/2017 at Unknown time  . carvedilol (COREG) 3.125 MG tablet Take 2 tablets by mouth 2 (two) times daily.   06/20/2017 at Unknown time  . docusate sodium (COLACE) 100 MG capsule Take 100 mg by mouth  daily.   06/20/2017 at Unknown time  . fluticasone furoate-vilanterol (BREO ELLIPTA) 100-25 MCG/INH AEPB Inhale 1 puff into the lungs daily.   06/20/2017 at Unknown time  . isosorbide mononitrate (IMDUR) 60 MG 24 hr tablet Take 60 mg by mouth daily.   06/20/2017 at Unknown time  . losartan (COZAAR) 100 MG tablet Take 100 mg by mouth daily.    06/20/2017 at Unknown time  . methocarbamol (ROBAXIN) 500 MG tablet Take 500 mg by mouth 2 (two) times daily.   06/20/2017 at Unknown time  . nitroGLYCERIN (NITROSTAT) 0.4 MG SL tablet Place 0.4 mg under the tongue every 5 (five) minutes as needed for chest pain.   prn  . omeprazole (PRILOSEC) 20 MG capsule Take 20 mg by mouth 2 (two) times daily before a meal.    06/20/2017 at Unknown time  . torsemide (DEMADEX) 20 MG tablet Take 20 mg by mouth daily as needed (fluid).    prn  . vitamin B-12 (CYANOCOBALAMIN) 500 MCG tablet Take 1,000 mcg by mouth daily.    06/20/2017 at Unknown time  . polyethylene glycol powder (GLYCOLAX/MIRALAX) powder 255 grams one bottle for colonoscopy prep (Patient not  taking: Reported on 06/20/2017) 255 g 0 Not Taking at Unknown time    Current medications: Current Facility-Administered Medications  Medication Dose Route Frequency Provider Last Rate Last Dose  . 0.9 %  sodium chloride infusion   Intravenous Continuous [place]non Huxley, MD 100 mL/hr at 06/23/17 0308    . acetaminophen (TYLENOL) tablet 650 mg  650 mg Oral Q6H PRN Algernon Huxley, MD       Or  . acetaminophen (TYLENOL) suppository 650 mg  650 mg Rectal Q6H PRN Algernon Huxley, MD      . amLODipine (NORVASC) tablet 10 mg  10 mg Oral Daily Algernon Huxley, MD   10 mg at 06/23/17 0954  . atorvastatin (LIPITOR) tablet 40 mg  40 mg Oral q1800 Algernon Huxley, MD   40 mg at 06/22/17 1850  . carvedilol (COREG) tablet 3.125 mg  3.125 mg Oral BID WC Sainani, Belia Heman, MD      . cefdinir (OMNICEF) capsule 300 mg  300 mg Oral Q24H Algernon Huxley, MD   300 mg at 06/21/17 1509  . fluticasone  furoate-vilanterol (BREO ELLIPTA) 100-25 MCG/INH 1 puff  1 puff Inhalation Daily [place]non Huxley, MD   1 puff at 06/23/17 0956  . hydrALAZINE (APRESOLINE) injection 10 mg  10 mg Intravenous Q4H PRN Algernon Huxley, MD      . isosorbide mononitrate (IMDUR) 24 hr tablet 60 mg  60 mg Oral Daily Algernon Huxley, MD   60 mg at 06/23/17 0954  . methylPREDNISolone sodium succinate (SOLU-MEDROL) 125 mg/2 mL injection 60 mg  60 mg Intravenous Daily Henreitta Leber, MD   60 mg at 06/23/17 0959  . ondansetron (ZOFRAN) tablet 4 mg  4 mg Oral Q6H PRN Algernon Huxley, MD       Or  . ondansetron (ZOFRAN) injection 4 mg  4 mg Intravenous Q6H PRN Algernon Huxley, MD      . oxyCODONE (Oxy IR/ROXICODONE) immediate release tablet 5 mg  5 mg Oral Q4H PRN Algernon Huxley, MD   5 mg at 06/21/17 1523  . pantoprazole sodium (PROTONIX) 40 mg/20 mL oral suspension 20 mg  20 mg Oral BID Algernon Huxley, MD   20 mg at 06/23/17 9292   Facility-Administered Medications Ordered in Other Encounters  Medication Dose Route Frequency Provider Last Rate Last Dose  . ceFAZolin (ANCEF) IVPB 2g/100 mL premix  2 g Intravenous Once Stegmayer, Kimberly A, PA-C          Allergies: Allergies  Allergen Reactions  . Tramadol Hives  . Lisinopril     Other reaction(s): Unknown Other reaction(s): Unknown      Past Medical History: Past Medical History:  Diagnosis Date  . Cancer [facility])    prostate  . Cataract   . Elevated lipids   . GERD (gastroesophageal reflux disease)   . HOH (hard of hearing)   . Hypertension   . Pneumonia   . Pulmonary nodules/lesions, multiple   . Shortness of breath dyspnea      Past Surgical History: Past Surgical History:  Procedure Laterality Date  . BACK SURGERY     discectomy ,reports right foot drop since surgery   . CARDIAC CATHETERIZATION     2 stents  . CATARACT EXTRACTION W/PHACO Left 04/22/2015   Procedure: CATARACT EXTRACTION PHACO AND INTRAOCULAR LENS PLACEMENT (IOC);  Surgeon: Birder Robson, MD;   Location: [facility] ORS;  Service: Ophthalmology;  Laterality: Left;  [place]  1:17.8 AP%   25.7 CDE    20.03 fluid pack lot # [id number]  . COLONOSCOPY  2009  . COLONOSCOPY WITH PROPOFOL N/A 03/24/2016   Procedure: COLONOSCOPY WITH PROPOFOL;  Surgeon: Christene Lye, MD;  Location: [facility] END[facility];  Service: Endoscopy;  Laterality: N/A;  . CORONARY ARTERY BYPASS GRAFT     single  . ENDOBRONCHIAL ULTRASOUND N/A 06/03/2017   Procedure: ENDOBRONCHIAL ULTRASOUND;  Surgeon: [name], MD;  Location: [facility] ORS;  Service: Pulmonary;  Laterality: N/A;  . ESOPHAGOGASTRODUODENOSCOPY (EGD) WITH PROPOFOL N/A 03/24/2016   Procedure: ESOPHAGOGASTRODUODENOSCOPY (EGD) WITH PROPOFOL;  Surgeon: Christene Lye, MD;  Location: [facility] END[facility];  Service: Endoscopy;  Laterality: N/A;  . EYE SURGERY     bilateral  . LEFT HEART CATH AND CORONARY ANGIOGRAPHY Left 05/26/2016   Procedure: Left Heart Cath and Coronary Angiography;  Surgeon: Corey Skains, MD;  Location: [place] [facility];  Service: Cardiovascular;  Laterality: Left;  . PORTA CATH INSERTION N/A 06/22/2017   Procedure: PORTA CATH INSERTION;  Surgeon: [place]non Huxley, MD;  Location: [place] [facility];  Service: Cardiovascular;  Laterality: N/A;     Family History: Family History  Problem Relation Age of Onset  . Pancreatic cancer Brother   . Pancreatic cancer Paternal Aunt      Social History: Social History   Socioeconomic History  . Marital status: Single    Spouse name: Not on file  . Number of children: Not on file  . Years of education: Not on file  . Highest education level: Not on file  Occupational History  . Not on file  Social Needs  . Financial resource strain: Not hard at all  . Food insecurity:    Worry: Never true    Inability: Never true  . Transportation needs:    Medical: No    Non-medical: No  Tobacco Use  . Smoking status: Current Every Day Smoker    Packs/day: 1.00    Years: 60.00    Pack  years: 60.00    Types: Cigarettes  . Smokeless tobacco: Never Used  Substance and Sexual Activity  . Alcohol use: No  . Drug use: No  . Sexual activity: Not on file  Lifestyle  . Physical activity:    Days per week: 0 days    Minutes per session: 0 min  . Stress: Not at all  Relationships  . Social connections:    Talks on phone: Once a week    Gets together: Once a week    Attends religious service: 1 to 4 times per year    Active member of club or organization: No    Attends meetings of clubs or organizations: Never    Relationship status: Never married  . Intimate partner violence:    Fear of current or ex partner: No    Emotionally abused: No    Physically abused: No    Forced sexual activity: No  Other Topics Concern  . Not on file  Social History Narrative   Lives with sister     Review of Systems: Review of Systems  Constitutional: Positive for malaise/fatigue and weight loss. Negative for chills, diaphoresis and fever.  HENT: Negative for congestion, ear discharge, ear pain, hearing loss, nosebleeds, sinus pain, sore throat and tinnitus.   Eyes: Negative.  Negative for blurred vision, double vision, photophobia, pain, discharge and redness.  Respiratory: Positive for cough, hemoptysis, sputum production, shortness of breath and wheezing. Negative for stridor.   Cardiovascular:  Negative.  Negative for chest pain, palpitations, orthopnea, claudication, leg swelling and PND.  Gastrointestinal: Negative.  Negative for abdominal pain, blood in stool, constipation, diarrhea, heartburn, melena, nausea and vomiting.  Genitourinary: Negative.  Negative for dysuria, flank pain, frequency, hematuria and urgency.  Musculoskeletal: Negative.  Negative for back pain, falls, joint pain, myalgias and neck pain.  Skin: Negative for itching and rash.  Neurological: Positive for weakness. Negative for dizziness, tingling, tremors, sensory change, speech change, focal weakness,  seizures, loss of consciousness and headaches.  Endo/Heme/Allergies: Negative.  Negative for environmental allergies and polydipsia. Does not bruise/bleed easily.  Psychiatric/Behavioral: Negative.  Negative for depression, hallucinations, memory loss, substance abuse and suicidal ideas. The patient is not nervous/anxious and does not have insomnia.     Vital Signs: Blood pressure (!) 169/84, pulse (!) 56, temperature 97.8 F (36.6 C), temperature source Oral, resp. rate 20, height _0  (1.803 m), weight 81.1 kg (178 lb 11.2 oz), SpO2 93 %.  Weight trends: Filed Weights   06/22/17 0350 06/22/17 1320 06/23/17 0357  Weight: 78.4 kg (172 lb 12.8 oz) 78 kg (172 lb) 81.1 kg (178 lb 11.2 oz)    Physical Exam: General: NAD, laying in bed  Head: Normocephalic, atraumatic. Dry oral mucosal membranes  Eyes: Anicteric, PERRL  Neck: Supple, trachea midline  Lungs:  Clear to auscultation  Heart: Regular rate and rhythm  Abdomen:  Soft, nontender  Extremities:  no  peripheral edema.  Neurologic: Nonfocal, moving all four extremities  Skin: No lesions        Lab results: Basic Metabolic Panel: Recent Labs  Lab 06/21/17 0054 06/21/17 0055 06/21/17 2331 06/22/17 0607 06/23/17 0313  NA 134*  --   --  136 136  K 3.4*  --   --  4.2 4.8  CL 104  --   --  108 109  CO2 24  --   --  21* 20*  GLUCOSE 113*  --   --  141* 167*  BUN 26*  --   --  36* 52*  CREATININE 2.91*  --   --  3.10* 3.95*  CALCIUM 8.0*  --   --  8.2* 8.1*  MG  --  1.0* 2.2 2.1  --     Liver Function Tests: Recent Labs  Lab 06/21/17 0054  AST 15  15  ALT 8*  9*  ALKPHOS 97  94  BILITOT 0.9  0.9  PROT 6.7  6.9  ALBUMIN 2.4*  2.6*   Recent Labs  Lab 06/21/17 0054  LIPASE 18   No results for input(s): AMMONIA in the last 168 hours.  CBC: Recent Labs  Lab 06/20/17 1757 06/21/17 0054 06/23/17 0313  WBC 14.1* 13.2* 13.5*  HGB 11.1* 10.6* 9.7*  HCT 33.2* 32.0* 30.0*  MCV 71.9* 71.7* 71.7*  PLT  279 277 292    Cardiac Enzymes: Recent Labs  Lab 06/20/17 1757 06/21/17 0054 06/21/17 0706 06/21/17 1303  TROPONINI 0.14* 0.34* 0.25* 0.20*    BNP: Invalid input(s): POCBNP  CBG: No results for input(s): GLUCAP in the last 168 hours.  Microbiology: Results for orders placed or performed during the hospital encounter of 06/03/17  Culture, bal-quantitative     Status: Abnormal   Collection Time: 06/03/17  2:48 PM  Result Value Ref Range Status   Specimen Description   Final    BRONCHIAL ALVEOLAR LAVAGE Performed at [facility], [address] [place], [place] [address]    Special Requests   Final  NONE Performed at [facility], [place], [place] [address]    Gram Stain   Final    RARE WBC PRESENT,BOTH PMN AND MONONUCLEAR RARE SQUAMOUS EPITHELIAL CELLS PRESENT NO ORGANISMS SEEN Performed at [facility], [place] [address], [place], [place] [address]    Culture 2,000 COLONIES/mL STAPHYLOCOCCUS AUREUS (A)  Final   Report Status 06/06/2017 FINAL  Final   Organism ID, Bacteria STAPHYLOCOCCUS AUREUS (A)  Final      Susceptibility   Staphylococcus aureus - MIC*    CIPROFLOXACIN <=0.5 SENSITIVE Sensitive     ERYTHROMYCIN <=0.25 SENSITIVE Sensitive     GENTAMICIN <=0.5 SENSITIVE Sensitive     OXACILLIN 0.5 SENSITIVE Sensitive     TETRACYCLINE <=1 SENSITIVE Sensitive     VANCOMYCIN 1 SENSITIVE Sensitive     TRIMETH/SULFA <=10 SENSITIVE Sensitive     CLINDAMYCIN <=0.25 SENSITIVE Sensitive     RIFAMPIN <=0.5 SENSITIVE Sensitive     Inducible Clindamycin NEGATIVE Sensitive     * 2,000 COLONIES/mL STAPHYLOCOCCUS AUREUS  Acid Fast Smear (AFB)     Status: None   Collection Time: 06/03/17  2:48 PM  Result Value Ref Range Status   AFB Specimen Processing Concentration  Final   Acid Fast Smear Negative  Final    Comment: (NOTE) Performed At: [facility] [address] [place], [place] [id number] Rush Farmer MD  SW:[id number]    Source (AFB) BRONCHIAL ALVEOLAR LAVAGE  Final    Comment: Performed at [facility], [address] [place],  [address]    Coagulation Studies: Recent Labs    06/21/17 0054  LABPROT 14.5  INR 1.14    Urinalysis: Recent Labs    06/23/17 0819  COLORURINE YELLOW*  LABSPEC 1.018  PHURINE 5.0  GLUCOSEU 50*  HGBUR MODERATE*  BILIRUBINUR NEGATIVE  KETONESUR 5*  PROTEINUR NEGATIVE  NITRITE NEGATIVE  LEUKOCYTESUR NEGATIVE      Imaging: US Renal  Result Date: 06/22/2017 CLINICAL DATA:  Acute renal failure EXAM: RENAL / URINARY TRACT ULTRASOUND COMPLETE COMPARISON:  Renal ultrasound 04/05/2014 FINDINGS: Right Kidney: Length: 10.5 cm. Large 11 mm midpole cyst. 4 x 5 cm lower pole cyst. Echogenicity within normal limits. No mass or hydronephrosis visualized. Left Kidney: Length: 10.2 cm. 3 cm lower pole cyst. 15 mm lower pole cyst. 12 mm lower pole cyst. Echogenicity within normal limits. No mass or hydronephrosis visualized. Bladder: Empty urinary bladder IMPRESSION: Bilateral renal cysts with a large cyst on the right. Negative for renal obstruction. Normal renal size. Electronically Signed   By: Franchot Gallo M.D.   On: 06/22/2017 09:57      Assessment & Plan: Mr. MUAAZ BRAU is a 77 y.o. black male with lung cancer, hypertension, GERD, hyperlipidemia, COPD, who was admitted to [facility] on 06/20/2017 for Dehydration [E86.0] Acute renal insufficiency [N28.9] Hemoptysis [R04.2] Diarrhea, unspecified type [R19.7]   1. 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  Chronic kidney disease secondary to hypertension and bilateral renal cysts.  Acute renal failure seems to be prerenal azotemia that has progressed to ATN.  Not very responsive to IV fluids. However does appear hypovolemic on examination.  -  Continue IV fluids: change to sodium bicarbonate. - Continue to hold torsemide and losartan.  - Check serologic work up for pulmonary renal disease  2. Hypertension: elevated. Holding outpatient torsemide and losartan -  Continue amlodipine, carvedilol and isosorbide mononitrate.   LOS: 3 Raniya Golembeski 4/25/201912:52 PM

## 2017-06-24 LAB — BASIC METABOLIC PANEL
Anion gap: 5 (ref 5–15)
BUN: 69 mg/dL — AB (ref 6–20)
CALCIUM: 8.1 mg/dL — AB (ref 8.9–10.3)
CO2: 25 mmol/L (ref 22–32)
CREATININE: 3.88 mg/dL — AB (ref 0.61–1.24)
Chloride: 105 mmol/L (ref 101–111)
GFR, EST AFRICAN AMERICAN: 16 mL/min — AB (ref >60)
GFR, EST NON AFRICAN AMERICAN: 14 mL/min — AB (ref >60)
Glucose, Bld: 111 mg/dL — ABNORMAL HIGH (ref 65–99)
Potassium: 4.4 mmol/L (ref 3.5–5.1)
SODIUM: 135 mmol/L (ref 135–145)

## 2017-06-24 MED ORDER — PREDNISONE 20 MG PO TABS: 20.0000 mg | ORAL_TABLET | Freq: Every day | ORAL | Status: DC

## 2017-06-24 MED ORDER — PREDNISONE 50 MG PO TABS
50.0000 mg | ORAL_TABLET | Freq: Every day | ORAL | Status: DC
  Administered 2017-06-25 – 2017-06-26 (×2): 50 mg via ORAL
  Filled 2017-06-24 (×2): qty 1

## 2017-06-24 MED ORDER — PREDNISONE 20 MG PO TABS
40.0000 mg | ORAL_TABLET | Freq: Every day | ORAL | Status: DC
  Administered 2017-06-26: 10:00:00 40 mg via ORAL
  Filled 2017-06-24: qty 2

## 2017-06-24 MED ORDER — PREDNISONE 20 MG PO TABS: 30.0000 mg | ORAL_TABLET | Freq: Every day | ORAL | Status: DC

## 2017-06-24 MED ORDER — DOCUSATE SODIUM 100 MG PO CAPS
100.0000 mg | ORAL_CAPSULE | Freq: Two times a day (BID) | ORAL | Status: DC
  Administered 2017-06-24 – 2017-06-26 (×4): 100 mg via ORAL
  Filled 2017-06-24 (×4): qty 1

## 2017-06-24 MED ORDER — CEPHALEXIN 500 MG PO CAPS: 500.0000 mg | ORAL_CAPSULE | Freq: Two times a day (BID) | ORAL | Status: DC

## 2017-06-24 MED ORDER — CEFDINIR 300 MG PO CAPS
300.0000 mg | ORAL_CAPSULE | Freq: Every day | ORAL | Status: DC
  Administered 2017-06-24 – 2017-06-26 (×3): 300 mg via ORAL
  Filled 2017-06-24 (×3): qty 1

## 2017-06-24 MED ORDER — PREDNISONE 10 MG PO TABS: 10.0000 mg | ORAL_TABLET | Freq: Every day | ORAL | Status: DC

## 2017-06-24 MED ORDER — ZOLPIDEM TARTRATE 5 MG PO TABS
5.0000 mg | ORAL_TABLET | Freq: Every evening | ORAL | Status: DC | PRN
  Administered 2017-06-24 – 2017-06-25 (×2): 5 mg via ORAL
  Filled 2017-06-24 (×2): qty 1

## 2017-06-24 MED ORDER — SENNA 8.6 MG PO TABS
1.0000 | ORAL_TABLET | Freq: Every day | ORAL | Status: DC
  Administered 2017-06-24 – 2017-06-25 (×2): 8.6 mg via ORAL
  Filled 2017-06-24 (×2): qty 1

## 2017-06-24 NOTE — Progress Notes (Signed)
[place] [facility] [place] NAME: Billy Ortiz    MR#:  [id number]  DATE OF BIRTH:  10/16/1940  SUBJECTIVE:   Cr slightly improved since yesterday. No Hemoptysis, Hg. Stable.    REVIEW OF SYSTEMS:    Review of Systems  Constitutional: Negative for chills and fever.  HENT: Negative for congestion and tinnitus.   Eyes: Negative for blurred vision and double vision.  Respiratory: Negative for cough, hemoptysis, shortness of breath and wheezing.   Cardiovascular: Negative for chest pain, orthopnea and PND.  Gastrointestinal: Negative for abdominal pain, diarrhea, nausea and vomiting.  Genitourinary: Negative for dysuria and hematuria.  Neurological: Negative for dizziness, sensory change and focal weakness.  All other systems reviewed and are negative.   Nutrition: Heart healthy Tolerating Diet: Yes Tolerating PT: Await Eval.   DRUG ALLERGIES:   Allergies  Allergen Reactions  . Tramadol Hives  . Lisinopril     Other reaction(s): Unknown Other reaction(s): Unknown    VITALS:  Blood pressure (!) 183/80, pulse (!) 57, temperature 98 F (36.7 C), temperature source Oral, resp. rate 15, height 5\' 11"  (1.803 m), weight 83.1 kg (183 lb 4.8 oz), SpO2 97 %.  PHYSICAL EXAMINATION:   Physical Exam  GENERAL:  77 y.o.-year-old patient lying in bed in no acute distress.  EYES: Pupils equal, round, reactive to light and accommodation. No scleral icterus. Extraocular muscles intact.  HEENT: Head atraumatic, normocephalic. Oropharynx and nasopharynx clear.  NECK:  Supple, no jugular venous distention. No thyroid enlargement, no tenderness.  LUNGS: Normal breath sounds bilaterally, no wheezing, rales, rhonchi. No use of accessory muscles of respiration.  CARDIOVASCULAR: S1, S2 normal. No murmurs, rubs, or gallops.   ABDOMEN: Soft, nontender, nondistended. Bowel sounds present. No organomegaly or mass.  EXTREMITIES: No cyanosis, clubbing or edema b/l.     NEUROLOGIC: Cranial nerves II through XII are intact. No focal Motor or sensory deficits b/l.   PSYCHIATRIC: The patient is alert and oriented x 3.  SKIN: No obvious rash, lesion, or ulcer.   Right Chest wall port-a-cath in place.   LABORATORY PANEL:   CBC Recent Labs  Lab 06/23/17 0313  WBC 13.5*  HGB 9.7*  HCT 30.0*  PLT 292   ------------------------------------------------------------------------------------------------------------------  Chemistries  Recent Labs  Lab 06/21/17 0054  06/22/17 0607  06/24/17 0741  NA 134*  --  136   < > 135  K 3.4*  --  4.2   < > 4.4  CL 104  --  108   < > 105  CO2 24  --  21*   < > 25  GLUCOSE 113*  --  141*   < > 111*  BUN 26*  --  36*   < > 69*  CREATININE 2.91*  --  3.10*   < > 3.88*  CALCIUM 8.0*  --  8.2*   < > 8.1*  MG  --    < > 2.1  --   --   AST 15  15  --   --   --   --   ALT 8*  9*  --   --   --   --   ALKPHOS 97  94  --   --   --   --   BILITOT 0.9  0.9  --   --   --   --    < > = values in this interval not displayed.   ------------------------------------------------------------------------------------------------------------------  Cardiac Enzymes  Recent Labs  Lab 06/21/17 1303  TROPONINI 0.20*   ------------------------------------------------------------------------------------------------------------------  RADIOLOGY:  No results found.   ASSESSMENT AND PLAN:   77 yo male w/ hx of Lung Cancer s/p recent Bronch with biopsy, HTN, GERD, hyperlipidemia who presents to the hospital due to chest pain and hemoptysis.   1.  Hemoptysis- patient has a history of lung cancer and is status post recent bronchoscopy done earlier this month. - No further Hemoptysis overnight. Hg.  Stable. Seen by pulmonary, no plans for further bronchoscopy.  Cont. To Hold aspirin,  Switch to oral prednisone taper, continue Omnicef for another 3 days.  - Cont.Tessalon pearls PRN for cough.  2.  Chest pain-presently  resolved.  Patient had a mildly elevated troponin.  This is secondary to supply demand ischemia. -Echocardiogram showing normal ejection fraction with no wall motion abnormalities.  Will DC telemetry. -Continue carvedilol, Imdur, hold Losartan.   3.  Hypokalemia/Hypomagnesemia -much improved with supplementation and will continue to monitor.  4.  Hyperlipidemia-continue atorvastatin.  5.  Lung cancer-status post recent bronchoscopy.  And follows with Dr. [name]. [name].   - s/p Port-a-cath placement 2 days ago.  Follow up with Oncology as outpatient.   6. Acute renal Failure - etiology unclear. Not improving with IV fluids for the past 2 days.  - Renal US (-) for hydronephrosis.  Appreciate Nephrology input and this is likely ATN and switched to sodium bicarb yesterday and Cr. Improving and will cont. To monitor.   - hold Losartan, Torsemide for now.   All the records are reviewed and case discussed with Care Management/Social Worker. Management plans discussed with the patient, family and they are in agreement.  CODE STATUS: Full code  DVT Prophylaxis: Ted's & SCD's.   TOTAL TIME TAKING CARE OF THIS PATIENT: 30 minutes.   POSSIBLE D/C IN 2-3 DAYS, DEPENDING ON CLINICAL CONDITION.   Henreitta Leber M.D on 06/24/2017 at 1:18 PM  Between 7am to 6pm - Pager - [phone]  After 6pm go to [email or web address] - [employer] [facility]  Office  [phone]  CC: Primary care physician; [employer]

## 2017-06-24 NOTE — Progress Notes (Signed)
[facility]  ROUNDING NOTE   Subjective:   Cousin at bedside.  Patient states he is starting to feel better.   Bicarb gtt at 46m/hr  Objective:  Vital signs in last 24 hours:  Temp:  [97.7 F (36.5 C)-98 F (36.7 C)] 98 F (36.7 C) (04/26 0420) Pulse Rate:  [47-57] 57 (04/26 0943) Resp:  [15-16] 15 (04/26 0420) BP: (143-183)/(73-80) 183/80 (04/26 0943) SpO2:  [93 %-97 %] 97 % (04/26 0420) Weight:  [83.1 kg (183 lb 4.8 oz)] 83.1 kg (183 lb 4.8 oz) (04/26 0420)  Weight change: 5.126 kg (11 lb 4.8 oz) Filed Weights   06/22/17 1320 06/23/17 0357 06/24/17 0420  Weight: 78 kg (172 lb) 81.1 kg (178 lb 11.2 oz) 83.1 kg (183 lb 4.8 oz)    Intake/Output: I/O last 3 completed shifts: In: 2557.9 [P.O.:600; I.V.:1957.9] Out: 425 [Urine:425]   Intake/Output this shift:  Total I/O In: 480 [P.O.:480] Out: 165 [Urine:165]  Physical Exam: General: NAD,   Head: Normocephalic, atraumatic. Moist oral mucosal membranes  Eyes: Anicteric, PERRL  Neck: Supple, trachea midline  Lungs:  Left diminished  Heart: Regular rate and rhythm, +murmur  Abdomen:  Soft, nontender,   Extremities: no peripheral edema.  Neurologic: Nonfocal, moving all four extremities  Skin: No lesions       Basic Metabolic Panel: Recent Labs  Lab 06/20/17 1757 06/21/17 0054 06/21/17 0055 06/21/17 2331 06/22/17 0607 06/23/17 0313 06/24/17 0741  NA 134* 134*  --   --  136 136 135  K 3.2* 3.4*  --   --  4.2 4.8 4.4  CL 103 104  --   --  108 109 105  CO2 23 24  --   --  21* 20* 25  GLUCOSE 80 113*  --   --  141* 167* 111*  BUN 25* 26*  --   --  36* 52* 69*  CREATININE 2.70* 2.91*  --   --  3.10* 3.95* 3.88*  CALCIUM 8.1* 8.0*  --   --  8.2* 8.1* 8.1*  MG  --   --  1.0* 2.2 2.1  --   --     Liver Function Tests: Recent Labs  Lab 06/21/17 0054  AST 15  15  ALT 8*  9*  ALKPHOS 97  94  BILITOT 0.9  0.9  PROT 6.7  6.9  ALBUMIN 2.4*  2.6*   Recent Labs  Lab 06/21/17 0054   LIPASE 18   No results for input(s): AMMONIA in the last 168 hours.  CBC: Recent Labs  Lab 06/20/17 1757 06/21/17 0054 06/23/17 0313  WBC 14.1* 13.2* 13.5*  HGB 11.1* 10.6* 9.7*  HCT 33.2* 32.0* 30.0*  MCV 71.9* 71.7* 71.7*  PLT 279 277 292    Cardiac Enzymes: Recent Labs  Lab 06/20/17 1757 06/21/17 0054 06/21/17 0706 06/21/17 1303  TROPONINI 0.14* 0.34* 0.25* 0.20*    BNP: Invalid input(s): POCBNP  CBG: No results for input(s): GLUCAP in the last 168 hours.  Microbiology: Results for orders placed or performed during the hospital encounter of 06/03/17  Culture, bal-quantitative     Status: Abnormal   Collection Time: 06/03/17  2:48 PM  Result Value Ref Range Status   Specimen Description   Final    BRONCHIAL ALVEOLAR LAVAGE [facility] [address] [place] [address]   Special Requests   Final    NONE [facility]formed at [facility] [address] [place] [address]  Gram Stain   Final    RARE WBC PRESENT,BOTH PMN AND MONONUCLEAR RARE SQUAMOUS EPITHELIAL CELLS PRESENT NO ORGANISMS SEEN Performed at [facility], [address], [place], [place] [address]    Culture 2,000 COLONIES/mL STAPHYLOCOCCUS AUREUS (A)  Final   Report Status 06/06/2017 FINAL  Final   Organism ID, Bacteria STAPHYLOCOCCUS AUREUS (A)  Final      Susceptibility   Staphylococcus aureus - MIC*    CIPROFLOXACIN <=0.5 SENSITIVE Sensitive     ERYTHROMYCIN <=0.25 SENSITIVE Sensitive     GENTAMICIN <=0.5 SENSITIVE Sensitive     OXACILLIN 0.5 SENSITIVE Sensitive     TETRACYCLINE <=1 SENSITIVE Sensitive     VANCOMYCIN 1 SENSITIVE Sensitive     TRIMETH/SULFA <=10 SENSITIVE Sensitive     CLINDAMYCIN <=0.25 SENSITIVE Sensitive     RIFAMPIN <=0.5 SENSITIVE Sensitive     Inducible Clindamycin NEGATIVE Sensitive     * 2,000 COLONIES/mL STAPHYLOCOCCUS AUREUS  Acid Fast Smear (AFB)     Status: None   Collection Time: 06/03/17  2:48 PM   Result Value Ref Range Status   AFB Specimen Processing Concentration  Final   Acid Fast Smear Negative  Final    Comment: (NOTE) Performed At: [facility] [address] [place], [place] [id number] Rush Farmer MD WI:[id number]    Source (AFB) BRONCHIAL ALVEOLAR LAVAGE  Final    Comment: Performed at [facility], [address] [place], [place] [address]    Coagulation Studies: No results for input(s): LABPROT, INR in the last 72 hours.  Urinalysis: Recent Labs    06/23/17 0819  COLORURINE YELLOW*  LABSPEC 1.018  PHURINE 5.0  GLUCOSEU 50*  HGBUR MODERATE*  BILIRUBINUR NEGATIVE  KETONESUR 5*  PROTEINUR NEGATIVE  NITRITE NEGATIVE  LEUKOCYTESUR NEGATIVE      Imaging: No results found.   Medications:   .  sodium bicarbonate  infusion 1000 mL 75 mL/hr at 06/24/17 0657   . amLODipine  10 mg Oral Daily  . atorvastatin  40 mg Oral q1800  . cefdinir  300 mg Oral Q12H  . fluticasone furoate-vilanterol  1 puff Inhalation Daily  . isosorbide mononitrate  60 mg Oral Daily  . pantoprazole sodium  20 mg Oral BID  . [START ON 06/25/2017] predniSONE  50 mg Oral Q breakfast   And  . [START ON 06/26/2017] predniSONE  40 mg Oral Q breakfast   And  . [START ON 06/27/2017] predniSONE  30 mg Oral Q breakfast   And  . [START ON 06/28/2017] predniSONE  20 mg Oral Q breakfast   And  . [START ON 06/29/2017] predniSONE  10 mg Oral Q breakfast   acetaminophen **OR** acetaminophen, hydrALAZINE, ondansetron **OR** ondansetron (ZOFRAN) IV, oxyCODONE  Assessment/ Plan:  Mr. CAINEN BURNHAM is a 77 y.o. black male with lung cancer, hypertension, GERD, hyperlipidemia, COPD, who was admitted to [facility] on 06/20/2017    1. 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  Chronic kidney disease secondary to hypertension and bilateral  renal cysts.  Acute renal failure seems to be prerenal azotemia that has progressed to ATN.  Not very responsive to IV fluids. However does appear hypovolemic on examination.  - Continue IV fluids sodium bicarbonate. - Continue to hold torsemide and losartan.  - Check serologic work up for pulmonary renal disease. ANCA negative. Pending ANA and anti-GBM  2. Hypertension: elevated. Holding outpatient torsemide and losartan - Continue amlodipine, carvedilol and isosorbide mononitrate.    LOS: 4 Talonda Artist 4/26/20191:21 PM

## 2017-06-24 NOTE — Progress Notes (Signed)
PHARMACY NOTE -  ANTIBIOTIC RENAL DOSE ADJUSTMENT   Patient is ordered cefdinir.  SCr 3.88, estimated CrCl 17 ml/min Will adjust cefdinir dosing to 300 mg daily for ARF.  Will sign off at this time.  Please reconsult if a change in clinical status warrants re-evaluation of dosage.  Ulice Dash, PharmD Clinical Pharmacist

## 2017-06-25 LAB — MPO/PR-3 (ANCA) ANTIBODIES: Myeloperoxidase Abs: <9 U/mL (ref 0.0–9.0)

## 2017-06-25 LAB — BASIC METABOLIC PANEL
Anion gap: 7 (ref 5–15)
BUN: 68 mg/dL — AB (ref 6–20)
CO2: 28 mmol/L (ref 22–32)
CREATININE: 3.23 mg/dL — AB (ref 0.61–1.24)
Calcium: 8 mg/dL — ABNORMAL LOW (ref 8.9–10.3)
Chloride: 101 mmol/L (ref 101–111)
GFR calc Af Amer: 20 mL/min — ABNORMAL LOW (ref >60)
GFR, EST NON AFRICAN AMERICAN: 17 mL/min — AB (ref >60)
GLUCOSE: 135 mg/dL — AB (ref 65–99)
Potassium: 4 mmol/L (ref 3.5–5.1)
Sodium: 136 mmol/L (ref 135–145)

## 2017-06-25 LAB — ANA W/REFLEX: ANA: NEGATIVE

## 2017-06-25 NOTE — Progress Notes (Signed)
[facility]  ROUNDING NOTE   Subjective:   Patient sitting up in bed. Asking about when he may be able to return home and when will he be able to get chemotherapy.   Objective:  Vital signs in last 24 hours:  Temp:  [97.7 F (36.5 C)-97.9 F (36.6 C)] 97.9 F (36.6 C) (04/27 0526) Pulse Rate:  [50-53] 50 (04/27 0526) Resp:  [15-16] 16 (04/27 0526) BP: (158)/(76-81) 158/81 (04/27 0526) SpO2:  [92 %-93 %] 92 % (04/27 0526) Weight:  [83.1 kg (183 lb 4.8 oz)] 83.1 kg (183 lb 4.8 oz) (04/27 0500)  Weight change: 0 kg (0 lb) Filed Weights   06/23/17 0357 06/24/17 0420 06/25/17 0500  Weight: 81.1 kg (178 lb 11.2 oz) 83.1 kg (183 lb 4.8 oz) 83.1 kg (183 lb 4.8 oz)    Intake/Output: I/O last 3 completed shifts: In: 1242.5 [P.O.:480; I.V.:762.5] Out: 440 [Urine:440]   Intake/Output this shift:  Total I/O In: 2901.5 [P.O.:240; I.V.:2661.5] Out: -   Physical Exam: General: NAD,   Head: Normocephalic, atraumatic. Moist oral mucosal membranes  Eyes: Anicteric, PERRL  Neck: Supple, trachea midline  Lungs:  clear  Heart: Regular rate and rhythm, +murmur  Abdomen:  Soft, nontender,   Extremities: trace peripheral edema.  Neurologic: Nonfocal, moving all four extremities  Skin: No lesions       Basic Metabolic Panel: Recent Labs  Lab 06/21/17 0054 06/21/17 0055 06/21/17 2331 06/22/17 0607 06/23/17 0313 06/24/17 0741 06/25/17 0437  NA 134*  --   --  136 136 135 136  K 3.4*  --   --  4.2 4.8 4.4 4.0  CL 104  --   --  108 109 105 101  CO2 24  --   --  21* 20* 25 28  GLUCOSE 113*  --   --  141* 167* 111* 135*  BUN 26*  --   --  36* 52* 69* 68*  CREATININE 2.91*  --   --  3.10* 3.95* 3.88* 3.23*  CALCIUM 8.0*  --   --  8.2* 8.1* 8.1* 8.0*  MG  --  1.0* 2.2 2.1  --   --   --     Liver Function Tests: Recent Labs  Lab 06/21/17 0054  AST 15  15  ALT 8*  9*  ALKPHOS 97  94  BILITOT 0.9  0.9  PROT 6.7  6.9  ALBUMIN 2.4*  2.6*   Recent Labs   Lab 06/21/17 0054  LIPASE 18   No results for input(s): AMMONIA in the last 168 hours.  CBC: Recent Labs  Lab 06/20/17 1757 06/21/17 0054 06/23/17 0313  WBC 14.1* 13.2* 13.5*  HGB 11.1* 10.6* 9.7*  HCT 33.2* 32.0* 30.0*  MCV 71.9* 71.7* 71.7*  PLT 279 277 292    Cardiac Enzymes: Recent Labs  Lab 06/20/17 1757 06/21/17 0054 06/21/17 0706 06/21/17 1303  TROPONINI 0.14* 0.34* 0.25* 0.20*    BNP: Invalid input(s): POCBNP  CBG: No results for input(s): GLUCAP in the last 168 hours.  Microbiology: Results for orders placed or performed during the hospital encounter of 06/03/17  Culture, bal-quantitative     Status: Abnormal   Collection Time: 06/03/17  2:48 PM  Result Value Ref Range Status   Specimen Description   Final    BRONCHIAL ALVEOLAR LAVAGE Performed at [facility], [address], [place], [place] [address]    Special Requests   Final    NONE Performed at [facility],  [place], [place] [address]    Gram Stain   Final    RARE WBC PRESENT,BOTH PMN AND MONONUCLEAR RARE SQUAMOUS EPITHELIAL CELLS PRESENT NO ORGANISMS SEEN Performed at [facility], [place] [address] [place], [place] [address]    Culture 2,000 COLONIES/mL STAPHYLOCOCCUS AUREUS (A)  Final   Report Status 06/06/2017 FINAL  Final   Organism ID, Bacteria STAPHYLOCOCCUS AUREUS (A)  Final      Susceptibility   Staphylococcus aureus - MIC*    CIPROFLOXACIN <=0.5 SENSITIVE Sensitive     ERYTHROMYCIN <=0.25 SENSITIVE Sensitive     GENTAMICIN <=0.5 SENSITIVE Sensitive     OXACILLIN 0.5 SENSITIVE Sensitive     TETRACYCLINE <=1 SENSITIVE Sensitive     VANCOMYCIN 1 SENSITIVE Sensitive     TRIMETH/SULFA <=10 SENSITIVE Sensitive     CLINDAMYCIN <=0.25 SENSITIVE Sensitive     RIFAMPIN <=0.5 SENSITIVE Sensitive     Inducible Clindamycin NEGATIVE Sensitive     * 2,000 COLONIES/mL STAPHYLOCOCCUS AUREUS  Acid Fast Smear (AFB)     Status: None   Collection  Time: 06/03/17  2:48 PM  Result Value Ref Range Status   AFB Specimen Processing Concentration  Final   Acid Fast Smear Negative  Final    Comment: (NOTE) Performed At: [facility] [address] [place], [place] [id number] Rush Farmer MD YS:[id number]    Source (AFB) BRONCHIAL ALVEOLAR LAVAGE  Final    Comment: Performed at [facility], [address] [place], [place] [address]    Coagulation Studies: No results for input(s): LABPROT, INR in the last 72 hours.  Urinalysis: Recent Labs    06/23/17 0819  COLORURINE YELLOW*  LABSPEC 1.018  PHURINE 5.0  GLUCOSEU 50*  HGBUR MODERATE*  BILIRUBINUR NEGATIVE  KETONESUR 5*  PROTEINUR NEGATIVE  NITRITE NEGATIVE  LEUKOCYTESUR NEGATIVE      Imaging: No results found.   Medications:    . amLODipine  10 mg Oral Daily  . atorvastatin  40 mg Oral q1800  . cefdinir  300 mg Oral Daily  . docusate sodium  100 mg Oral BID  . fluticasone furoate-vilanterol  1 puff Inhalation Daily  . isosorbide mononitrate  60 mg Oral Daily  . pantoprazole sodium  20 mg Oral BID  . predniSONE  50 mg Oral Q breakfast   And  . [START ON 06/26/2017] predniSONE  40 mg Oral Q breakfast   And  . [START ON 06/27/2017] predniSONE  30 mg Oral Q breakfast   And  . [START ON 06/28/2017] predniSONE  20 mg Oral Q breakfast   And  . [START ON 06/29/2017] predniSONE  10 mg Oral Q breakfast  . senna  1 tablet Oral QHS   acetaminophen **OR** acetaminophen, hydrALAZINE, ondansetron **OR** ondansetron (ZOFRAN) IV, oxyCODONE, zolpidem  Assessment/ Plan:  [name] is a 77 y.o. black male with lung cancer, hypertension, GERD, hyperlipidemia, COPD, who was admitted to [facility] on 06/20/2017    1. 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  Chronic kidney disease secondary to hypertension and  bilateral renal cysts.  Acute renal failure seems to be prerenal azotemia that has progressed to ATN.  - Discontinue IV fluids sodium bicarbonate. - Continue to hold torsemide and losartan.  - Check serologic work up for pulmonary renal disease. ANCA negative. Pending ANA and anti-GBM  2. Hypertension:  Holding outpatient torsemide and losartan - Continue amlodipine, carvedilol and isosorbide mononitrate.    LOS: 5 Shauntae Reitman 4/27/201911:32 AM

## 2017-06-25 NOTE — Progress Notes (Signed)
Patient ID: Billy Ortiz, male   DOB: [birth year], 77 y.o.   MRN: [id number]  [facility] PROGRESS NOTE  PRANSHU LYSTER VHQ:[id number] DOB: Jul 03, 1940 DOA: 06/20/2017 PCP: [facility] [employer]  HPI/Subjective: Patient feels okay and offers no complaints.  States his appetite is okay and he is urinating good.  Objective: Vitals:   06/24/17 2027 06/25/17 0526  BP: (!) 158/76 (!) 158/81  Pulse: (!) 53 (!) 50  Resp: 15 16  Temp: 97.7 F (36.5 C) 97.9 F (36.6 C)  SpO2: 93% 92%    Filed Weights   06/23/17 0357 06/24/17 0420 06/25/17 0500  Weight: 81.1 kg (178 lb 11.2 oz) 83.1 kg (183 lb 4.8 oz) 83.1 kg (183 lb 4.8 oz)    ROS: Review of Systems  Constitutional: Negative for chills and fever.  Eyes: Negative for blurred vision.  Respiratory: Negative for cough and shortness of breath.   Cardiovascular: Negative for chest pain.  Gastrointestinal: Negative for abdominal pain, constipation, diarrhea, nausea and vomiting.  Genitourinary: Negative for dysuria.  Musculoskeletal: Negative for joint pain.  Neurological: Negative for dizziness and headaches.   Exam: Physical Exam  Constitutional: He is oriented to person, place, and time.  HENT:  Nose: No mucosal edema.  Mouth/Throat: No oropharyngeal exudate or posterior oropharyngeal edema.  Eyes: Pupils are equal, round, and reactive to light. Conjunctivae, EOM and lids are normal.  Neck: No JVD present. Carotid bruit is not present. No edema present. No thyroid mass and no thyromegaly present.  Cardiovascular: S1 normal and S2 normal. Exam reveals no gallop.  No murmur heard. Pulses:      Dorsalis pedis pulses are 2+ on the right side, and 2+ on the left side.  Respiratory: No respiratory distress. He has decreased breath sounds in the right lower field and the left lower field. He has no wheezes. He has no rhonchi. He has no rales.  GI: Soft. Bowel sounds are normal. There is no tenderness.  Musculoskeletal:     Right ankle: He exhibits no swelling.       Left ankle: He exhibits no swelling.  Lymphadenopathy:    He has no cervical adenopathy.  Neurological: He is alert and oriented to person, place, and time. No cranial nerve deficit.  Skin: Skin is warm. No rash noted. Nails show no clubbing.  Psychiatric: He has a normal mood and affect.      Data Reviewed: Basic Metabolic Panel: Recent Labs  Lab 06/21/17 0054 06/21/17 0055 06/21/17 2331 06/22/17 4132 06/23/17 0313 06/24/17 0741 06/25/17 0437  NA 134*  --   --  136 136 135 136  K 3.4*  --   --  4.2 4.8 4.4 4.0  CL 104  --   --  108 109 105 101  CO2 24  --   --  21* 20* 25 28  GLUCOSE 113*  --   --  141* 167* 111* 135*  BUN 26*  --   --  36* 52* 69* 68*  CREATININE 2.91*  --   --  3.10* 3.95* 3.88* 3.23*  CALCIUM 8.0*  --   --  8.2* 8.1* 8.1* 8.0*  MG  --  1.0* 2.2 2.1  --   --   --    Liver Function Tests: Recent Labs  Lab 06/21/17 0054  AST 15  15  ALT 8*  9*  ALKPHOS 97  94  BILITOT 0.9  0.9  PROT 6.7  6.9  ALBUMIN 2.4*  2.6*  Recent Labs  Lab 06/21/17 0054  LIPASE 18   CBC: Recent Labs  Lab 06/20/17 1757 06/21/17 0054 06/23/17 0313  WBC 14.1* 13.2* 13.5*  HGB 11.1* 10.6* 9.7*  HCT 33.2* 32.0* 30.0*  MCV 71.9* 71.7* 71.7*  PLT 279 277 292   Cardiac Enzymes: Recent Labs  Lab 06/20/17 1757 06/21/17 0054 06/21/17 0706 06/21/17 1303  TROPONINI 0.14* 0.34* 0.25* 0.20*   BNP (last 3 results) Recent Labs    06/21/17 0054  BNP 797.0*     Scheduled Meds: . amLODipine  10 mg Oral Daily  . atorvastatin  40 mg Oral q1800  . cefdinir  300 mg Oral Daily  . docusate sodium  100 mg Oral BID  . fluticasone furoate-vilanterol  1 puff Inhalation Daily  . isosorbide mononitrate  60 mg Oral Daily  . pantoprazole sodium  20 mg Oral BID  . predniSONE  50 mg Oral Q breakfast   And  . [START ON 06/26/2017] predniSONE  40 mg Oral Q breakfast   And  . [START ON 06/27/2017] predniSONE  30 mg Oral Q  breakfast   And  . [START ON 06/28/2017] predniSONE  20 mg Oral Q breakfast   And  . [START ON 06/29/2017] predniSONE  10 mg Oral Q breakfast  . senna  1 tablet Oral QHS   Continuous Infusions:  Assessment/Plan:  1. Acute kidney injury on chronic kidney disease stage III.  Nephrology wanted to watch creatinine at least 1 more day.  IV fluids have been stopped. 2. Lung cancer with hemoptysis.  Hold aspirin.  Prednisone taper and Omnicef for a few more days follow-up with oncology and radiation oncology as outpatient.  3. Chest pain has resolved.  Borderline elevated troponin secondary to demand ischemia 4. Hypokalemia and hypomagnesemia.  Previous supplementation 5. Hyperlipidemia unspecified on atorvastatin 6. GERD on PPI  Code Status:     Code Status Orders  (From admission, onward)        Start     Ordered   06/21/17 0034  Full code  Continuous     06/21/17 0033    Code Status History    Date Active Date Inactive Code Status Order ID Comments User Context   05/26/2016 1604 05/26/2016 2031 Full Code [id number]  Corey Skains, MD Inpatient    Advance Directive Documentation     Most Recent Value  Type of Advance Directive  Healthcare Power of Attorney, Living will  Pre-existing out of facility DNR order (yellow form or pink MOST form)  -  "MOST" Form in Place?  -     Disposition Plan: Hopeful discharge tomorrow  Consultants:  Nephrology  Antibiotics:  Omnicef  Time spent: 28 minutes  [place]

## 2017-06-25 NOTE — Plan of Care (Signed)
  Problem: Clinical Measurements: Goal: Respiratory complications will improve Outcome: Progressing  Pt with no hymoptysis to date this shift Problem: Safety: Goal: Ability to remain free from injury will improve Outcome: Progressing  Pt independent, self care and ambulates in the hallway

## 2017-06-26 LAB — RENAL FUNCTION PANEL
ALBUMIN: 2.2 g/dL — AB (ref 3.5–5.0)
Anion gap: 5 (ref 5–15)
BUN: 66 mg/dL — AB (ref 6–20)
CALCIUM: 8 mg/dL — AB (ref 8.9–10.3)
CO2: 30 mmol/L (ref 22–32)
CREATININE: 2.89 mg/dL — AB (ref 0.61–1.24)
Chloride: 102 mmol/L (ref 101–111)
GFR calc Af Amer: 23 mL/min — ABNORMAL LOW (ref >60)
GFR, EST NON AFRICAN AMERICAN: 20 mL/min — AB (ref >60)
GLUCOSE: 122 mg/dL — AB (ref 65–99)
PHOSPHORUS: 3.6 mg/dL (ref 2.5–4.6)
POTASSIUM: 3.8 mmol/L (ref 3.5–5.1)
SODIUM: 137 mmol/L (ref 135–145)

## 2017-06-26 MED ORDER — GUAIFENESIN 100 MG/5ML PO SOLN: 5.0000 mL | ORAL | 0 refills | Status: DC | PRN

## 2017-06-26 MED ORDER — ALBUTEROL SULFATE HFA 108 (90 BASE) MCG/ACT IN AERS: 2.0000 | INHALATION_SPRAY | Freq: Four times a day (QID) | RESPIRATORY_TRACT | 0 refills | Status: AC | PRN

## 2017-06-26 MED ORDER — GUAIFENESIN 100 MG/5ML PO SOLN
5.0000 mL | ORAL | Status: DC | PRN
  Administered 2017-06-26: 100 mg via ORAL
  Filled 2017-06-26 (×2): qty 5

## 2017-06-26 MED ORDER — PREDNISONE 10 MG PO TABS: ORAL_TABLET | ORAL | 0 refills | Status: DC

## 2017-06-26 MED ORDER — CEFDINIR 300 MG PO CAPS: 300.0000 mg | ORAL_CAPSULE | Freq: Every day | ORAL | 0 refills | Status: DC

## 2017-06-26 NOTE — Discharge Summary (Signed)
[facility] [place] NAME: Billy Ortiz    MR#:  [id number]  DATE OF BIRTH:  1940-10-14  DATE OF ADMISSION:  06/20/2017 ADMITTING PHYSICIAN: Lance Coon, MD  DATE OF DISCHARGE: 06/26/2017 10:55 AM  PRIMARY CARE PHYSICIAN: [facility] [place] DIAGNOSIS:  Dehydration [E86.0] Acute renal insufficiency [N28.9] Hemoptysis [R04.2] Diarrhea, unspecified type [R19.7]  DISCHARGE DIAGNOSIS:  Principal Problem:   Hemoptysis Active Problems:   (HFpEF) heart failure with preserved ejection fraction (HCC)   Benign essential HTN   CAD (coronary artery disease)   Gastro-esophageal reflux disease without esophagitis   Hyperlipidemia, unspecified   Moderate COPD (chronic obstructive pulmonary disease) (HCC)   Squamous cell lung cancer, left (HCC)   Acute renal failure superimposed on stage 3 chronic kidney disease (HCC)   Elevated troponin   SECONDARY DIAGNOSIS:   Past Medical History:  Diagnosis Date  . Cancer [facility])    prostate  . Cataract   . Elevated lipids   . GERD (gastroesophageal reflux disease)   . HOH (hard of hearing)   . Hypertension   . Pneumonia   . Pulmonary nodules/lesions, multiple   . Shortness of breath dyspnea     HOSPITAL COURSE:   1.  Acute kidney injury on chronic kidney disease stage III.  Nephrology was giving IV fluids during the hospital course.  Creatinine was 2.7 on admission and went as high as 3.95.  Creatinine came down to 2.89.  Baseline creatinine could be anywhere between 1.49 and 1.86.  Follow-up with nephrology as outpatient. 2.  lung cancer with hemoptysis.  Hold aspirin.  Likely COPD exacerbation with prednisone taper and Omnicef.  Patient will follow up with radiation oncology and oncology on May 1.  Patient on Brio Ellipta.  Albuterol prescribed. 3.  Chest pain has resolved.  Borderline elevated troponin secondary to demand ischemia. 4.  Hypokalemia and hypomagnesemia.  These  were replaced during the hospital course 5.  Hyperlipidemia unspecified on atorvastatin 6.  GERD on PPI. 7.  Bradycardia Coreg stopped 8.  Hypertension.  Continue Norvasc and Imdur.  Patient has torsemide as needed. I am not sure if patient had a reaction to IV hydralazine so I did not prescribe this.  Hopefully can go back on the losartan once creatinine has stabilized.  Check a creatinine as outpatient.  Blood pressure should come down as steroids stopped.    DISCHARGE CONDITIONS:   Satisfactory.  CONSULTS OBTAINED:  Treatment Team:  Erby Pian, MD Lucky Cowboy Erskine Squibb, MD Lavonia Dana, MD  DRUG ALLERGIES:   Allergies  Allergen Reactions  . Tramadol Hives  . Lisinopril     Other reaction(s): Unknown Other reaction(s): Unknown    DISCHARGE MEDICATIONS:   Allergies as of 06/26/2017      Reactions   Tramadol Hives   Lisinopril    Other reaction(s): Unknown Other reaction(s): Unknown      Medication List    STOP taking these medications   aspirin EC 81 MG tablet   carvedilol 3.125 MG tablet Commonly known as:  COREG   losartan 100 MG tablet Commonly known as:  COZAAR   methocarbamol 500 MG tablet Commonly known as:  ROBAXIN   polyethylene glycol powder powder Commonly known as:  GLYCOLAX/MIRALAX     TAKE these medications   albuterol 108 (90 Base) MCG/ACT inhaler Commonly known as:  PROVENTIL HFA;VENTOLIN HFA Inhale 2 puffs into the lungs every 6 (six) hours as needed for  wheezing or shortness of breath.   amLODipine 10 MG tablet Commonly known as:  NORVASC Take 10 mg by mouth daily.   atorvastatin 40 MG tablet Commonly known as:  LIPITOR Take 40 mg by mouth daily at 6 PM.   BREO ELLIPTA 100-25 MCG/INH Aepb Generic drug:  fluticasone furoate-vilanterol Inhale 1 puff into the lungs daily.   cefdinir 300 MG capsule Commonly known as:  OMNICEF Take 1 capsule (300 mg total) by mouth daily.   docusate sodium 100 MG capsule Commonly known as:   COLACE Take 100 mg by mouth daily.   guaiFENesin 100 MG/5ML Soln Commonly known as:  ROBITUSSIN Take 5 mLs (100 mg total) by mouth every 4 (four) hours as needed for cough or to loosen phlegm.   isosorbide mononitrate 60 MG 24 hr tablet Commonly known as:  IMDUR Take 60 mg by mouth daily.   nitroGLYCERIN 0.4 MG SL tablet Commonly known as:  NITROSTAT Place 0.4 mg under the tongue every 5 (five) minutes as needed for chest pain.   omeprazole 20 MG capsule Commonly known as:  PRILOSEC Take 20 mg by mouth 2 (two) times daily before a meal.   predniSONE 10 MG tablet Commonly known as:  DELTASONE 4 tabs po daily for three days   torsemide 20 MG tablet Commonly known as:  DEMADEX Take 20 mg by mouth daily as needed (fluid).   vitamin B-12 500 MCG tablet Commonly known as:  CYANOCOBALAMIN Take 1,000 mcg by mouth daily.        DISCHARGE INSTRUCTIONS:   PMD 1 week follow-up cancer center follow-up with radiation oncology and oncology on May 1 Follow-up Dr. Juleen China 2 weeks  If you experience worsening of your admission symptoms, develop shortness of breath, life threatening emergency, suicidal or homicidal thoughts you must seek medical attention immediately by calling 911 or calling your MD immediately  if symptoms less severe.  You Must read complete instructions/literature along with all the possible adverse reactions/side effects for all the Medicines you take and that have been prescribed to you. Take any new Medicines after you have completely understood and accept all the possible adverse reactions/side effects.   Please note  You were cared for by a hospitalist during your hospital stay. If you have any questions about your discharge medications or the care you received while you were in the hospital after you are discharged, you can call the unit and asked to speak with the hospitalist on call if the hospitalist that took care of you is not available. Once you are  discharged, your primary care physician will handle any further medical issues. Please note that NO REFILLS for any discharge medications will be authorized once you are discharged, as it is imperative that you return to your primary care physician (or establish a relationship with a primary care physician if you do not have one) for your aftercare needs so that they can reassess your need for medications and monitor your lab values.    Today   CHIEF COMPLAINT:   Chief Complaint  Patient presents with  . Dizziness  . Weakness    HISTORY OF PRESENT ILLNESS:  Billy Ortiz  is a 77 y.o. male  presented with weakness and dizziness   VITAL SIGNS:  Blood pressure (!) 182/83, pulse 61, temperature 98 F (36.7 C), temperature source Oral, resp. rate 15, height _0  (1.803 m), weight 86.9 kg (191 lb 9.6 oz), SpO2 95 %.   PHYSICAL EXAMINATION:  GENERAL:  76 y.o.-year-old patient lying in the bed with no acute distress.  EYES: Pupils equal, round, reactive to light and accommodation. No scleral icterus. Extraocular muscles intact.  HEENT: Head atraumatic, normocephalic. Oropharynx and nasopharynx clear.  NECK:  Supple, no jugular venous distention. No thyroid enlargement, no tenderness.  LUNGS: Decreased breath sounds bilaterally, no wheezing, rales,rhonchi or crepitation. No use of accessory muscles of respiration.  CARDIOVASCULAR: S1, S2 normal. No murmurs, rubs, or gallops.  ABDOMEN: Soft, non-tender, non-distended. Bowel sounds present. No organomegaly or mass.  EXTREMITIES: No pedal edema, cyanosis, or clubbing.  NEUROLOGIC: Cranial nerves II through XII are intact. Muscle strength 5/5 in all extremities. Sensation intact. Gait not checked.  PSYCHIATRIC: The patient is alert and oriented x 3.  SKIN: No obvious rash, lesion, or ulcer.   DATA REVIEW:   CBC Recent Labs  Lab 06/23/17 0313  WBC 13.5*  HGB 9.7*  HCT 30.0*  PLT 292    Chemistries  Recent Labs  Lab  06/21/17 0054  06/22/17 0607  06/26/17 0421  NA 134*  --  136   < > 137  K 3.4*  --  4.2   < > 3.8  CL 104  --  108   < > 102  CO2 24  --  21*   < > 30  GLUCOSE 113*  --  141*   < > 122*  BUN 26*  --  36*   < > 66*  CREATININE 2.91*  --  3.10*   < > 2.89*  CALCIUM 8.0*  --  8.2*   < > 8.0*  MG  --    < > 2.1  --   --   AST 15  15  --   --   --   --   ALT 8*  9*  --   --   --   --   ALKPHOS 97  94  --   --   --   --   BILITOT 0.9  0.9  --   --   --   --    < > = values in this interval not displayed.    Cardiac Enzymes Recent Labs  Lab 06/21/17 1303  TROPONINI 0.20*    Microbiology Results  Results for orders placed or performed during the hospital encounter of 06/03/17  Culture, bal-quantitative     Status: Abnormal   Collection Time: 06/03/17  2:48 PM  Result Value Ref Range Status   Specimen Description   Final    BRONCHIAL ALVEOLAR LAVAGE Performed at [facility], [address], [place], [place] [address]    Special Requests   Final    NONE Performed at [facility], [address] [place], [place] [address]    Gram Stain   Final    RARE WBC PRESENT,BOTH PMN AND MONONUCLEAR RARE SQUAMOUS EPITHELIAL CELLS PRESENT NO ORGANISMS SEEN Performed at [facility], [place] [address] [place], [place] [address]    Culture 2,000 COLONIES/mL STAPHYLOCOCCUS AUREUS (A)  Final   Report Status 06/06/2017 FINAL  Final   Organism ID, Bacteria STAPHYLOCOCCUS AUREUS (A)  Final      Susceptibility   Staphylococcus aureus - MIC*    CIPROFLOXACIN <=0.5 SENSITIVE Sensitive     ERYTHROMYCIN <=0.25 SENSITIVE Sensitive     GENTAMICIN <=0.5 SENSITIVE Sensitive     OXACILLIN 0.5 SENSITIVE Sensitive     TETRACYCLINE <=1 SENSITIVE Sensitive     VANCOMYCIN 1 SENSITIVE Sensitive  TRIMETH/SULFA <=10 SENSITIVE Sensitive     CLINDAMYCIN <=0.25 SENSITIVE Sensitive     RIFAMPIN <=0.5 SENSITIVE Sensitive     Inducible Clindamycin NEGATIVE Sensitive     *  2,000 COLONIES/mL STAPHYLOCOCCUS AUREUS  Acid Fast Smear (AFB)     Status: None   Collection Time: 06/03/17  2:48 PM  Result Value Ref Range Status   AFB Specimen Processing Concentration  Final   Acid Fast Smear Negative  Final    Comment: (NOTE) Performed At: [facility] [address] [place], [place] [id number] Rush Farmer MD JA:[id number]    Source (AFB) BRONCHIAL ALVEOLAR LAVAGE  Final    Comment: Performed at [facility], [address] [place], [place] [address]       Management plans discussed with the patient, family and they are in agreement.  CODE STATUS:     Code Status Orders  (From admission, onward)        Start     Ordered   06/21/17 0034  Full code  Continuous     06/21/17 0033    Code Status History    Date Active Date Inactive Code Status Order ID Comments User Context   05/26/2016 1604 05/26/2016 2031 Full Code [id number]  Corey Skains, MD Inpatient    Advance Directive Documentation     Most Recent Value  Type of Advance Directive  Healthcare Power of [place], Living will  Pre-existing out of facility DNR order (yellow form or pink MOST form)  -  "MOST" Form in Place?  -      TOTAL TIME TAKING CARE OF THIS PATIENT: 44mnutes.    RLoletha GrayerM.D on 06/26/2017 at 2:55 PM  Between 7am to 6pm - Pager - [phone] After 6pm go to [email or web address] - password [email or web address] Office  [phone] CC: Primary care physician; Inc, PDIRECTV

## 2017-06-26 NOTE — Progress Notes (Signed)
[facility]  ROUNDING NOTE   Subjective:   Creatinine 2.89 (3.23)  Objective:  Vital signs in last 24 hours:  Temp:  [98 F (36.7 C)] 98 F (36.7 C) (04/28 0440) Pulse Rate:  [53-61] 61 (04/28 0901) Resp:  [15-20] 15 (04/28 0440) BP: (136-189)/(71-89) 182/83 (04/28 0901) SpO2:  [95 %-96 %] 95 % (04/28 0440) Weight:  [86.9 kg (191 lb 9.6 oz)] 86.9 kg (191 lb 9.6 oz) (04/28 0440)  Weight change: 3.765 kg (8 lb 4.8 oz) Filed Weights   06/24/17 0420 06/25/17 0500 06/26/17 0440  Weight: 83.1 kg (183 lb 4.8 oz) 83.1 kg (183 lb 4.8 oz) 86.9 kg (191 lb 9.6 oz)    Intake/Output: I/O last 3 completed shifts: In: 3381.5 [P.O.:720; I.V.:2661.5] Out: 700 [Urine:700]   Intake/Output this shift:  No intake/output data recorded.  Physical Exam: General: NAD,   Head: Normocephalic, atraumatic. Moist oral mucosal membranes  Eyes: Anicteric, PERRL  Neck: Supple, trachea midline  Lungs:  clear  Heart: Regular rate and rhythm, +murmur  Abdomen:  Soft, nontender,   Extremities: No peripheral edema.  Neurologic: Nonfocal, moving all four extremities  Skin: No lesions       Basic Metabolic Panel: Recent Labs  Lab 06/21/17 0055 06/21/17 2331 06/22/17 0607 06/23/17 0313 06/24/17 0741 06/25/17 0437 06/26/17 0421  NA  --   --  136 136 135 136 137  K  --   --  4.2 4.8 4.4 4.0 3.8  CL  --   --  108 109 105 101 102  CO2  --   --  21* 20* _0 GLUCOSE  --   --  141* 167* 111* 135* 122*  BUN  --   --  36* 52* 69* 68* 66*  CREATININE  --   --  3.10* 3.95* 3.88* 3.23* 2.89*  CALCIUM  --   --  8.2* 8.1* 8.1* 8.0* 8.0*  MG 1.0* 2.2 2.1  --   --   --   --   PHOS  --   --   --   --   --   --  3.6    Liver Function Tests: Recent Labs  Lab 06/21/17 0054 06/26/17 0421  AST 15  15  --   ALT 8*  9*  --   ALKPHOS 97  94  --   BILITOT 0.9  0.9  --   PROT 6.7  6.9  --   ALBUMIN 2.4*  2.6* 2.2*   Recent Labs  Lab 06/21/17 0054  LIPASE 18   No results for  input(s): AMMONIA in the last 168 hours.  CBC: Recent Labs  Lab 06/20/17 1757 06/21/17 0054 06/23/17 0313  WBC 14.1* 13.2* 13.5*  HGB 11.1* 10.6* 9.7*  HCT 33.2* 32.0* 30.0*  MCV 71.9* 71.7* 71.7*  PLT 279 277 292    Cardiac Enzymes: Recent Labs  Lab 06/20/17 1757 06/21/17 0054 06/21/17 0706 06/21/17 1303  TROPONINI 0.14* 0.34* 0.25* 0.20*    BNP: Invalid input(s): POCBNP  CBG: No results for input(s): GLUCAP in the last 168 hours.  Microbiology: Results for orders placed or performed during the hospital encounter of 06/03/17  Culture, bal-quantitative     Status: Abnormal   Collection Time: 06/03/17  2:48 PM  Result Value Ref Range Status   Specimen Description   Final    BRONCHIAL ALVEOLAR LAVAGE Performed at [facility], [address], [place], [place] [address]    Special Requests  Final    NONE Performed at [facility], [place], [place] [address]    Gram Stain   Final    RARE WBC PRESENT,BOTH PMN AND MONONUCLEAR RARE SQUAMOUS EPITHELIAL CELLS PRESENT NO ORGANISMS SEEN Performed at [facility], [place] [address], [place], [place] [address]    Culture 2,000 COLONIES/mL STAPHYLOCOCCUS AUREUS (A)  Final   Report Status 06/06/2017 FINAL  Final   Organism ID, Bacteria STAPHYLOCOCCUS AUREUS (A)  Final      Susceptibility   Staphylococcus aureus - MIC*    CIPROFLOXACIN <=0.5 SENSITIVE Sensitive     ERYTHROMYCIN <=0.25 SENSITIVE Sensitive     GENTAMICIN <=0.5 SENSITIVE Sensitive     OXACILLIN 0.5 SENSITIVE Sensitive     TETRACYCLINE <=1 SENSITIVE Sensitive     VANCOMYCIN 1 SENSITIVE Sensitive     TRIMETH/SULFA <=10 SENSITIVE Sensitive     CLINDAMYCIN <=0.25 SENSITIVE Sensitive     RIFAMPIN <=0.5 SENSITIVE Sensitive     Inducible Clindamycin NEGATIVE Sensitive     * 2,000 COLONIES/mL STAPHYLOCOCCUS AUREUS  Acid Fast Smear (AFB)     Status: None   Collection Time: 06/03/17  2:48 PM  Result Value Ref Range Status    AFB Specimen Processing Concentration  Final   Acid Fast Smear Negative  Final    Comment: (NOTE) Performed At: [facility] [address] [place], [place] [id number] Rush Farmer MD KG:[id number]    Source (AFB) BRONCHIAL ALVEOLAR LAVAGE  Final    Comment: Performed at [place] [facility], [address] [place], [place] [address]    Coagulation Studies: No results for input(s): LABPROT, INR in the last 72 hours.  Urinalysis: No results for input(s): COLORURINE, LABSPEC, PHURINE, GLUCOSEU, HGBUR, BILIRUBINUR, KETONESUR, PROTEINUR, UROBILINOGEN, NITRITE, LEUKOCYTESUR in the last 72 hours.  Invalid input(s): APPERANCEUR    Imaging: No results found.   Medications:    . amLODipine  10 mg Oral Daily  . atorvastatin  40 mg Oral q1800  . cefdinir  300 mg Oral Daily  . docusate sodium  100 mg Oral BID  . fluticasone furoate-vilanterol  1 puff Inhalation Daily  . isosorbide mononitrate  60 mg Oral Daily  . pantoprazole sodium  20 mg Oral BID  . predniSONE  50 mg Oral Q breakfast   And  . predniSONE  40 mg Oral Q breakfast   And  . [START ON 06/27/2017] predniSONE  30 mg Oral Q breakfast   And  . [START ON 06/28/2017] predniSONE  20 mg Oral Q breakfast   And  . [START ON 06/29/2017] predniSONE  10 mg Oral Q breakfast  . senna  1 tablet Oral QHS   acetaminophen **OR** acetaminophen, guaiFENesin, ondansetron **OR** ondansetron (ZOFRAN) IV, oxyCODONE  Assessment/ Plan:  [name] is a 77 y.o. black male with lung cancer, hypertension, GERD, hyperlipidemia, COPD, who was admitted to [facility] on 06/20/2017    1. 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  Chronic kidney disease secondary to hypertension and bilateral renal cysts.  Acute renal failure seems to be prerenal azotemia that has progressed to ATN.  -  Continue to hold torsemide and losartan.  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 ANCA negative. Pending ANA and anti-GBM  2. Hypertension:  Holding outpatient torsemide and losartan - Continue amlodipine, carvedilol and isosorbide mononitrate.  Will need  outpatient follow up with Nephrology.     LOS: 6 Angelie Kram 4/28/201911:32 AM

## 2017-06-26 NOTE — Progress Notes (Signed)
Verified with Dr Leslye Peer via telephone call pt's current BP in both arms (see flowsheets); Coreg can't be given d/t HR in the 50's; Losartan can't be given d/t kidney function; current steroids could be a cause of elevated BP; will still plan for discharge and hopefully once steroids have been completed BP will trend down

## 2017-06-26 NOTE — Progress Notes (Signed)
MD order received in [facility] to discharge pt home today; pt's ride present for pt discharge; pt verbally refused a wheelchair for discharge; pt ambulated to the visitor's entrance for discharge

## 2017-06-26 NOTE — Discharge Instructions (Signed)

## 2017-06-27 LAB — GLOMERULAR BASEMENT MEMBRANE ANTIBODIES: GBM Ab: 4 units (ref 0–20)

## 2017-06-28 ENCOUNTER — Telehealth: Payer: Self-pay | Admitting: Internal Medicine

## 2017-06-28 NOTE — Telephone Encounter (Signed)
Pt's son called/ Spoke to pt's son- had questions [id number] to cancer center.   Wants call back at [phone] re: plan. Dr.B

## 2017-06-29 ENCOUNTER — Ambulatory Visit
Admission: RE | Admit: 2017-06-29 | Discharge: 2017-06-29 | Disposition: A | Discharge status: Home / Self Care | Payer: Medicare Other | Source: Ambulatory Visit | Attending: Radiation Oncology | Admitting: Radiation Oncology

## 2017-06-29 ENCOUNTER — Inpatient Hospital Stay: Payer: Medicare Other | Attending: Oncology | Admitting: Oncology

## 2017-06-29 VITALS — BP 205/92 | HR 60 | Temp 96.8°F | Resp 18 | Wt 197.0 lb

## 2017-06-29 DIAGNOSIS — I10 Essential (primary) hypertension: Secondary | ICD-10-CM

## 2017-06-29 DIAGNOSIS — J9611 Chronic respiratory failure with hypoxia: Secondary | ICD-10-CM | POA: Insufficient documentation

## 2017-06-29 DIAGNOSIS — E785 Hyperlipidemia, unspecified: Secondary | ICD-10-CM | POA: Diagnosis not present

## 2017-06-29 DIAGNOSIS — R531 Weakness: Secondary | ICD-10-CM

## 2017-06-29 DIAGNOSIS — C3492 Malignant neoplasm of unspecified part of left bronchus or lung: Secondary | ICD-10-CM | POA: Diagnosis not present

## 2017-06-29 DIAGNOSIS — J441 Chronic obstructive pulmonary disease with (acute) exacerbation: Secondary | ICD-10-CM | POA: Insufficient documentation

## 2017-06-29 DIAGNOSIS — N184 Chronic kidney disease, stage 4 (severe): Secondary | ICD-10-CM | POA: Insufficient documentation

## 2017-06-29 DIAGNOSIS — Z8701 Personal history of pneumonia (recurrent): Secondary | ICD-10-CM | POA: Diagnosis not present

## 2017-06-29 DIAGNOSIS — Z5111 Encounter for antineoplastic chemotherapy: Secondary | ICD-10-CM | POA: Insufficient documentation

## 2017-06-29 DIAGNOSIS — R600 Localized edema: Secondary | ICD-10-CM

## 2017-06-29 DIAGNOSIS — K219 Gastro-esophageal reflux disease without esophagitis: Secondary | ICD-10-CM | POA: Insufficient documentation

## 2017-06-29 DIAGNOSIS — I129 Hypertensive chronic kidney disease with stage 1 through stage 4 chronic kidney disease, or unspecified chronic kidney disease: Secondary | ICD-10-CM | POA: Insufficient documentation

## 2017-06-29 DIAGNOSIS — F17210 Nicotine dependence, cigarettes, uncomplicated: Secondary | ICD-10-CM

## 2017-06-29 DIAGNOSIS — R5383 Other fatigue: Secondary | ICD-10-CM | POA: Insufficient documentation

## 2017-06-29 DIAGNOSIS — Z87891 Personal history of nicotine dependence: Secondary | ICD-10-CM | POA: Insufficient documentation

## 2017-06-29 DIAGNOSIS — Z79899 Other long term (current) drug therapy: Secondary | ICD-10-CM

## 2017-06-29 DIAGNOSIS — Z800 Family history of malignant neoplasm of digestive organs: Secondary | ICD-10-CM | POA: Diagnosis not present

## 2017-06-29 DIAGNOSIS — Z510 Encounter for antineoplastic radiation therapy: Secondary | ICD-10-CM | POA: Insufficient documentation

## 2017-06-29 DIAGNOSIS — Z8546 Personal history of malignant neoplasm of prostate: Secondary | ICD-10-CM | POA: Diagnosis not present

## 2017-06-29 DIAGNOSIS — D509 Iron deficiency anemia, unspecified: Secondary | ICD-10-CM | POA: Insufficient documentation

## 2017-06-29 NOTE — Telephone Encounter (Signed)
Spoke with pt's son and he stated that he will be able to bring pt in today for appts but would like to have his dad scheduled on the [facility] for his daily radiation treatments. Informed pt's son that we will schedule his dad on the [facility] schedule once his radiation treatments are planned today. Pt's son verbalized understanding. Nothing further needed at this time.
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
## 2017-06-30 ENCOUNTER — Other Ambulatory Visit: Payer: Self-pay

## 2017-06-30 ENCOUNTER — Other Ambulatory Visit: Payer: Self-pay | Admitting: *Deleted

## 2017-06-30 ENCOUNTER — Emergency Department: Payer: Medicare Other

## 2017-06-30 ENCOUNTER — Encounter: Payer: Self-pay | Admitting: Emergency Medicine

## 2017-06-30 ENCOUNTER — Emergency Department
Admission: EM | Admit: 2017-06-30 | Discharge: 2017-07-01 | Disposition: A | Discharge status: Home / Self Care | Payer: Medicare Other | Source: Home / Self Care | Attending: Emergency Medicine | Admitting: Emergency Medicine

## 2017-06-30 DIAGNOSIS — N183 Chronic kidney disease, stage 3 (moderate): Secondary | ICD-10-CM | POA: Insufficient documentation

## 2017-06-30 DIAGNOSIS — I130 Hypertensive heart and chronic kidney disease with heart failure and stage 1 through stage 4 chronic kidney disease, or unspecified chronic kidney disease: Secondary | ICD-10-CM | POA: Insufficient documentation

## 2017-06-30 DIAGNOSIS — Z79899 Other long term (current) drug therapy: Secondary | ICD-10-CM

## 2017-06-30 DIAGNOSIS — R0600 Dyspnea, unspecified: Secondary | ICD-10-CM

## 2017-06-30 DIAGNOSIS — Z8546 Personal history of malignant neoplasm of prostate: Secondary | ICD-10-CM | POA: Insufficient documentation

## 2017-06-30 DIAGNOSIS — I5030 Unspecified diastolic (congestive) heart failure: Secondary | ICD-10-CM | POA: Insufficient documentation

## 2017-06-30 DIAGNOSIS — R0602 Shortness of breath: Secondary | ICD-10-CM

## 2017-06-30 DIAGNOSIS — E785 Hyperlipidemia, unspecified: Secondary | ICD-10-CM | POA: Diagnosis not present

## 2017-06-30 DIAGNOSIS — C3490 Malignant neoplasm of unspecified part of unspecified bronchus or lung: Secondary | ICD-10-CM | POA: Insufficient documentation

## 2017-06-30 DIAGNOSIS — J439 Emphysema, unspecified: Secondary | ICD-10-CM | POA: Diagnosis not present

## 2017-06-30 DIAGNOSIS — R5383 Other fatigue: Secondary | ICD-10-CM | POA: Diagnosis not present

## 2017-06-30 DIAGNOSIS — C3402 Malignant neoplasm of left main bronchus: Secondary | ICD-10-CM | POA: Diagnosis not present

## 2017-06-30 DIAGNOSIS — J441 Chronic obstructive pulmonary disease with (acute) exacerbation: Secondary | ICD-10-CM | POA: Diagnosis not present

## 2017-06-30 DIAGNOSIS — I700 Atherosclerosis of aorta: Secondary | ICD-10-CM | POA: Diagnosis not present

## 2017-06-30 DIAGNOSIS — I2510 Atherosclerotic heart disease of native coronary artery without angina pectoris: Secondary | ICD-10-CM | POA: Insufficient documentation

## 2017-06-30 DIAGNOSIS — Z8701 Personal history of pneumonia (recurrent): Secondary | ICD-10-CM | POA: Diagnosis not present

## 2017-06-30 DIAGNOSIS — D509 Iron deficiency anemia, unspecified: Secondary | ICD-10-CM | POA: Diagnosis not present

## 2017-06-30 DIAGNOSIS — I10 Essential (primary) hypertension: Secondary | ICD-10-CM | POA: Diagnosis not present

## 2017-06-30 DIAGNOSIS — K219 Gastro-esophageal reflux disease without esophagitis: Secondary | ICD-10-CM | POA: Diagnosis not present

## 2017-06-30 DIAGNOSIS — R531 Weakness: Secondary | ICD-10-CM | POA: Diagnosis not present

## 2017-06-30 DIAGNOSIS — F17210 Nicotine dependence, cigarettes, uncomplicated: Secondary | ICD-10-CM | POA: Insufficient documentation

## 2017-06-30 LAB — HEPATIC FUNCTION PANEL
ALT: 38 U/L (ref 17–63)
AST: 28 U/L (ref 15–41)
Albumin: 2.7 g/dL — ABNORMAL LOW (ref 3.5–5.0)
Alkaline Phosphatase: 69 U/L (ref 38–126)
BILIRUBIN TOTAL: 0.6 mg/dL (ref 0.3–1.2)
Total Protein: 6.2 g/dL — ABNORMAL LOW (ref 6.5–8.1)

## 2017-06-30 LAB — TROPONIN I
TROPONIN I: 0.03 ng/mL — AB (ref <0.03)
Troponin I: 0.03 ng/mL (ref <0.03)

## 2017-06-30 LAB — BASIC METABOLIC PANEL
Anion gap: 8 (ref 5–15)
BUN: 35 mg/dL — ABNORMAL HIGH (ref 6–20)
CALCIUM: 8.2 mg/dL — AB (ref 8.9–10.3)
CO2: 30 mmol/L (ref 22–32)
CREATININE: 2.47 mg/dL — AB (ref 0.61–1.24)
Chloride: 100 mmol/L — ABNORMAL LOW (ref 101–111)
GFR calc non Af Amer: 24 mL/min — ABNORMAL LOW (ref >60)
GFR, EST AFRICAN AMERICAN: 28 mL/min — AB (ref >60)
Glucose, Bld: 91 mg/dL (ref 65–99)
Potassium: 3.5 mmol/L (ref 3.5–5.1)
Sodium: 138 mmol/L (ref 135–145)

## 2017-06-30 LAB — BRAIN NATRIURETIC PEPTIDE: B Natriuretic Peptide: 1109 pg/mL — ABNORMAL HIGH (ref 0.0–100.0)

## 2017-06-30 LAB — CBC
HCT: 32.7 % — ABNORMAL LOW (ref 40.0–52.0)
Hemoglobin: 10.6 g/dL — ABNORMAL LOW (ref 13.0–18.0)
MCH: 23.2 pg — AB (ref 26.0–34.0)
MCHC: 32.5 g/dL (ref 32.0–36.0)
MCV: 71.6 fL — ABNORMAL LOW (ref 80.0–100.0)
PLATELETS: 294 10*3/uL (ref 150–440)
RBC: 4.57 MIL/uL (ref 4.40–5.90)
RDW: 15.7 % — AB (ref 11.5–14.5)
WBC: 9.7 10*3/uL (ref 3.8–10.6)

## 2017-06-30 LAB — DIFFERENTIAL
BASOS ABS: 0 10*3/uL (ref 0–0.1)
BASOS PCT: 0 %
EOS ABS: 0.5 10*3/uL (ref 0–0.7)
Eosinophils Relative: 5 %
Lymphocytes Relative: 12 %
Lymphs Abs: 1.1 10*3/uL (ref 1.0–3.6)
Monocytes Absolute: 0.4 10*3/uL (ref 0.2–1.0)
Monocytes Relative: 5 %
NEUTROS PCT: 78 %
Neutro Abs: 7.1 10*3/uL — ABNORMAL HIGH (ref 1.4–6.5)

## 2017-06-30 MED ORDER — POTASSIUM CHLORIDE CRYS ER 20 MEQ PO TBCR
40.0000 meq | EXTENDED_RELEASE_TABLET | Freq: Once | ORAL | Status: AC
  Administered 2017-06-30: 40 meq via ORAL
  Filled 2017-06-30: qty 2

## 2017-06-30 MED ORDER — FUROSEMIDE 10 MG/ML IJ SOLN
60.0000 mg | Freq: Once | INTRAMUSCULAR | Status: AC
  Administered 2017-06-30: 60 mg via INTRAVENOUS
  Filled 2017-06-30: qty 8

## 2017-06-30 NOTE — Discharge Instructions (Addendum)
Please seek medical attention for any high fevers, chest pain, shortness of breath, change in behavior, persistent vomiting, bloody stool or any other new or concerning symptoms.  
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
## 2017-06-30 NOTE — ED Triage Notes (Signed)
Pt came in by [facility] with c/o sob hx of left lung ca with radiation and chemo scheduled to start next week. Called ems for 2 days with hx of chf.

## 2017-06-30 NOTE — ED Notes (Signed)
Pt ambulated a short distance around the er - with sat's staying around 96%. Once back to the room the pt started coughing and dropped to 91%.

## 2017-06-30 NOTE — ED Triage Notes (Signed)
Came in on cpap and received albuterol 7.5, solumedrol 125mg  and 1 1/2 " nitro paste/

## 2017-06-30 NOTE — ED Notes (Signed)
Pt was sleeping soundly with the oxygen on (2 liters). Pt awakened and taken off the oxygen to monitor without since he has no oxygen at home.

## 2017-07-02 ENCOUNTER — Emergency Department: Payer: Medicare Other

## 2017-07-02 ENCOUNTER — Ambulatory Visit
Admission: RE | Admit: 2017-07-02 | Discharge: 2017-07-02 | Disposition: A | Discharge status: Home / Self Care | Payer: Medicare Other | Source: Ambulatory Visit | Attending: Oncology | Admitting: Oncology

## 2017-07-02 ENCOUNTER — Encounter: Payer: Self-pay | Admitting: Emergency Medicine

## 2017-07-02 ENCOUNTER — Inpatient Hospital Stay
Admission: EM | Admit: 2017-07-02 | Discharge: 2017-07-11 | DRG: 190 | Disposition: A | Discharge status: Home / Self Care | Payer: Medicare Other | Attending: Family Medicine | Admitting: Family Medicine

## 2017-07-02 ENCOUNTER — Other Ambulatory Visit: Payer: Self-pay

## 2017-07-02 DIAGNOSIS — R0600 Dyspnea, unspecified: Secondary | ICD-10-CM

## 2017-07-02 DIAGNOSIS — R609 Edema, unspecified: Secondary | ICD-10-CM | POA: Diagnosis not present

## 2017-07-02 DIAGNOSIS — J9601 Acute respiratory failure with hypoxia: Secondary | ICD-10-CM | POA: Diagnosis present

## 2017-07-02 DIAGNOSIS — N184 Chronic kidney disease, stage 4 (severe): Secondary | ICD-10-CM | POA: Diagnosis present

## 2017-07-02 DIAGNOSIS — I472 Ventricular tachycardia: Secondary | ICD-10-CM | POA: Diagnosis present

## 2017-07-02 DIAGNOSIS — G319 Degenerative disease of nervous system, unspecified: Secondary | ICD-10-CM | POA: Insufficient documentation

## 2017-07-02 DIAGNOSIS — I252 Old myocardial infarction: Secondary | ICD-10-CM | POA: Diagnosis not present

## 2017-07-02 DIAGNOSIS — I509 Heart failure, unspecified: Secondary | ICD-10-CM

## 2017-07-02 DIAGNOSIS — C3402 Malignant neoplasm of left main bronchus: Secondary | ICD-10-CM | POA: Diagnosis not present

## 2017-07-02 DIAGNOSIS — E782 Mixed hyperlipidemia: Secondary | ICD-10-CM | POA: Diagnosis present

## 2017-07-02 DIAGNOSIS — I10 Essential (primary) hypertension: Secondary | ICD-10-CM | POA: Diagnosis not present

## 2017-07-02 DIAGNOSIS — N281 Cyst of kidney, acquired: Secondary | ICD-10-CM | POA: Diagnosis present

## 2017-07-02 DIAGNOSIS — F17210 Nicotine dependence, cigarettes, uncomplicated: Secondary | ICD-10-CM | POA: Diagnosis present

## 2017-07-02 DIAGNOSIS — Z66 Do not resuscitate: Secondary | ICD-10-CM | POA: Diagnosis present

## 2017-07-02 DIAGNOSIS — K5900 Constipation, unspecified: Secondary | ICD-10-CM | POA: Diagnosis not present

## 2017-07-02 DIAGNOSIS — T502X5A Adverse effect of carbonic-anhydrase inhibitors, benzothiadiazides and other diuretics, initial encounter: Secondary | ICD-10-CM | POA: Diagnosis present

## 2017-07-02 DIAGNOSIS — R0602 Shortness of breath: Secondary | ICD-10-CM | POA: Diagnosis not present

## 2017-07-02 DIAGNOSIS — C3492 Malignant neoplasm of unspecified part of left bronchus or lung: Secondary | ICD-10-CM | POA: Diagnosis present

## 2017-07-02 DIAGNOSIS — C3490 Malignant neoplasm of unspecified part of unspecified bronchus or lung: Secondary | ICD-10-CM | POA: Insufficient documentation

## 2017-07-02 DIAGNOSIS — M7989 Other specified soft tissue disorders: Secondary | ICD-10-CM | POA: Diagnosis not present

## 2017-07-02 DIAGNOSIS — K649 Unspecified hemorrhoids: Secondary | ICD-10-CM | POA: Diagnosis present

## 2017-07-02 DIAGNOSIS — J9600 Acute respiratory failure, unspecified whether with hypoxia or hypercapnia: Secondary | ICD-10-CM | POA: Diagnosis not present

## 2017-07-02 DIAGNOSIS — I2510 Atherosclerotic heart disease of native coronary artery without angina pectoris: Secondary | ICD-10-CM | POA: Diagnosis present

## 2017-07-02 DIAGNOSIS — K626 Ulcer of anus and rectum: Secondary | ICD-10-CM | POA: Diagnosis present

## 2017-07-02 DIAGNOSIS — Z9114 Patient's other noncompliance with medication regimen: Secondary | ICD-10-CM

## 2017-07-02 DIAGNOSIS — N170 Acute kidney failure with tubular necrosis: Secondary | ICD-10-CM | POA: Diagnosis present

## 2017-07-02 DIAGNOSIS — Z800 Family history of malignant neoplasm of digestive organs: Secondary | ICD-10-CM

## 2017-07-02 DIAGNOSIS — H9190 Unspecified hearing loss, unspecified ear: Secondary | ICD-10-CM | POA: Diagnosis present

## 2017-07-02 DIAGNOSIS — I129 Hypertensive chronic kidney disease with stage 1 through stage 4 chronic kidney disease, or unspecified chronic kidney disease: Secondary | ICD-10-CM | POA: Diagnosis not present

## 2017-07-02 DIAGNOSIS — J9620 Acute and chronic respiratory failure, unspecified whether with hypoxia or hypercapnia: Secondary | ICD-10-CM | POA: Diagnosis not present

## 2017-07-02 DIAGNOSIS — R9082 White matter disease, unspecified: Secondary | ICD-10-CM

## 2017-07-02 DIAGNOSIS — J449 Chronic obstructive pulmonary disease, unspecified: Secondary | ICD-10-CM | POA: Diagnosis not present

## 2017-07-02 DIAGNOSIS — K219 Gastro-esophageal reflux disease without esophagitis: Secondary | ICD-10-CM | POA: Diagnosis present

## 2017-07-02 DIAGNOSIS — I440 Atrioventricular block, first degree: Secondary | ICD-10-CM | POA: Diagnosis present

## 2017-07-02 DIAGNOSIS — I739 Peripheral vascular disease, unspecified: Secondary | ICD-10-CM

## 2017-07-02 DIAGNOSIS — M5136 Other intervertebral disc degeneration, lumbar region: Secondary | ICD-10-CM | POA: Diagnosis not present

## 2017-07-02 DIAGNOSIS — E538 Deficiency of other specified B group vitamins: Secondary | ICD-10-CM | POA: Diagnosis present

## 2017-07-02 DIAGNOSIS — Z961 Presence of intraocular lens: Secondary | ICD-10-CM | POA: Diagnosis present

## 2017-07-02 DIAGNOSIS — R5383 Other fatigue: Secondary | ICD-10-CM | POA: Diagnosis not present

## 2017-07-02 DIAGNOSIS — I6523 Occlusion and stenosis of bilateral carotid arteries: Secondary | ICD-10-CM | POA: Diagnosis present

## 2017-07-02 DIAGNOSIS — R21 Rash and other nonspecific skin eruption: Secondary | ICD-10-CM | POA: Diagnosis not present

## 2017-07-02 DIAGNOSIS — I272 Pulmonary hypertension, unspecified: Secondary | ICD-10-CM | POA: Diagnosis present

## 2017-07-02 DIAGNOSIS — N179 Acute kidney failure, unspecified: Secondary | ICD-10-CM

## 2017-07-02 DIAGNOSIS — Z888 Allergy status to other drugs, medicaments and biological substances status: Secondary | ICD-10-CM

## 2017-07-02 DIAGNOSIS — N183 Chronic kidney disease, stage 3 (moderate): Secondary | ICD-10-CM | POA: Diagnosis not present

## 2017-07-02 DIAGNOSIS — I130 Hypertensive heart and chronic kidney disease with heart failure and stage 1 through stage 4 chronic kidney disease, or unspecified chronic kidney disease: Secondary | ICD-10-CM | POA: Diagnosis present

## 2017-07-02 DIAGNOSIS — Z79899 Other long term (current) drug therapy: Secondary | ICD-10-CM

## 2017-07-02 DIAGNOSIS — D631 Anemia in chronic kidney disease: Secondary | ICD-10-CM | POA: Diagnosis not present

## 2017-07-02 DIAGNOSIS — Z8701 Personal history of pneumonia (recurrent): Secondary | ICD-10-CM

## 2017-07-02 DIAGNOSIS — I5022 Chronic systolic (congestive) heart failure: Secondary | ICD-10-CM | POA: Diagnosis present

## 2017-07-02 DIAGNOSIS — E860 Dehydration: Secondary | ICD-10-CM | POA: Diagnosis present

## 2017-07-02 DIAGNOSIS — Z8546 Personal history of malignant neoplasm of prostate: Secondary | ICD-10-CM

## 2017-07-02 DIAGNOSIS — J441 Chronic obstructive pulmonary disease with (acute) exacerbation: Secondary | ICD-10-CM | POA: Diagnosis present

## 2017-07-02 DIAGNOSIS — J9621 Acute and chronic respiratory failure with hypoxia: Secondary | ICD-10-CM | POA: Diagnosis not present

## 2017-07-02 DIAGNOSIS — Z951 Presence of aortocoronary bypass graft: Secondary | ICD-10-CM

## 2017-07-02 DIAGNOSIS — E785 Hyperlipidemia, unspecified: Secondary | ICD-10-CM | POA: Diagnosis not present

## 2017-07-02 DIAGNOSIS — C771 Secondary and unspecified malignant neoplasm of intrathoracic lymph nodes: Secondary | ICD-10-CM | POA: Diagnosis not present

## 2017-07-02 DIAGNOSIS — R531 Weakness: Secondary | ICD-10-CM | POA: Diagnosis not present

## 2017-07-02 DIAGNOSIS — Z9842 Cataract extraction status, left eye: Secondary | ICD-10-CM

## 2017-07-02 LAB — CBC
HCT: 34.1 % — ABNORMAL LOW (ref 40.0–52.0)
HEMOGLOBIN: 11.3 g/dL — AB (ref 13.0–18.0)
MCH: 23.8 pg — AB (ref 26.0–34.0)
MCHC: 33.3 g/dL (ref 32.0–36.0)
MCV: 71.6 fL — AB (ref 80.0–100.0)
PLATELETS: 233 10*3/uL (ref 150–440)
RBC: 4.77 MIL/uL (ref 4.40–5.90)
RDW: 16.2 % — ABNORMAL HIGH (ref 11.5–14.5)
WBC: 10.4 10*3/uL (ref 3.8–10.6)

## 2017-07-02 LAB — BASIC METABOLIC PANEL
ANION GAP: 9 (ref 5–15)
BUN: 41 mg/dL — ABNORMAL HIGH (ref 6–20)
CHLORIDE: 99 mmol/L — AB (ref 101–111)
CO2: 31 mmol/L (ref 22–32)
CREATININE: 2.62 mg/dL — AB (ref 0.61–1.24)
Calcium: 8.6 mg/dL — ABNORMAL LOW (ref 8.9–10.3)
GFR calc non Af Amer: 22 mL/min — ABNORMAL LOW (ref >60)
GFR, EST AFRICAN AMERICAN: 26 mL/min — AB (ref >60)
Glucose, Bld: 68 mg/dL (ref 65–99)
POTASSIUM: 4 mmol/L (ref 3.5–5.1)
SODIUM: 139 mmol/L (ref 135–145)

## 2017-07-02 LAB — TROPONIN I: TROPONIN I: 0.05 ng/mL — AB (ref <0.03)

## 2017-07-02 LAB — BRAIN NATRIURETIC PEPTIDE: B Natriuretic Peptide: 1801 pg/mL — ABNORMAL HIGH (ref 0.0–100.0)

## 2017-07-02 MED ORDER — ACETAMINOPHEN 650 MG RE SUPP: 650.0000 mg | Freq: Four times a day (QID) | RECTAL | Status: DC | PRN

## 2017-07-02 MED ORDER — HEPARIN SODIUM (PORCINE) 5000 UNIT/ML IJ SOLN
5000.0000 [IU] | Freq: Three times a day (TID) | INTRAMUSCULAR | Status: DC
Start: 2017-07-02 — End: 2017-07-11
  Administered 2017-07-02 – 2017-07-11 (×24): 5000 [IU] via SUBCUTANEOUS
  Filled 2017-07-02 (×25): qty 1

## 2017-07-02 MED ORDER — ONDANSETRON HCL 4 MG/2ML IJ SOLN: 4.0000 mg | Freq: Four times a day (QID) | INTRAMUSCULAR | Status: DC | PRN

## 2017-07-02 MED ORDER — ALBUTEROL SULFATE (2.5 MG/3ML) 0.083% IN NEBU
5.0000 mg | INHALATION_SOLUTION | Freq: Once | RESPIRATORY_TRACT | Status: AC
  Administered 2017-07-02: 5 mg via RESPIRATORY_TRACT

## 2017-07-02 MED ORDER — HYDRALAZINE HCL 20 MG/ML IJ SOLN
10.0000 mg | Freq: Four times a day (QID) | INTRAMUSCULAR | Status: DC | PRN
  Administered 2017-07-02 – 2017-07-03 (×2): 10 mg via INTRAVENOUS
  Filled 2017-07-02 (×2): qty 1

## 2017-07-02 MED ORDER — ACETAMINOPHEN 325 MG PO TABS
650.0000 mg | ORAL_TABLET | Freq: Four times a day (QID) | ORAL | Status: DC | PRN
  Administered 2017-07-03: 650 mg via ORAL
  Filled 2017-07-02: qty 2

## 2017-07-02 MED ORDER — ALBUTEROL SULFATE (2.5 MG/3ML) 0.083% IN NEBU
INHALATION_SOLUTION | RESPIRATORY_TRACT | Status: AC
  Filled 2017-07-02: qty 6

## 2017-07-02 MED ORDER — DOCUSATE SODIUM 100 MG PO CAPS
100.0000 mg | ORAL_CAPSULE | Freq: Every day | ORAL | Status: DC
  Administered 2017-07-02 – 2017-07-11 (×10): 100 mg via ORAL
  Filled 2017-07-02 (×10): qty 1

## 2017-07-02 MED ORDER — ALBUTEROL SULFATE (2.5 MG/3ML) 0.083% IN NEBU
2.5000 mg | INHALATION_SOLUTION | RESPIRATORY_TRACT | Status: DC | PRN
  Administered 2017-07-02 – 2017-07-09 (×4): 2.5 mg via RESPIRATORY_TRACT
  Filled 2017-07-02 (×4): qty 3

## 2017-07-02 MED ORDER — IPRATROPIUM-ALBUTEROL 0.5-2.5 (3) MG/3ML IN SOLN
3.0000 mL | Freq: Four times a day (QID) | RESPIRATORY_TRACT | Status: DC
  Administered 2017-07-02 – 2017-07-03 (×4): 3 mL via RESPIRATORY_TRACT
  Filled 2017-07-02 (×4): qty 3

## 2017-07-02 MED ORDER — TORSEMIDE 20 MG PO TABS
60.0000 mg | ORAL_TABLET | Freq: Every day | ORAL | Status: DC
  Administered 2017-07-03 – 2017-07-05 (×3): 60 mg via ORAL
  Filled 2017-07-02 (×3): qty 3

## 2017-07-02 MED ORDER — PANTOPRAZOLE SODIUM 40 MG PO TBEC
40.0000 mg | DELAYED_RELEASE_TABLET | Freq: Every day | ORAL | Status: DC
  Administered 2017-07-02 – 2017-07-11 (×10): 40 mg via ORAL
  Filled 2017-07-02 (×10): qty 1

## 2017-07-02 MED ORDER — FLUTICASONE FUROATE-VILANTEROL 100-25 MCG/INH IN AEPB
1.0000 | INHALATION_SPRAY | Freq: Every day | RESPIRATORY_TRACT | Status: DC
  Administered 2017-07-02 – 2017-07-11 (×10): 1 via RESPIRATORY_TRACT
  Filled 2017-07-02 (×2): qty 28

## 2017-07-02 MED ORDER — METHYLPREDNISOLONE SODIUM SUCC 125 MG IJ SOLR
60.0000 mg | Freq: Two times a day (BID) | INTRAMUSCULAR | Status: DC
  Administered 2017-07-02 – 2017-07-05 (×6): 60 mg via INTRAVENOUS
  Filled 2017-07-02 (×6): qty 2

## 2017-07-02 MED ORDER — METHYLPREDNISOLONE SODIUM SUCC 125 MG IJ SOLR
125.0000 mg | Freq: Once | INTRAMUSCULAR | Status: AC
  Administered 2017-07-02: 125 mg via INTRAVENOUS
  Filled 2017-07-02: qty 2

## 2017-07-02 MED ORDER — ISOSORBIDE MONONITRATE ER 60 MG PO TB24
60.0000 mg | ORAL_TABLET | Freq: Every day | ORAL | Status: DC
  Administered 2017-07-02 – 2017-07-11 (×10): 60 mg via ORAL
  Filled 2017-07-02 (×10): qty 1

## 2017-07-02 MED ORDER — FUROSEMIDE 10 MG/ML IJ SOLN
80.0000 mg | INTRAMUSCULAR | Status: AC
  Administered 2017-07-02: 80 mg via INTRAVENOUS

## 2017-07-02 MED ORDER — GUAIFENESIN 100 MG/5ML PO SOLN
5.0000 mL | ORAL | Status: DC | PRN
  Filled 2017-07-02: qty 5

## 2017-07-02 MED ORDER — ONDANSETRON HCL 4 MG PO TABS: 4.0000 mg | ORAL_TABLET | Freq: Four times a day (QID) | ORAL | Status: DC | PRN

## 2017-07-02 MED ORDER — ATORVASTATIN CALCIUM 20 MG PO TABS
40.0000 mg | ORAL_TABLET | Freq: Every day | ORAL | Status: DC
  Administered 2017-07-02 – 2017-07-10 (×9): 40 mg via ORAL
  Filled 2017-07-02 (×9): qty 2

## 2017-07-02 MED ORDER — POLYETHYLENE GLYCOL 3350 17 G PO PACK: 17.0000 g | PACK | Freq: Every day | ORAL | Status: DC | PRN

## 2017-07-02 MED ORDER — SODIUM CHLORIDE 0.9% FLUSH
3.0000 mL | Freq: Two times a day (BID) | INTRAVENOUS | Status: DC
  Administered 2017-07-02 – 2017-07-11 (×16): 3 mL via INTRAVENOUS

## 2017-07-02 MED ORDER — IPRATROPIUM-ALBUTEROL 0.5-2.5 (3) MG/3ML IN SOLN
3.0000 mL | Freq: Once | RESPIRATORY_TRACT | Status: AC
  Administered 2017-07-02: 3 mL via RESPIRATORY_TRACT
  Filled 2017-07-02: qty 3

## 2017-07-02 MED ORDER — NITROGLYCERIN 0.4 MG SL SUBL: 0.4000 mg | SUBLINGUAL_TABLET | SUBLINGUAL | Status: DC | PRN

## 2017-07-02 MED ORDER — FUROSEMIDE 10 MG/ML IJ SOLN
INTRAMUSCULAR | Status: AC
  Administered 2017-07-02: 80 mg via INTRAVENOUS
  Filled 2017-07-02: qty 10

## 2017-07-02 NOTE — ED Notes (Signed)
Report called - they will call back.
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
## 2017-07-02 NOTE — ED Triage Notes (Signed)
Arrives with C/O wheezing and lower extremity swelling.  Son states patient is out of medications, son is unsure how long he has been out of medications.

## 2017-07-02 NOTE — ED Notes (Signed)
Report called  

## 2017-07-02 NOTE — Progress Notes (Signed)
Pt. Refuses bed alarm. CNA and other staff informed. Pt. Educated on risk of falling , states he understands but continues to refuse bed alarm.  Will continue to monitor pt.
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
## 2017-07-02 NOTE — ED Notes (Signed)
From pt's med list the medications the son says he is out of is an antibiotic and steriod that was from the last time pt was ill, and nitrostat. The son states pt never got the steroid filled. Also the pt's torsemide was increased from as needed to daily just a few days ago, and was for the build up of fluid in the pt's legs.

## 2017-07-02 NOTE — Progress Notes (Signed)
Advance care planning  Purpose of Encounter COPD, Lung cancer, CODE STATUS discussion  Parties in Attendance Patient and son at bedside  Patients Decisional capacity Alert and oriented.  Able to make own decisions  Patient has COPD and continued to smoke until recent admission.  He has had worsening wheezing and shortness of breath.  With his recent new lung diagnosis is waiting for radiation treatment.  He has had ongoing wheezing and is concerned about it.  Explained to him course of COPD that it would worsen with time and with appropriate treatment we can delay the progression.  He is hopeful that with radiation and further treatments his lung cancer would not cause any further problems. He does not have anybody designated as his Advice worker but 1-3 kids to make decisions together.  We discussed regarding CODE STATUS.  After discussing with his son he has decided to be DO NOT RESUSCITATE and DO NOT INTUBATE.  Poor long-term prognosis with COPD and new diagnosis of lung cancer  Not resuscitate  Time spent-20 minutes

## 2017-07-03 LAB — BASIC METABOLIC PANEL
Anion gap: 10 (ref 5–15)
BUN: 47 mg/dL — ABNORMAL HIGH (ref 6–20)
CALCIUM: 8.1 mg/dL — AB (ref 8.9–10.3)
CO2: 28 mmol/L (ref 22–32)
CREATININE: 3.13 mg/dL — AB (ref 0.61–1.24)
Chloride: 100 mmol/L — ABNORMAL LOW (ref 101–111)
GFR calc Af Amer: 21 mL/min — ABNORMAL LOW (ref >60)
GFR calc non Af Amer: 18 mL/min — ABNORMAL LOW (ref >60)
GLUCOSE: 225 mg/dL — AB (ref 65–99)
Potassium: 4.3 mmol/L (ref 3.5–5.1)
Sodium: 138 mmol/L (ref 135–145)

## 2017-07-03 LAB — CBC
HCT: 27.4 % — ABNORMAL LOW (ref 40.0–52.0)
HEMOGLOBIN: 9.2 g/dL — AB (ref 13.0–18.0)
MCH: 23.8 pg — AB (ref 26.0–34.0)
MCHC: 33.5 g/dL (ref 32.0–36.0)
MCV: 71 fL — AB (ref 80.0–100.0)
Platelets: 193 10*3/uL (ref 150–440)
RBC: 3.85 MIL/uL — ABNORMAL LOW (ref 4.40–5.90)
RDW: 15.7 % — ABNORMAL HIGH (ref 11.5–14.5)
WBC: 5.8 10*3/uL (ref 3.8–10.6)

## 2017-07-03 MED ORDER — HYDRALAZINE HCL 25 MG PO TABS
25.0000 mg | ORAL_TABLET | Freq: Two times a day (BID) | ORAL | Status: DC
  Administered 2017-07-03 – 2017-07-08 (×12): 25 mg via ORAL
  Filled 2017-07-03 (×12): qty 1

## 2017-07-03 MED ORDER — IPRATROPIUM-ALBUTEROL 0.5-2.5 (3) MG/3ML IN SOLN
3.0000 mL | Freq: Three times a day (TID) | RESPIRATORY_TRACT | Status: DC
  Administered 2017-07-03 – 2017-07-06 (×8): 3 mL via RESPIRATORY_TRACT
  Filled 2017-07-03 (×9): qty 3
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
## 2017-07-03 NOTE — Progress Notes (Signed)
Son Billy Ortiz) called.  Asked that he please be called if / when his dad is going home.  Please give him a time frame of when, so he can get his father.  RN please call [phone] to tell him when his father will be discharged.  He also asked medical questions about his father's cancer treatment.  RN will put a sticky note for the MD to call him to answer those questions.  Phillis Knack, RN

## 2017-07-03 NOTE — Plan of Care (Deleted)
Patient is eating well.

## 2017-07-03 NOTE — Progress Notes (Addendum)
BP 175/91 PRN meds for BP begin at 180 MD was notified Patient asked Respiratory therapist and RN for a cigarette.    Patient moves a lot in bed and the alarm goes off continuously.  He asked for the alarm to be off.  RN tried reprogramming the alarm to make it less sensitive.  However it went off multiple times during patient care.  Patient refused alarm and was getting agitated. Alarm was turned off per patient request after explaining why the alarm is beneficial.    Phillis Knack, RN    MD said to give the PRN now (9:48am).

## 2017-07-04 DIAGNOSIS — N183 Chronic kidney disease, stage 3 (moderate): Secondary | ICD-10-CM

## 2017-07-04 DIAGNOSIS — Z8546 Personal history of malignant neoplasm of prostate: Secondary | ICD-10-CM

## 2017-07-04 DIAGNOSIS — I10 Essential (primary) hypertension: Secondary | ICD-10-CM

## 2017-07-04 DIAGNOSIS — C771 Secondary and unspecified malignant neoplasm of intrathoracic lymph nodes: Secondary | ICD-10-CM

## 2017-07-04 DIAGNOSIS — R531 Weakness: Secondary | ICD-10-CM

## 2017-07-04 DIAGNOSIS — M7989 Other specified soft tissue disorders: Secondary | ICD-10-CM

## 2017-07-04 DIAGNOSIS — K219 Gastro-esophageal reflux disease without esophagitis: Secondary | ICD-10-CM

## 2017-07-04 DIAGNOSIS — F17210 Nicotine dependence, cigarettes, uncomplicated: Secondary | ICD-10-CM

## 2017-07-04 DIAGNOSIS — J441 Chronic obstructive pulmonary disease with (acute) exacerbation: Principal | ICD-10-CM

## 2017-07-04 DIAGNOSIS — I129 Hypertensive chronic kidney disease with stage 1 through stage 4 chronic kidney disease, or unspecified chronic kidney disease: Secondary | ICD-10-CM

## 2017-07-04 DIAGNOSIS — R5383 Other fatigue: Secondary | ICD-10-CM

## 2017-07-04 DIAGNOSIS — R609 Edema, unspecified: Secondary | ICD-10-CM

## 2017-07-04 DIAGNOSIS — N179 Acute kidney failure, unspecified: Secondary | ICD-10-CM

## 2017-07-04 DIAGNOSIS — E785 Hyperlipidemia, unspecified: Secondary | ICD-10-CM

## 2017-07-04 DIAGNOSIS — R0602 Shortness of breath: Secondary | ICD-10-CM

## 2017-07-04 DIAGNOSIS — C3402 Malignant neoplasm of left main bronchus: Secondary | ICD-10-CM

## 2017-07-04 LAB — BASIC METABOLIC PANEL
Anion gap: 10 (ref 5–15)
BUN: 52 mg/dL — AB (ref 6–20)
CALCIUM: 8.2 mg/dL — AB (ref 8.9–10.3)
CO2: 29 mmol/L (ref 22–32)
Chloride: 98 mmol/L — ABNORMAL LOW (ref 101–111)
Creatinine, Ser: 3.23 mg/dL — ABNORMAL HIGH (ref 0.61–1.24)
GFR calc Af Amer: 20 mL/min — ABNORMAL LOW (ref >60)
GFR calc non Af Amer: 17 mL/min — ABNORMAL LOW (ref >60)
GLUCOSE: 184 mg/dL — AB (ref 65–99)
Potassium: 4 mmol/L (ref 3.5–5.1)
Sodium: 137 mmol/L (ref 135–145)

## 2017-07-04 LAB — POCT I-STAT CREATININE

## 2017-07-04 NOTE — Progress Notes (Signed)
Seen by oncologist to start concurrent chemoradiation on 07/08/17,continues on torsemide for bilateral lower extremities edema and diuresing,continues on 2 liters oxygen.
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
## 2017-07-04 NOTE — Progress Notes (Signed)
[place] [facility] [place] NAME: Billy Ortiz    MR#:  [id number]  DATE OF BIRTH:  06/12/1940  SUBJECTIVE:  Patient seen and evaluated today Has decreased wheezing and shortness of breath On oxygen via nasal cannula Had elevated blood pressure this morning No headaches, dizziness And active cigarette smoker Has swelling in the lower extremities   REVIEW OF SYSTEMS:    ROS  CONSTITUTIONAL: No documented fever. No fatigue, weakness. No weight gain, no weight loss.  EYES: No blurry or double vision.  ENT: No tinnitus. No postnasal drip. No redness of the oropharynx.  RESPIRATORY: Occasional cough, Has decreased wheeze, no hemoptysis. Has dyspnea.  CARDIOVASCULAR: No chest pain. No orthopnea. No palpitations. No syncope.  GASTROINTESTINAL: No nausea, no vomiting or diarrhea. No abdominal pain. No melena or hematochezia.  GENITOURINARY: No dysuria or hematuria.  ENDOCRINE: No polyuria or nocturia. No heat or cold intolerance.  HEMATOLOGY: No anemia. No bruising. No bleeding.  INTEGUMENTARY: No rashes. No lesions.  MUSCULOSKELETAL: No arthritis. No swelling. No gout.  Swelling in legs NEUROLOGIC: No numbness, tingling, or ataxia. No seizure-type activity.  PSYCHIATRIC: No anxiety. No insomnia. No ADD.   DRUG ALLERGIES:   Allergies  Allergen Reactions  . Tramadol Hives  . Lisinopril     Other reaction(s): Unknown Other reaction(s): Unknown    VITALS:  Blood pressure (!) 165/81, pulse 74, temperature 98 F (36.7 C), resp. rate 18, height 6\' 4"  (1.93 m), weight 86.7 kg (191 lb 3.2 oz), SpO2 99 %.  PHYSICAL EXAMINATION:   Physical Exam  GENERAL:  77 y.o.-year-old patient lying in the bed on oxygen via nasal canula EYES: Pupils equal, round, reactive to light and accommodation. No scleral icterus. Extraocular muscles intact.  HEENT: Head atraumatic, normocephalic. Oropharynx and nasopharynx clear.  NECK:  Supple, no jugular venous  distention. No thyroid enlargement, no tenderness.  LUNGS: Improved breath sounds bilaterally, Bilateral wheezing decreased. No use of accessory muscles of respiration.  CARDIOVASCULAR: S1, S2 normal. No murmurs, rubs, or gallops.  ABDOMEN: Soft, nontender, nondistended. Bowel sounds present. No organomegaly or mass.  EXTREMITIES: No cyanosis, clubbing  Has edema b/l.    NEUROLOGIC: Cranial nerves II through XII are intact. No focal Motor or sensory deficits b/l.   PSYCHIATRIC: The patient is alert and oriented x 3.  SKIN: No obvious rash, lesion, or ulcer.   LABORATORY PANEL:   CBC Recent Labs  Lab 07/03/17 0510  WBC 5.8  HGB 9.2*  HCT 27.4*  PLT 193   ------------------------------------------------------------------------------------------------------------------ Chemistries  Recent Labs  Lab 06/30/17 1831  07/04/17 0530  NA  --    < > 137  K  --    < > 4.0  CL  --    < > 98*  CO2  --    < > 29  GLUCOSE  --    < > 184*  BUN  --    < > 52*  CREATININE  --    < > 3.23*  CALCIUM  --    < > 8.2*  AST 28  --   --   ALT 38  --   --   ALKPHOS 69  --   --   BILITOT 0.6  --   --    < > = values in this interval not displayed.   ------------------------------------------------------------------------------------------------------------------  Cardiac Enzymes Recent Labs  Lab 07/02/17 1045  TROPONINI 0.05*   ------------------------------------------------------------------------------------------------------------------  RADIOLOGY:  No results found.  ASSESSMENT AND PLAN:   77 year old male patient with history of chronic kidney disease stage IV, squamous cell lung cancer stage III, COPD currently under service for COPD exacerbation and respiratory distress.  - Acute hypoxic respiratory distress secondary to COPD exacerbation improving Continue oxygen via nasal cannula Does not use home oxygen Will try to wean oxygen Nebulization treatments  - Acute COPD  exacerbation Nebulization treatments IV steroids Inhalers  - Bilateral lower extremity edema secondary to pulmonary hypertension Continue diuresis with torsemide Renal function worsened We will get nephrology consultation  - 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 Radiation therapy and medical oncology consultation  -CKD stage IV Worsening renal function probably secondary to diuresis Will get  expert nephrology opinion  - DVT prophylaxis subcu heparin  -Uncontrolled hypertension Continue PRN IV hydralazine  added oral hydralazine for better control of blood pressure  -Tobacco Cessation counseled to patient for six minutes Nicotine patch offered.  All the records are reviewed and case discussed with Care Management/Social Worker. Management plans discussed with the patient, family and they are in agreement.  CODE STATUS: Full code  DVT Prophylaxis: SCDs  TOTAL TIME TAKING CARE OF THIS PATIENT: 35 minutes.   POSSIBLE D/C IN 2 to 3 DAYS, DEPENDING ON CLINICAL CONDITION.  Saundra Shelling M.D on 07/04/2017 at 2:15 PM  Between 7am to 6pm - Pager - [phone]  After 6pm go to [email or web address] - password [email or web address] [facility]  Office  [phone]  CC: Primary care physician; [employer]  Note: This dictation was prepared with Diplomatic Services operational officer dictation along with smaller Company secretary. Any transcriptional errors that result from this process are unintentional.

## 2017-07-05 ENCOUNTER — Inpatient Hospital Stay: Payer: Medicare Other

## 2017-07-05 DIAGNOSIS — I509 Heart failure, unspecified: Secondary | ICD-10-CM

## 2017-07-05 DIAGNOSIS — K5900 Constipation, unspecified: Secondary | ICD-10-CM

## 2017-07-05 DIAGNOSIS — Z79899 Other long term (current) drug therapy: Secondary | ICD-10-CM

## 2017-07-05 DIAGNOSIS — K449 Diaphragmatic hernia without obstruction or gangrene: Secondary | ICD-10-CM

## 2017-07-05 DIAGNOSIS — N281 Cyst of kidney, acquired: Secondary | ICD-10-CM

## 2017-07-05 DIAGNOSIS — J9600 Acute respiratory failure, unspecified whether with hypoxia or hypercapnia: Secondary | ICD-10-CM

## 2017-07-05 DIAGNOSIS — C3492 Malignant neoplasm of unspecified part of left bronchus or lung: Secondary | ICD-10-CM

## 2017-07-05 DIAGNOSIS — R21 Rash and other nonspecific skin eruption: Secondary | ICD-10-CM

## 2017-07-05 DIAGNOSIS — C3490 Malignant neoplasm of unspecified part of unspecified bronchus or lung: Secondary | ICD-10-CM

## 2017-07-05 DIAGNOSIS — Z87442 Personal history of urinary calculi: Secondary | ICD-10-CM

## 2017-07-05 DIAGNOSIS — I712 Thoracic aortic aneurysm, without rupture: Secondary | ICD-10-CM

## 2017-07-05 DIAGNOSIS — J449 Chronic obstructive pulmonary disease, unspecified: Secondary | ICD-10-CM

## 2017-07-05 DIAGNOSIS — R0600 Dyspnea, unspecified: Secondary | ICD-10-CM

## 2017-07-05 DIAGNOSIS — I2510 Atherosclerotic heart disease of native coronary artery without angina pectoris: Secondary | ICD-10-CM

## 2017-07-05 LAB — FERRITIN: Ferritin: 288 ng/mL (ref 24–336)

## 2017-07-05 LAB — BASIC METABOLIC PANEL
ANION GAP: 8 (ref 5–15)
BUN: 48 mg/dL — ABNORMAL HIGH (ref 6–20)
CALCIUM: 8.1 mg/dL — AB (ref 8.9–10.3)
CO2: 30 mmol/L (ref 22–32)
Chloride: 98 mmol/L — ABNORMAL LOW (ref 101–111)
Creatinine, Ser: 2.96 mg/dL — ABNORMAL HIGH (ref 0.61–1.24)
GFR calc Af Amer: 22 mL/min — ABNORMAL LOW (ref >60)
GFR, EST NON AFRICAN AMERICAN: 19 mL/min — AB (ref >60)
GLUCOSE: 144 mg/dL — AB (ref 65–99)
Potassium: 4 mmol/L (ref 3.5–5.1)
Sodium: 136 mmol/L (ref 135–145)

## 2017-07-05 LAB — IRON AND TIBC
Iron: 72 ug/dL (ref 45–182)
Saturation Ratios: 35 % (ref 17.9–39.5)
TIBC: 208 ug/dL — ABNORMAL LOW (ref 250–450)
UIBC: 136 ug/dL

## 2017-07-05 LAB — VITAMIN B12: Vitamin B-12: 725 pg/mL (ref 180–914)

## 2017-07-05 LAB — FOLATE: FOLATE: 4.7 ng/mL — AB (ref >5.9)

## 2017-07-05 MED ORDER — SODIUM CHLORIDE 0.9% FLUSH
10.0000 mL | INTRAVENOUS | Status: DC | PRN
  Administered 2017-07-08: 30 mL
  Filled 2017-07-05: qty 40

## 2017-07-05 MED ORDER — PREDNISONE 20 MG PO TABS
40.0000 mg | ORAL_TABLET | Freq: Every day | ORAL | Status: DC
  Administered 2017-07-05 – 2017-07-11 (×7): 40 mg via ORAL
  Filled 2017-07-05 (×8): qty 2

## 2017-07-05 MED ORDER — DEXTROSE 5 % IV SOLN
8.0000 mg/h | INTRAVENOUS | Status: DC
Start: 2017-07-05 — End: 2017-07-10
  Administered 2017-07-05 – 2017-07-10 (×5): 8 mg/h via INTRAVENOUS
  Filled 2017-07-05 (×5): qty 25

## 2017-07-05 MED ORDER — ALBUMIN HUMAN 25 % IV SOLN
25.0000 g | Freq: Three times a day (TID) | INTRAVENOUS | Status: DC
  Administered 2017-07-05 – 2017-07-10 (×14): 25 g via INTRAVENOUS
  Filled 2017-07-05 (×20): qty 100

## 2017-07-05 MED ORDER — DIPHENHYDRAMINE HCL 25 MG PO CAPS
25.0000 mg | ORAL_CAPSULE | Freq: Three times a day (TID) | ORAL | Status: DC | PRN
  Administered 2017-07-05 – 2017-07-10 (×2): 25 mg via ORAL
  Filled 2017-07-05 (×2): qty 1

## 2017-07-05 NOTE — Clinical Social Work Note (Signed)
CSW received a message that patient's son Billy Ortiz, [phone] is requesting short term rehab.  CSW left a message on patient's son's voice mail, and also asked physician to order PT.  CSW awaiting for call back.  [name]. [name], MSW, [place]  07/05/2017 4:38 PM

## 2017-07-05 NOTE — Progress Notes (Signed)
[facility]  ROUNDING NOTE   Subjective:  The patient was last seen by our group on 06/26/17. He was to have had an outpatient followup but is now rehospitalized.  comes back with significant shortness of breath and severe lower extremity edema. His renal function remains significantly worse than his prior baseline in March.   Objective:  Vital signs in last 24 hours:  Temp:  [97.7 F (36.5 C)-98.6 F (37 C)] 98.4 F (36.9 C) (05/07 0832) Pulse Rate:  [30-65] 65 (05/07 0854) Resp:  [17-19] 19 (05/07 0832) BP: (125-172)/(75-77) 172/76 (05/07 0832) SpO2:  [95 %-100 %] 95 % (05/07 1347) Weight:  [87.9 kg (193 lb 12.8 oz)] 87.9 kg (193 lb 12.8 oz) (05/07 0351)  Weight change: 1.179 kg (2 lb 9.6 oz) Filed Weights   07/03/17 0315 07/04/17 0428 07/05/17 0351  Weight: 87.1 kg (192 lb 1.6 oz) 86.7 kg (191 lb 3.2 oz) 87.9 kg (193 lb 12.8 oz)    Intake/Output: I/O last 3 completed shifts: In: 1080 [P.O.:1080] Out: 2200 [Urine:2200]   Intake/Output this shift:  Total I/O In: 720.5 [P.O.:600; I.V.:20.5; IV Piggyback:100] Out: 1220 [Urine:1220]  Physical Exam: General: NAD, sitting up in bed  Head: Normocephalic, atraumatic. Moist oral mucosal membranes  Eyes: Anicteric  Neck: Supple, trachea midline  Lungs:  Basilar rales, normal effort  Heart: S1S2 2/6 SEM  Abdomen:  Soft, nontender, BS present   Extremities: Massive b/l LE edema  Neurologic: Awake, alert, follows commands  Skin: No lesions       Basic Metabolic Panel: Recent Labs  Lab 06/30/17 1822 07/02/17 0904 07/02/17 1045 07/03/17 0510 07/04/17 0530 07/05/17 0726  NA 138  --  139 138 137 136  K 3.5  --  4.0 4.3 4.0 4.0  CL 100*  --  99* 100* 98* 98*  CO2 30  --  _0 GLUCOSE 91  --  68 225* 184* 144*  BUN 35*  --  41* 47* 52* 48*  CREATININE 2.47* THIS TEST WAS ORDERED IN ERROR AND HAS BEEN CREDITED. 2.62* 3.13* 3.23* 2.96*  CALCIUM 8.2*  --  8.6* 8.1* 8.2* 8.1*    Liver Function  Tests: Recent Labs  Lab 06/30/17 1831  AST 28  ALT 38  ALKPHOS 69  BILITOT 0.6  PROT 6.2*  ALBUMIN 2.7*   No results for input(s): LIPASE, AMYLASE in the last 168 hours. No results for input(s): AMMONIA in the last 168 hours.  CBC: Recent Labs  Lab 06/30/17 1822 07/02/17 1045 07/03/17 0510  WBC 9.7 10.4 5.8  NEUTROABS 7.1*  --   --   HGB 10.6* 11.3* 9.2*  HCT 32.7* 34.1* 27.4*  MCV 71.6* 71.6* 71.0*  PLT 294 233 193    Cardiac Enzymes: Recent Labs  Lab 06/30/17 1831 06/30/17 2045 07/02/17 1045  TROPONINI 0.03* 0.03* 0.05*    BNP: Invalid input(s): POCBNP  CBG: No results for input(s): GLUCAP in the last 168 hours.  Microbiology: Results for orders placed or performed during the hospital encounter of 06/03/17  Culture, bal-quantitative     Status: Abnormal   Collection Time: 06/03/17  2:48 PM  Result Value Ref Range Status   Specimen Description   Final    BRONCHIAL ALVEOLAR LAVAGE Performed at [facility], [address] [place], [place] [address]    Special Requests   Final    NONE Performed at [facility], [address] [place], [place] [address]    Gram Stain  Final    RARE WBC PRESENT,BOTH PMN AND MONONUCLEAR RARE SQUAMOUS EPITHELIAL CELLS PRESENT NO ORGANISMS SEEN Performed at [facility], [address] [place], [place] [address]    Culture 2,000 COLONIES/mL STAPHYLOCOCCUS AUREUS (A)  Final   Report Status 06/06/2017 FINAL  Final   Organism ID, Bacteria STAPHYLOCOCCUS AUREUS (A)  Final      Susceptibility   Staphylococcus aureus - MIC*    CIPROFLOXACIN <=0.5 SENSITIVE Sensitive     ERYTHROMYCIN <=0.25 SENSITIVE Sensitive     GENTAMICIN <=0.5 SENSITIVE Sensitive     OXACILLIN 0.5 SENSITIVE Sensitive     TETRACYCLINE <=1 SENSITIVE Sensitive     VANCOMYCIN 1 SENSITIVE Sensitive     TRIMETH/SULFA <=10 SENSITIVE Sensitive     CLINDAMYCIN <=0.25 SENSITIVE Sensitive     RIFAMPIN <=0.5 SENSITIVE Sensitive      Inducible Clindamycin NEGATIVE Sensitive     * 2,000 COLONIES/mL STAPHYLOCOCCUS AUREUS  Acid Fast Smear (AFB)     Status: None   Collection Time: 06/03/17  2:48 PM  Result Value Ref Range Status   AFB Specimen Processing Concentration  Final   Acid Fast Smear Negative  Final    Comment: (NOTE) Performed At: [facility] [address] [place], [place] [id number] Rush Farmer MD EY:[id number]    Source (AFB) BRONCHIAL ALVEOLAR LAVAGE  Final    Comment: Performed at [facility], [address] [place], [place] [address]    Coagulation Studies: No results for input(s): LABPROT, INR in the last 72 hours.  Urinalysis: No results for input(s): COLORURINE, LABSPEC, PHURINE, GLUCOSEU, HGBUR, BILIRUBINUR, KETONESUR, PROTEINUR, UROBILINOGEN, NITRITE, LEUKOCYTESUR in the last 72 hours.  Invalid input(s): APPERANCEUR    Imaging: No results found.   Medications:   . albumin human Stopped (07/05/17 1501)  . furosemide (LASIX) infusion 8 mg/hr (07/05/17 1227)   . atorvastatin  40 mg Oral q1800  . docusate sodium  100 mg Oral Daily  . fluticasone furoate-vilanterol  1 puff Inhalation Daily  . heparin  5,000 Units Subcutaneous Q8H  . hydrALAZINE  25 mg Oral BID  . ipratropium-albuterol  3 mL Nebulization TID  . isosorbide mononitrate  60 mg Oral Daily  . pantoprazole  40 mg Oral Daily  . predniSONE  40 mg Oral Q breakfast  . sodium chloride flush  3 mL Intravenous Q12H   acetaminophen **OR** acetaminophen, albuterol, guaiFENesin, hydrALAZINE, nitroGLYCERIN, ondansetron **OR** ondansetron (ZOFRAN) IV, polyethylene glycol, sodium chloride flush  Assessment/ Plan:  [name] is a 77 y.o. black male with lung cancer, hypertension, GERD, hyperlipidemia, COPD, who was admitted to [facility] on 07/02/17.    1. 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  Chronic  kidney disease secondary to hypertension and bilateral renal cysts.  Negative ANA/ANCA/GBM 05/2017.   - we previously saw the patient during his last admission.  He appears have worsening renal function again.  His primary issue however appears to be significant lower extremity swelling.  At the moment we will go ahead and start the patient on a Lasix drip along with albumin infusion.  We will repeat renal ultrasound to make sure there is no underlying obstruction now.  2. Lower extremity edema.  Quite significant on exam.  Very troublesome to the patient.  Start the patient on Lasix drip at 8 mg per hour and also had albumin 25 g IV every 8 hours.  We will  need to follow renal function quite closely when he is on Lasix.   LOS: 3 Manda Holstad 5/7/20193:19 PM

## 2017-07-05 NOTE — Care Management Important Message (Signed)
Copy of signed IM left in patient's room.    
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
## 2017-07-05 NOTE — Plan of Care (Signed)
  Problem: Clinical Measurements: Goal: Respiratory complications will improve Outcome: Progressing  Pt able to lay in bed this afternoon, on room air without distress. Maintains oxygen saturations >94%  Problem: Activity: Goal: Risk for activity intolerance will decrease Outcome: Progressing   Problem: Education: Goal: Ability to demonstrate management of disease process will improve Outcome: Progressing  Pt able to verbalize understanding of low sodium diet, and use of furosemide.

## 2017-07-05 NOTE — Progress Notes (Signed)
[place] [facility] [place] NAME: Billy Ortiz    MR#:  [id number]  DATE OF BIRTH:  1940/05/15  SUBJECTIVE:  Patient seen and evaluated today Has decreased wheezing and shortness of breath On oxygen via nasal cannula as needed Still has swelling in both legs which has not responded much to torsemide No headaches, dizziness And active cigarette smoker    REVIEW OF SYSTEMS:    ROS  CONSTITUTIONAL: No documented fever. No fatigue, weakness. No weight gain, no weight loss.  EYES: No blurry or double vision.  ENT: No tinnitus. No postnasal drip. No redness of the oropharynx.  RESPIRATORY: Occasional cough, Has decreased wheeze, no hemoptysis. Has dyspnea.  CARDIOVASCULAR: No chest pain. No orthopnea. No palpitations. No syncope.  GASTROINTESTINAL: No nausea, no vomiting or diarrhea. No abdominal pain. No melena or hematochezia.  GENITOURINARY: No dysuria or hematuria.  ENDOCRINE: No polyuria or nocturia. No heat or cold intolerance.  HEMATOLOGY: No anemia. No bruising. No bleeding.  INTEGUMENTARY: No rashes. No lesions.  MUSCULOSKELETAL: No arthritis. No swelling. No gout.  Swelling in legs NEUROLOGIC: No numbness, tingling, or ataxia. No seizure-type activity.  PSYCHIATRIC: No anxiety. No insomnia. No ADD.   DRUG ALLERGIES:   Allergies  Allergen Reactions  . Tramadol Hives  . Lisinopril     Other reaction(s): Unknown Other reaction(s): Unknown    VITALS:  Blood pressure (!) 172/76, pulse 65, temperature 98.4 F (36.9 C), temperature source Oral, resp. rate 19, height 6\' 4"  (1.93 m), weight 87.9 kg (193 lb 12.8 oz), SpO2 98 %.  PHYSICAL EXAMINATION:   Physical Exam  GENERAL:  77 y.o.-year-old patient lying in the bed on oxygen via nasal canula EYES: Pupils equal, round, reactive to light and accommodation. No scleral icterus. Extraocular muscles intact.  HEENT: Head atraumatic, normocephalic. Oropharynx and nasopharynx clear.   NECK:  Supple, no jugular venous distention. No thyroid enlargement, no tenderness.  LUNGS: Improved breath sounds bilaterally, Bilateral wheezing decreased. No use of accessory muscles of respiration.  CARDIOVASCULAR: S1, S2 normal. No murmurs, rubs, or gallops.  ABDOMEN: Soft, nontender, nondistended. Bowel sounds present. No organomegaly or mass.  EXTREMITIES: No cyanosis, clubbing  Has edema b/l.    NEUROLOGIC: Cranial nerves II through XII are intact. No focal Motor or sensory deficits b/l.   PSYCHIATRIC: The patient is alert and oriented x 3.  SKIN: No obvious rash, lesion, or ulcer.   LABORATORY PANEL:   CBC Recent Labs  Lab 07/03/17 0510  WBC 5.8  HGB 9.2*  HCT 27.4*  PLT 193   ------------------------------------------------------------------------------------------------------------------ Chemistries  Recent Labs  Lab 06/30/17 1831  07/05/17 0726  NA  --    < > 136  K  --    < > 4.0  CL  --    < > 98*  CO2  --    < > 30  GLUCOSE  --    < > 144*  BUN  --    < > 48*  CREATININE  --    < > 2.96*  CALCIUM  --    < > 8.1*  AST 28  --   --   ALT 38  --   --   ALKPHOS 69  --   --   BILITOT 0.6  --   --    < > = values in this interval not displayed.   ------------------------------------------------------------------------------------------------------------------  Cardiac Enzymes Recent Labs  Lab 07/02/17 1045  TROPONINI 0.05*   ------------------------------------------------------------------------------------------------------------------  RADIOLOGY:  No results found.   ASSESSMENT AND PLAN:   77 year old male patient with history of chronic kidney disease stage IV, squamous cell lung cancer stage III, COPD currently under service for COPD exacerbation and respiratory distress.  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  All the records are reviewed and case discussed with Care Management/Social Worker. Management plans discussed with the patient, family and they are in agreement.  CODE STATUS: Full code  DVT Prophylaxis: SCDs  TOTAL TIME TAKING CARE OF THIS PATIENT: 35 minutes.   POSSIBLE D/C IN 2 to 3 DAYS, DEPENDING ON CLINICAL CONDITION.  Saundra Shelling M.D on 07/05/2017 at 1:15 PM  Between 7am to 6pm - Pager - [phone]  After 6pm go to [email or web address] - password [email or web address] [facility]  Office  [phone]  CC: Primary care physician; [employer]  Note: This dictation was prepared with Diplomatic Services operational officer dictation along with smaller Company secretary. Any transcriptional errors that result from this process are unintentional.

## 2017-07-06 ENCOUNTER — Inpatient Hospital Stay: Payer: Medicare Other

## 2017-07-06 ENCOUNTER — Ambulatory Visit: Payer: Medicare Other

## 2017-07-06 DIAGNOSIS — D631 Anemia in chronic kidney disease: Secondary | ICD-10-CM

## 2017-07-06 DIAGNOSIS — J9621 Acute and chronic respiratory failure with hypoxia: Secondary | ICD-10-CM

## 2017-07-06 DIAGNOSIS — M5136 Other intervertebral disc degeneration, lumbar region: Secondary | ICD-10-CM

## 2017-07-06 DIAGNOSIS — E538 Deficiency of other specified B group vitamins: Secondary | ICD-10-CM

## 2017-07-06 DIAGNOSIS — N179 Acute kidney failure, unspecified: Secondary | ICD-10-CM

## 2017-07-06 LAB — BASIC METABOLIC PANEL
ANION GAP: 10 (ref 5–15)
BUN: 53 mg/dL — ABNORMAL HIGH (ref 6–20)
CO2: 30 mmol/L (ref 22–32)
Calcium: 8.8 mg/dL — ABNORMAL LOW (ref 8.9–10.3)
Chloride: 96 mmol/L — ABNORMAL LOW (ref 101–111)
Creatinine, Ser: 3.24 mg/dL — ABNORMAL HIGH (ref 0.61–1.24)
GFR, EST AFRICAN AMERICAN: 20 mL/min — AB (ref >60)
GFR, EST NON AFRICAN AMERICAN: 17 mL/min — AB (ref >60)
Glucose, Bld: 151 mg/dL — ABNORMAL HIGH (ref 65–99)
POTASSIUM: 3.8 mmol/L (ref 3.5–5.1)
SODIUM: 136 mmol/L (ref 135–145)

## 2017-07-06 MED ORDER — IPRATROPIUM-ALBUTEROL 0.5-2.5 (3) MG/3ML IN SOLN
3.0000 mL | Freq: Two times a day (BID) | RESPIRATORY_TRACT | Status: DC
  Administered 2017-07-06 – 2017-07-08 (×4): 3 mL via RESPIRATORY_TRACT
  Filled 2017-07-06 (×4): qty 3

## 2017-07-06 MED ORDER — GABAPENTIN 300 MG PO CAPS
300.0000 mg | ORAL_CAPSULE | Freq: Two times a day (BID) | ORAL | Status: DC
  Administered 2017-07-06 – 2017-07-11 (×11): 300 mg via ORAL
  Filled 2017-07-06 (×11): qty 1

## 2017-07-06 MED ORDER — FOLIC ACID 1 MG PO TABS
1.0000 mg | ORAL_TABLET | Freq: Every day | ORAL | Status: DC
  Administered 2017-07-06 – 2017-07-11 (×6): 1 mg via ORAL
  Filled 2017-07-06 (×6): qty 1

## 2017-07-06 MED ORDER — AMLODIPINE BESYLATE 5 MG PO TABS
2.5000 mg | ORAL_TABLET | Freq: Every day | ORAL | Status: DC
  Administered 2017-07-06 – 2017-07-08 (×3): 2.5 mg via ORAL
  Filled 2017-07-06 (×3): qty 1

## 2017-07-06 NOTE — Progress Notes (Signed)
[place] [facility] [place] NAME: Billy Ortiz    MR#:  [id number]  DATE OF BIRTH:  [birth year]  SUBJECTIVE:  Patient seen and evaluated today Has been weaned off oxygen No shortness of breath Swelling in the legs is improving No headaches, dizziness And active cigarette smoker    REVIEW OF SYSTEMS:    ROS  CONSTITUTIONAL: No documented fever. No fatigue, weakness. No weight gain, no weight loss.  EYES: No blurry or double vision.  ENT: No tinnitus. No postnasal drip. No redness of the oropharynx.  RESPIRATORY: Occasional cough, Has decreased wheeze, no hemoptysis. Has dyspnea.  CARDIOVASCULAR: No chest pain. No orthopnea. No palpitations. No syncope.  GASTROINTESTINAL: No nausea, no vomiting or diarrhea. No abdominal pain. No melena or hematochezia.  GENITOURINARY: No dysuria or hematuria.  ENDOCRINE: No polyuria or nocturia. No heat or cold intolerance.  HEMATOLOGY: No anemia. No bruising. No bleeding.  INTEGUMENTARY: No rashes. No lesions.  MUSCULOSKELETAL: No arthritis. No swelling. No gout.  Swelling in legs decreased NEUROLOGIC: No numbness, tingling, or ataxia. No seizure-type activity.  PSYCHIATRIC: No anxiety. No insomnia. No ADD.   DRUG ALLERGIES:   Allergies  Allergen Reactions  . Tramadol Hives  . Lisinopril     Other reaction(s): Unknown Other reaction(s): Unknown    VITALS:  Blood pressure (!) 164/75, pulse 68, temperature 98.3 F (36.8 C), temperature source Oral, resp. rate 14, height 6\' 4"  (1.93 m), weight 87.9 kg (193 lb 12.8 oz), SpO2 96 %.  PHYSICAL EXAMINATION:   Physical Exam  GENERAL:  77 y.o.-year-old patient lying in the bed on oxygen via nasal canula EYES: Pupils equal, round, reactive to light and accommodation. No scleral icterus. Extraocular muscles intact.  HEENT: Head atraumatic, normocephalic. Oropharynx and nasopharynx clear.  NECK:  Supple, no jugular venous distention. No thyroid enlargement,  no tenderness.  LUNGS: Improved breath sounds bilaterally, Bilateral wheezing decreased. No use of accessory muscles of respiration.  CARDIOVASCULAR: S1, S2 normal. No murmurs, rubs, or gallops.  ABDOMEN: Soft, nontender, nondistended. Bowel sounds present. No organomegaly or mass.  EXTREMITIES: No cyanosis, clubbing  Has edema b/l improving NEUROLOGIC: Cranial nerves II through XII are intact. No focal Motor or sensory deficits b/l.   PSYCHIATRIC: The patient is alert and oriented x 3.  SKIN: No obvious rash, lesion, or ulcer.   LABORATORY PANEL:   CBC Recent Labs  Lab 07/03/17 0510  WBC 5.8  HGB 9.2*  HCT 27.4*  PLT 193   ------------------------------------------------------------------------------------------------------------------ Chemistries  Recent Labs  Lab 06/30/17 1831  07/06/17 0549  NA  --    < > 136  K  --    < > 3.8  CL  --    < > 96*  CO2  --    < > 30  GLUCOSE  --    < > 151*  BUN  --    < > 53*  CREATININE  --    < > 3.24*  CALCIUM  --    < > 8.8*  AST 28  --   --   ALT 38  --   --   ALKPHOS 69  --   --   BILITOT 0.6  --   --    < > = values in this interval not displayed.   ------------------------------------------------------------------------------------------------------------------  Cardiac Enzymes Recent Labs  Lab 07/02/17 1045  TROPONINI 0.05*   ------------------------------------------------------------------------------------------------------------------  RADIOLOGY:  US Renal  Result Date: 07/05/2017 CLINICAL DATA:  77 year old  male with acute renal failure. Subsequent encounter. EXAM: RENAL / URINARY TRACT ULTRASOUND COMPLETE COMPARISON:  06/22/2017 ultrasound.  05/10/2017 PET-CT. FINDINGS: Right Kidney: Length: 10.2 cm. Increased echogenicity. No hydronephrosis. Mid to upper pole 11 x 10.8 x 9 cm cyst. Lower pole 4.2 x 4.3 x 4.4 cm cyst. Left Kidney: Length: 10.4 cm. Echogenicity within normal limits. No hydronephrosis. Lower pole  cyst measuring up to 3.4 cm, 9 mm and 1.5 cm. Bladder: Appears normal for degree of bladder distention. Bilateral ureteral jets noted. IMPRESSION: No hydronephrosis. Bilateral cysts larger on the right. Increased echogenicity right renal parenchyma. Etiology indeterminate. Electronically Signed   By: Genia Del M.D.   On: 07/05/2017 17:24     ASSESSMENT AND PLAN:   77 year old male patient with history of chronic kidney disease stage IV, squamous cell lung cancer stage III, COPD currently under service for COPD exacerbation and respiratory distress.  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  All the records are reviewed and case discussed with Care Management/Social Worker. Management plans discussed with the patient, family and they are in agreement.  CODE STATUS: Full code  DVT Prophylaxis: SCDs  TOTAL TIME TAKING CARE OF THIS PATIENT: 34 minutes.   POSSIBLE D/C IN 2 to 3 DAYS, DEPENDING ON CLINICAL CONDITION.  Saundra Shelling M.D on 07/06/2017 at  3:49 PM  Between 7am to 6pm - Pager - [phone]  After 6pm go to [email or web address] - password [email or web address] [facility]  Office  [phone]  CC: Primary care physician; [employer]  Note: This dictation was prepared with Diplomatic Services operational officer dictation along with smaller Company secretary. Any transcriptional errors that result from this process are unintentional.

## 2017-07-06 NOTE — NC FL2 (Signed)
[place] LEVEL OF CARE SCREENING TOOL     IDENTIFICATION  Patient Name: Billy Ortiz Birthdate: January 20, 1941 Sex: male Admission Date (Current Location): 07/02/2017  [place] and [place] Number:  Engineering geologist and Address:  [facility], [address] [place], [place] [address]      Provider Number: [id number]  Attending Physician Name and Address:  Saundra Shelling, MD  Relative Name and Phone Number:  Refael, Fulop Son   [phone] or JAQUAVIUS, HUDLER Sister [phone]     Current Level of Care: Hospital Recommended Level of Care: [place] Prior Approval Number:    Date Approved/Denied:   PASRR Number: [id number]  Discharge Plan: SNF    Current Diagnoses: Patient Active Problem List   Diagnosis Date Noted  . Acute renal failure ([place])   . Folate deficiency   . Malignant neoplasm of lung ([place])   . Dyspnea   . COPD (chronic obstructive pulmonary disease) ([place]) 07/02/2017  . Acute renal failure superimposed on stage 3 chronic kidney disease ([place]) 06/20/2017  . Hemoptysis 06/20/2017  . Elevated troponin 06/20/2017  . Squamous cell lung cancer, left ([place]) 06/10/2017  . Personal history of prostate cancer 05/24/2017  . (HFpEF) heart failure with preserved ejection fraction ([place]) 04/11/2017  . Claudication of right lower extremity ([place]) 04/11/2017  . Drug-induced constipation 04/11/2017  . Rash 04/11/2017  . History of nephrolithiasis 03/03/2017  . Renal cyst, acquired 03/03/2017  . Acute blood loss anemia 06/11/2016  . 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 Stable angina ([place]) 04/07/2016  . CKD (chronic kidney disease) stage 3, GFR 30-59 ml/min ([facility]) 02/25/2016  . Inguinal hernia 01/30/2016  . Lumbosacral radiculitis 01/30/2016  . 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 Benign essential HTN 03/06/2015  . Bilateral carotid artery stenosis 03/06/2015  . Hyperlipidemia, unspecified 03/06/2015  . History of  recurrent pneumonia 07/17/2014  . 1st degree AV block 07/08/2014  . CAD (coronary artery disease) 07/08/2014  . Pseudophakia of right eye 07/08/2014  . DDD (degenerative disc disease), lumbar 07/08/2014  . Hypoglycemia 07/08/2014  . Pneumonia 07/08/2014  . Pulmonary nodules 07/08/2014  . Thoracic aortic aneurysm without rupture ([place]) 07/08/2014  . Tobacco use 07/08/2014  . Disease of lung 10/24/2013  . 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 Moderate COPD (chronic obstructive pulmonary disease) ([place]) 10/24/2013  . Malignant neoplasm of prostate ([place]) 09/23/2008  . Nicotine dependence, uncomplicated 98/92/1194  . Gastro-esophageal reflux disease without esophagitis 03/02/2006    Orientation RESPIRATION BLADDER Height & Weight     Self, Time, Situation, Place  O2(2L) Continent Weight: 193 lb 12.8 oz (87.9 kg) Height:  6\' 4"  (193 cm)  BEHAVIORAL SYMPTOMS/MOOD NEUROLOGICAL BOWEL NUTRITION STATUS      Continent Diet(2G sodium diet)  AMBULATORY STATUS COMMUNICATION OF NEEDS Skin   Limited Assist   Normal                       Personal Care Assistance Level of Assistance  Bathing, Dressing Bathing Assistance: Limited assistance   Dressing Assistance: Limited assistance     Functional Limitations Info  Sight, Hearing, Speech Sight Info: Adequate Hearing Info: Adequate Speech Info: Adequate    SPECIAL CARE FACTORS FREQUENCY  PT (By licensed PT), OT (By licensed OT)     PT Frequency: 5x a week OT Frequency: 5x a week            Contractures Contractures Info: Not present  Additional Factors Info  Code Status, Allergies Code Status Info: DNR Allergies Info: TRAMADOL, LISINOPRIL           Current Medications (07/06/2017):  This is the current hospital active medication list Current Facility-Administered Medications  Medication Dose Route Frequency Provider Last Rate Last Dose  . acetaminophen (TYLENOL) tablet 650 mg  650 mg Oral Q6H PRN Hillary Bow, MD   650 mg at 07/03/17 2141   Or  . acetaminophen (TYLENOL) suppository 650 mg  650 mg Rectal Q6H PRN Sudini, Alveta Heimlich, MD      . albumin human 25 % solution 25 g  25 g Intravenous Q8H Lateef, Munsoor, MD   Stopped at 07/06/17 1312  . albuterol (PROVENTIL) (2.5 MG/3ML) 0.083% nebulizer solution 2.5 mg  2.5 mg Nebulization Q2H PRN Hillary Bow, MD   2.5 mg at 07/04/17 2109  . amLODipine (NORVASC) tablet 2.5 mg  2.5 mg Oral Daily Pyreddy, Pavan, MD   2.5 mg at 07/06/17 1458  . atorvastatin (LIPITOR) tablet 40 mg  40 mg Oral q1800 Hillary Bow, MD   40 mg at 07/06/17 1723  . diphenhydrAMINE (BENADRYL) capsule 25 mg  25 mg Oral Q8H PRN Saundra Shelling, MD   25 mg at 07/05/17 1816  . docusate sodium (COLACE) capsule 100 mg  100 mg Oral Daily Hillary Bow, MD   100 mg at 07/06/17 0928  . fluticasone furoate-vilanterol (BREO ELLIPTA) 100-25 MCG/INH 1 puff  1 puff Inhalation Daily Hillary Bow, MD   1 puff at 07/06/17 [phone]  . folic acid (FOLVITE) tablet 1 mg  1 mg Oral Daily Earlie Server, MD   1 mg at 07/06/17 1723  . furosemide (LASIX) 250 mg in dextrose 5 % 250 mL (1 mg/mL) infusion  8 mg/hr Intravenous Continuous Lateef, Munsoor, MD 8 mL/hr at 07/06/17 1500 8 mg/hr at 07/06/17 1500  . gabapentin (NEURONTIN) capsule 300 mg  300 mg Oral BID Saundra Shelling, MD   300 mg at 07/06/17 1458  . guaiFENesin (ROBITUSSIN) 100 MG/5ML solution 100 mg  5 mL Oral Q4H PRN Sudini, Alveta Heimlich, MD      . heparin injection 5,000 Units  5,000 Units Subcutaneous Q8H Hillary Bow, MD   5,000 Units at 07/06/17 1458  . hydrALAZINE (APRESOLINE) injection 10 mg  10 mg Intravenous Q6H PRN Hillary Bow, MD   10 mg at 07/03/17 1010  . hydrALAZINE (APRESOLINE) tablet 25 mg  25 mg Oral BID Saundra Shelling, MD   25 mg at 07/06/17 0928  . ipratropium-albuterol (DUONEB) 0.5-2.5 (3) MG/3ML nebulizer solution 3 mL  3 mL Nebulization BID Pyreddy, Pavan, MD      . isosorbide mononitrate (IMDUR) 24 hr tablet 60 mg  60 mg Oral Daily  Hillary Bow, MD   60 mg at 07/06/17 0928  . nitroGLYCERIN (NITROSTAT) SL tablet 0.4 mg  0.4 mg Sublingual Q5 min PRN Sudini, Alveta Heimlich, MD      . ondansetron (ZOFRAN) tablet 4 mg  4 mg Oral Q6H PRN Sudini, Alveta Heimlich, MD       Or  . ondansetron (ZOFRAN) injection 4 mg  4 mg Intravenous Q6H PRN Sudini, Srikar, MD      . pantoprazole (PROTONIX) EC tablet 40 mg  40 mg Oral Daily Hillary Bow, MD   40 mg at 07/06/17 0928  . polyethylene glycol (MIRALAX / GLYCOLAX) packet 17 g  17 g Oral Daily PRN Sudini, Srikar, MD      . predniSONE (DELTASONE) tablet 40 mg  40 mg Oral Q  breakfast Saundra Shelling, MD   40 mg at 07/06/17 0927  . sodium chloride flush (NS) 0.9 % injection 10-40 mL  10-40 mL Intracatheter PRN Pyreddy, Pavan, MD      . sodium chloride flush (NS) 0.9 % injection 3 mL  3 mL Intravenous Q12H Hillary Bow, MD   3 mL at 07/06/17 5830   Facility-Administered Medications Ordered in Other Encounters  Medication Dose Route Frequency Provider Last Rate Last Dose  . ceFAZolin (ANCEF) IVPB 2g/100 mL premix  2 g Intravenous Once Stegmayer, Janalyn Harder, PA-C         Discharge Medications: Please see discharge summary for a list of discharge medications.  Relevant Imaging Results:  Relevant Lab Results:   Additional Information SSN [id number]  Ross Ludwig, [place]
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
## 2017-07-06 NOTE — Clinical Social Work Note (Signed)
CSW spoke with patient and he is agreeable to going to SNF for short term rehab.  CSW was given permission to begin bed search in [place].  Patient prefers to go to [facility] [place].  Formal assessment to follow at a later time.  [name]. [name], MSW, [place]  07/06/2017 6:31 PM

## 2017-07-06 NOTE — Progress Notes (Signed)
[facility]  ROUNDING NOTE   Subjective:  Urine output significantly improved to 3.6 L over the preceding 24 hours. He states that his lower extremity edema is less tight.   Objective:  Vital signs in last 24 hours:  Temp:  [98.3 F (36.8 C)-98.8 F (37.1 C)] 98.3 F (36.8 C) (05/08 0742) Pulse Rate:  [32-68] 68 (05/08 0742) Resp:  [14-18] 14 (05/08 0742) BP: (145-165)/(70-78) 164/75 (05/08 0742) SpO2:  [92 %-100 %] 96 % (05/08 0927)  Weight change:  Filed Weights   07/03/17 0315 07/04/17 0428 07/05/17 0351  Weight: 87.1 kg (192 lb 1.6 oz) 86.7 kg (191 lb 3.2 oz) 87.9 kg (193 lb 12.8 oz)    Intake/Output: I/O last 3 completed shifts: In: 720.5 [P.O.:600; I.V.:20.5; IV Piggyback:100] Out: 3945 [Urine:3945]   Intake/Output this shift:  Total I/O In: 600 [P.O.:600] Out: 1100 [Urine:1100]  Physical Exam: General: NAD, sitting up in bed  Head: Normocephalic, atraumatic. Moist oral mucosal membranes  Eyes: Anicteric  Neck: Supple, trachea midline  Lungs:  Basilar rales, normal effort  Heart: S1S2 2/6 SEM  Abdomen:  Soft, nontender, BS present   Extremities: Massive b/l LE edema, less tight  Neurologic: Awake, alert, follows commands  Skin: No lesions       Basic Metabolic Panel: Recent Labs  Lab 07/02/17 1045 07/03/17 0510 07/04/17 0530 07/05/17 0726 07/06/17 0549  NA 139 138 137 136 136  K 4.0 4.3 4.0 4.0 3.8  CL 99* 100* 98* 98* 96*  CO2 _0 GLUCOSE 68 225* 184* 144* 151*  BUN 41* 47* 52* 48* 53*  CREATININE 2.62* 3.13* 3.23* 2.96* 3.24*  CALCIUM 8.6* 8.1* 8.2* 8.1* 8.8*    Liver Function Tests: Recent Labs  Lab 06/30/17 1831  AST 28  ALT 38  ALKPHOS 69  BILITOT 0.6  PROT 6.2*  ALBUMIN 2.7*   No results for input(s): LIPASE, AMYLASE in the last 168 hours. No results for input(s): AMMONIA in the last 168 hours.  CBC: Recent Labs  Lab 06/30/17 1822 07/02/17 1045 07/03/17 0510  WBC 9.7 10.4 5.8  NEUTROABS 7.1*   --   --   HGB 10.6* 11.3* 9.2*  HCT 32.7* 34.1* 27.4*  MCV 71.6* 71.6* 71.0*  PLT 294 233 193    Cardiac Enzymes: Recent Labs  Lab 06/30/17 1831 06/30/17 2045 07/02/17 1045  TROPONINI 0.03* 0.03* 0.05*    BNP: Invalid input(s): POCBNP  CBG: No results for input(s): GLUCAP in the last 168 hours.  Microbiology: Results for orders placed or performed during the hospital encounter of 06/03/17  Culture, bal-quantitative     Status: Abnormal   Collection Time: 06/03/17  2:48 PM  Result Value Ref Range Status   Specimen Description   Final    BRONCHIAL ALVEOLAR LAVAGE Performed at [facility], [address] [place], [place] [address]    Special Requests   Final    NONE Performed at [facility], [address] [place], [place] [address]    Gram Stain   Final    RARE WBC PRESENT,BOTH PMN AND MONONUCLEAR RARE SQUAMOUS EPITHELIAL CELLS PRESENT NO ORGANISMS SEEN Performed at [facility], [place] [address], [place], [place] [address]    Culture 2,000 COLONIES/mL STAPHYLOCOCCUS AUREUS (A)  Final   Report Status 06/06/2017 FINAL  Final   Organism ID, Bacteria STAPHYLOCOCCUS AUREUS (A)  Final      Susceptibility   Staphylococcus aureus - MIC*  CIPROFLOXACIN <=0.5 SENSITIVE Sensitive     ERYTHROMYCIN <=0.25 SENSITIVE Sensitive     GENTAMICIN <=0.5 SENSITIVE Sensitive     OXACILLIN 0.5 SENSITIVE Sensitive     TETRACYCLINE <=1 SENSITIVE Sensitive     VANCOMYCIN 1 SENSITIVE Sensitive     TRIMETH/SULFA <=10 SENSITIVE Sensitive     CLINDAMYCIN <=0.25 SENSITIVE Sensitive     RIFAMPIN <=0.5 SENSITIVE Sensitive     Inducible Clindamycin NEGATIVE Sensitive     * 2,000 COLONIES/mL STAPHYLOCOCCUS AUREUS  Acid Fast Smear (AFB)     Status: None   Collection Time: 06/03/17  2:48 PM  Result Value Ref Range Status   AFB Specimen Processing Concentration  Final   Acid Fast Smear Negative  Final    Comment: (NOTE) Performed At: [facility] [address] [place], [place] [id number] Rush Farmer MD LS:[id number]    Source (AFB) BRONCHIAL ALVEOLAR LAVAGE  Final    Comment: Performed at [facility], [address] [place], [place] [address]    Coagulation Studies: No results for input(s): LABPROT, INR in the last 72 hours.  Urinalysis: No results for input(s): COLORURINE, LABSPEC, PHURINE, GLUCOSEU, HGBUR, BILIRUBINUR, KETONESUR, PROTEINUR, UROBILINOGEN, NITRITE, LEUKOCYTESUR in the last 72 hours.  Invalid input(s): APPERANCEUR    Imaging: US Renal  Result Date: 07/05/2017 CLINICAL DATA:  77 year old male with acute renal failure. Subsequent encounter. EXAM: RENAL / URINARY TRACT ULTRASOUND COMPLETE COMPARISON:  06/22/2017 ultrasound.  05/10/2017 PET-CT. FINDINGS: Right Kidney: Length: 10.2 cm. Increased echogenicity. No hydronephrosis. Mid to upper pole 11 x 10.8 x 9 cm cyst. Lower pole 4.2 x 4.3 x 4.4 cm cyst. Left Kidney: Length: 10.4 cm. Echogenicity within normal limits. No hydronephrosis. Lower pole cyst measuring up to 3.4 cm, 9 mm and 1.5 cm. Bladder: Appears normal for degree of bladder distention. Bilateral ureteral jets noted. IMPRESSION: No hydronephrosis. Bilateral cysts larger on the right. Increased echogenicity right renal parenchyma. Etiology indeterminate. Electronically Signed   By: Genia Del M.D.   On: 07/05/2017 17:24     Medications:   . albumin human 0 g (07/05/17 1501)  . furosemide (LASIX) infusion 8 mg/hr (07/05/17 1227)   . atorvastatin  40 mg Oral q1800  . docusate sodium  100 mg Oral Daily  . fluticasone furoate-vilanterol  1 puff Inhalation Daily  . heparin  5,000 Units Subcutaneous Q8H  . hydrALAZINE  25 mg Oral BID  . ipratropium-albuterol  3 mL Nebulization BID  . isosorbide mononitrate  60 mg Oral Daily  . pantoprazole  40 mg Oral Daily  . predniSONE  40 mg Oral Q breakfast  . sodium chloride flush  3 mL Intravenous Q12H   acetaminophen **OR** acetaminophen, albuterol,  diphenhydrAMINE, guaiFENesin, hydrALAZINE, nitroGLYCERIN, ondansetron **OR** ondansetron (ZOFRAN) IV, polyethylene glycol, sodium chloride flush  Assessment/ Plan:  Mr. REDA GETTIS is a 77 y.o. black male with lung cancer, hypertension, GERD, hyperlipidemia, COPD, who was admitted to [facility] on 07/02/17.    1. 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  Chronic kidney disease secondary to hypertension and bilateral renal cysts.  Negative ANA/ANCA/GBM 05/2017.   -Renal function has worsened a bit but this is to be expected with the use of Lasix drip.  Urine output was 3.6 L over the preceding 24 hours.  Edema in the legs appears to be a bit less tight.  Continue Lasix drip for now and monitor renal function daily.  2. Lower extremity edema.  Still has quite significant edema though edema is less tight in the lower extremities.  Maintain the patient on Lasix drip along with albumin combination for now.   LOS: [place], Riyad Keena 5/8/20191:11 PM

## 2017-07-06 NOTE — Clinical Social Work Note (Signed)
Clinical Social Work Assessment  Patient Details  Name: Billy Ortiz MRN: [id number] Date of Birth: April 24, 1940  Date of referral:  07/06/17               Reason for consult:  Facility Placement                Permission sought to share information with:  Family Supports, Customer service manager Permission granted to share information::  Yes, Verbal Permission Granted  Name::     Loring, Liskey Son   [phone] or KORBYN, CHOPIN [phone]   Agency::  SNF admissions  Relationship::     Contact Information:     Housing/Transportation Living arrangements for the past 2 months:  Single Family Home Source of Information:  Patient Patient Interpreter Needed:  None Criminal Activity/Legal Involvement Pertinent to Current Situation/Hospitalization:  No - Comment as needed Significant Relationships:  Adult Children Lives with:  Adult Children Do you feel safe going back to the place where you live?  No Need for family participation in patient care:  Yes (Comment)  Care giving concerns:  Patient and family feel he needs some short term rehab before he is able to return back home.   Social Worker assessment / plan:  Patient is a 77 year old male who is alert and oriented x3.  Patient states he has not been to rehab before, CSW explained to patient the role and process for looking for SNF placement for short term rehab.  Patient lives with his son in a house, and they help take care of him at home.  Patient expressed that he feels weak and is not comfortable going home without some short term rehab first.  Patient was explained how insurance will pay for stay and also what to expect at discharge.  CSW was given permisssion to begin bed search in [facility], message left on patient's son's voice mail yesterday.  Patient did not have any more questions.  Employment status:  Retired Forensic scientist:  Medicare PT Recommendations:  [place]  / Referral to community resources:  [place]  Patient/Family's Response to care: Patient and family agreeable to going to SNF for short term rehab.  Patient/Family's Understanding of and Emotional Response to Diagnosis, Current Treatment, and Prognosis:  Patient is hopeful that he will not have to be in rehab for very long and can return back home.  Emotional Assessment Appearance:  Appears stated age Attitude/Demeanor/Rapport:    Affect (typically observed):  Appropriate, Stable, Calm Orientation:  Oriented to Place, Oriented to Self, Oriented to Situation Alcohol / Substance use:  Not Applicable Psych involvement (Current and /or in the community):  No (Comment)  Discharge Needs  Concerns to be addressed:  Care Coordination, Lack of Support Readmission within the last 30 days:  No Current discharge risk:  Lack of support system Barriers to Discharge:  Continued Medical Work up   Anell Barr 07/06/2017, 6:35 PM

## 2017-07-06 NOTE — Evaluation (Signed)
Physical Therapy Evaluation Patient Details Name: Billy Ortiz MRN: [id number] DOB: 03-Jan-1941 Today's Date: 07/06/2017   History of Present Illness  Patient is a 77 year old male admitted from home for SOB and LE edema.  PMH includes lung CA, pneumonia, Htn, HOH, GERD, cataract and elevated lipids.  Clinical Impression  Pt is a 77 year old male who lives in a two story home with his siblings, who are not able to care for him during the day.  Pt is independent without AD at baseline.  Pt is in bed and reports no pain at baseline.  He is able to perform bed mobility with mod I and sit at EOB with fair sitting balance.  PT provided several VC's for avoidance of posterior lean when sitting at EOB.  Pt able to stand with min A for avoidance of posterior LOB and VC's for proper use of RW.  MMT revealed decreased LE and UE strength and pt reported N/T in bilateral UE.  Pt presents with impulsivity during functional mobility and PT provided education concerning importance of slow, purposeful movement for fall prevention.  Pt able to ambulate 150 ft with RW and two rest breaks.  Pt presents with gait deviations which require min A to correct and is sometimes able to self correct.  PT provided education concerning importance of footwear in fall prevention.  Pt presented with labored breathing following ambulation and O2 sats remained in the 88-90% range.  Pt will continue to benefit from skilled PT with focus on balance, fall prevention, tolerance to activity and safe use of AD.  Due to fall risk and help being unavailable at home during the day, pt will benefit from a SNF setting.    Follow Up Recommendations SNF    Equipment Recommendations  Rolling walker with 5" wheels    Recommendations for Other Services       Precautions / Restrictions Precautions Precautions: Fall Restrictions Weight Bearing Restrictions: No      Mobility  Bed Mobility Overal bed mobility: Modified Independent             General bed mobility comments: Use of bed rail and increased time.  Transfers Overall transfer level: Needs assistance Equipment used: Rolling walker (2 wheeled) Transfers: Sit to/from Stand Sit to Stand: Min assist         General transfer comment: Pt able to initiate STS but requires heavy VC's and redirection to avoid posterior LOB and for proper hand placement.  Ambulation/Gait Ambulation/Gait assistance: Min assist Ambulation Distance (Feet): 150 Feet Assistive device: Rolling walker (2 wheeled)     Gait velocity interpretation: 1.31 - 2.62 ft/sec, indicative of limited community ambulator General Gait Details: Low foot clearance, ankle supination, lateral deviations which pt is able to self correct with exception of two times.  Pt is impulsive and requires slowed gait for turns and clearing objects.   PT provided education concerning proper use of RW and importance of slower and more purposeful movement.  O2 sats remained in the 88-90% range during ambulation.  Pt required two rest breaks and presented with labored breathing following ambulation.  Stairs            Wheelchair Mobility    Modified Rankin (Stroke Patients Only)       Balance Overall balance assessment: Needs assistance Sitting-balance support: Bilateral upper extremity supported;Feet supported   Sitting balance - Comments: Provided several VC's to sit upright  Postural control: Posterior lean Standing balance support: Bilateral upper extremity  supported                 High level balance activites: Side stepping;Backward walking;Direction changes;Sudden stops High Level Balance Comments: Pt requires close CGA due to unsteadiness on feet and impulsive movement.  Experiences lateral LOB's which PT assisted in correcting 1-2 times.             Pertinent Vitals/Pain Pain Assessment: No/denies pain    Home Living Family/patient expects to be discharged to:: Skilled nursing  facility Living Arrangements: Other relatives               Additional Comments: Pt lives with his siblings are also in poor health and/or unavailable during the day to care for him.    Prior Function Level of Independence: Independent               Hand Dominance        Extremity/Trunk Assessment   Upper Extremity Assessment Upper Extremity Assessment: Generalized weakness(Grossly 3+/5 bilaterally.  reports N/T in hands and fingers.)    Lower Extremity Assessment Lower Extremity Assessment: Overall WFL for tasks assessed(Grossly 4-/5)    Cervical / Trunk Assessment Cervical / Trunk Assessment: Kyphotic  Communication   Communication: HOH  Cognition Arousal/Alertness: Awake/alert Behavior During Therapy: WFL for tasks assessed/performed Overall Cognitive Status: Within Functional Limits for tasks assessed                                        General Comments      Exercises Other Exercises Other Exercises: Discussed corrective footwear for fall prevention   Assessment/Plan    PT Assessment Patient needs continued PT services  PT Problem List Decreased mobility;Decreased balance;Decreased knowledge of use of DME;Decreased safety awareness;Decreased activity tolerance       PT Treatment Interventions DME instruction;Therapeutic activities;Cognitive remediation;Gait training;Therapeutic exercise;Patient/family education;Stair training;Balance training;Functional mobility training;Neuromuscular re-education    PT Goals (Current goals can be found in the Care Plan section)  Acute Rehab PT Goals Patient Stated Goal: To eventually return home and be able to walk around in his yard again. PT Goal Formulation: With patient/family Time For Goal Achievement: 07/20/17 Potential to Achieve Goals: Good    Frequency Min 2X/week   Barriers to discharge        Co-evaluation               AM-PAC PT "6 Clicks" Daily Activity  Outcome  Measure Difficulty turning over in bed (including adjusting bedclothes, sheets and blankets)?: A Little Difficulty moving from lying on back to sitting on the side of the bed? : A Little Difficulty sitting down on and standing up from a chair with arms (e.g., wheelchair, bedside commode, etc,.)?: A Little Help needed moving to and from a bed to chair (including a wheelchair)?: A Little Help needed walking in hospital room?: A Little Help needed climbing 3-5 steps with a railing? : A Little 6 Click Score: 18    End of Session Equipment Utilized During Treatment: Gait belt Activity Tolerance: Patient limited by fatigue Patient left: in bed;with family/visitor present;with call bell/phone within reach;with nursing/sitter in room   PT Visit Diagnosis: Unsteadiness on feet (R26.81);Other abnormalities of gait and mobility (R26.89)    Time: 0900-0930 PT Time Calculation (min) (ACUTE ONLY): 30 min   Charges:   PT Evaluation $PT Eval Low Complexity: 1 Low PT Treatments $Gait Training: 8-22 mins  PT G Codes:   PT G-Codes **NOT FOR INPATIENT CLASS** Functional Assessment Tool Used: AM-PAC 6 Clicks Basic Mobility    Roxanne Gates, PT, DPT   Roxanne Gates, PT, DPT 07/06/2017, 9:47 AM

## 2017-07-07 ENCOUNTER — Ambulatory Visit: Payer: Medicare Other

## 2017-07-07 ENCOUNTER — Inpatient Hospital Stay: Payer: Medicare Other

## 2017-07-07 LAB — BASIC METABOLIC PANEL
ANION GAP: 7 (ref 5–15)
BUN: 56 mg/dL — ABNORMAL HIGH (ref 6–20)
CALCIUM: 9 mg/dL (ref 8.9–10.3)
CO2: 33 mmol/L — AB (ref 22–32)
Chloride: 95 mmol/L — ABNORMAL LOW (ref 101–111)
Creatinine, Ser: 3 mg/dL — ABNORMAL HIGH (ref 0.61–1.24)
GFR, EST AFRICAN AMERICAN: 22 mL/min — AB (ref >60)
GFR, EST NON AFRICAN AMERICAN: 19 mL/min — AB (ref >60)
GLUCOSE: 137 mg/dL — AB (ref 65–99)
POTASSIUM: 3.6 mmol/L (ref 3.5–5.1)
Sodium: 135 mmol/L (ref 135–145)

## 2017-07-07 MED ORDER — SODIUM CHLORIDE 0.9% FLUSH: 3.0000 mL | INTRAVENOUS | Status: DC | PRN

## 2017-07-07 MED ORDER — WITCH HAZEL-GLYCERIN EX PADS
MEDICATED_PAD | CUTANEOUS | Status: DC | PRN
  Administered 2017-07-07: 22:00:00 via TOPICAL
  Filled 2017-07-07: qty 100

## 2017-07-07 NOTE — Care Management Important Message (Signed)
Copy of signed IM left in patient's room.    

## 2017-07-07 NOTE — Progress Notes (Signed)
[place] [facility] [place] NAME: Billy Ortiz    MR#:  [id number]  DATE OF BIRTH:  [birth year]  SUBJECTIVE:   Patient is off oxygen today.  Good urine output.  Lower committee edema still present but slowly improving. Afebrile.  Continues to have cough and shortness of breath with orthopnea.  REVIEW OF SYSTEMS:    ROS  CONSTITUTIONAL: No documented fever. No fatigue, weakness. No weight gain, no weight loss.  EYES: No blurry or double vision.  ENT: No tinnitus. No postnasal drip. No redness of the oropharynx.  RESPIRATORY: Occasional cough, Has decreased wheeze, no hemoptysis. Has dyspnea.  CARDIOVASCULAR: No chest pain. No orthopnea. No palpitations. No syncope.  GASTROINTESTINAL: No nausea, no vomiting or diarrhea. No abdominal pain. No melena or hematochezia.  GENITOURINARY: No dysuria or hematuria.  ENDOCRINE: No polyuria or nocturia. No heat or cold intolerance.  HEMATOLOGY: No anemia. No bruising. No bleeding.  INTEGUMENTARY: No rashes. No lesions.  MUSCULOSKELETAL: No arthritis. No swelling. No gout.  Lower extremity edema NEUROLOGIC: No numbness, tingling, or ataxia. No seizure-type activity.  PSYCHIATRIC: No anxiety. No insomnia. No ADD.   DRUG ALLERGIES:   Allergies  Allergen Reactions  . Tramadol Hives  . Lisinopril     Other reaction(s): Unknown Other reaction(s): Unknown    VITALS:  Blood pressure (!) 166/75, pulse (!) 57, temperature 98 F (36.7 C), resp. rate 16, height 6\' 4"  (1.93 m), weight 83.5 kg (184 lb 1.6 oz), SpO2 94 %.  PHYSICAL EXAMINATION:   Physical Exam  GENERAL:  77 y.o.-year-old patient lying in the bed on oxygen via nasal canula EYES: Pupils equal, round, reactive to light and accommodation. No scleral icterus. Extraocular muscles intact.  HEENT: Head atraumatic, normocephalic. Oropharynx and nasopharynx clear.  NECK:  Supple, no jugular venous distention. No thyroid enlargement, no tenderness.   LUNGS: Bilateral wheezing. CARDIOVASCULAR: S1, S2 normal. No murmurs, rubs, or gallops.  ABDOMEN: Soft, nontender, nondistended. Bowel sounds present. No organomegaly or mass.  EXTREMITIES: No cyanosis, clubbing  Bilateral lower extremity edema NEUROLOGIC: Cranial nerves II through XII are intact. No focal Motor or sensory deficits b/l.   PSYCHIATRIC: The patient is alert and oriented x 3.  SKIN: No obvious rash, lesion, or ulcer.   LABORATORY PANEL:   CBC Recent Labs  Lab 07/03/17 0510  WBC 5.8  HGB 9.2*  HCT 27.4*  PLT 193   ------------------------------------------------------------------------------------------------------------------ Chemistries  Recent Labs  Lab 06/30/17 1831  07/07/17 0336  NA  --    < > 135  K  --    < > 3.6  CL  --    < > 95*  CO2  --    < > 33*  GLUCOSE  --    < > 137*  BUN  --    < > 56*  CREATININE  --    < > 3.00*  CALCIUM  --    < > 9.0  AST 28  --   --   ALT 38  --   --   ALKPHOS 69  --   --   BILITOT 0.6  --   --    < > = values in this interval not displayed.   ------------------------------------------------------------------------------------------------------------------  Cardiac Enzymes Recent Labs  Lab 07/02/17 1045  TROPONINI 0.05*   ------------------------------------------------------------------------------------------------------------------  RADIOLOGY:  US Renal  Result Date: 07/05/2017 CLINICAL DATA:  77 year old male with acute renal failure. Subsequent encounter. EXAM: RENAL / URINARY TRACT ULTRASOUND COMPLETE  COMPARISON:  06/22/2017 ultrasound.  05/10/2017 PET-CT. FINDINGS: Right Kidney: Length: 10.2 cm. Increased echogenicity. No hydronephrosis. Mid to upper pole 11 x 10.8 x 9 cm cyst. Lower pole 4.2 x 4.3 x 4.4 cm cyst. Left Kidney: Length: 10.4 cm. Echogenicity within normal limits. No hydronephrosis. Lower pole cyst measuring up to 3.4 cm, 9 mm and 1.5 cm. Bladder: Appears normal for degree of bladder  distention. Bilateral ureteral jets noted. IMPRESSION: No hydronephrosis. Bilateral cysts larger on the right. Increased echogenicity right renal parenchyma. Etiology indeterminate. Electronically Signed   By: Genia Del M.D.   On: 07/05/2017 17:24     ASSESSMENT AND PLAN:   77 year old male patient with history of chronic kidney disease stage IV, squamous cell lung cancer stage III, COPD currently under service for COPD exacerbation and respiratory distress.  - Acute hypoxic respiratory failure secondary to COPD exacerbation improved Weaned off oxygen Nebulization treatments as needed Oral steroids  - Acute COPD exacerbation resolved Nebulization treatments Prednisone Inhalers, nebulizers  - Bilateral lower extremity edema secondary to poor renal function secondary to hypertension  Nephrology consult appreciated On Lasix drip with good urine output.  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 Management plans discussed with the patient, family and they are in agreement.  CODE STATUS: DNR  DVT Prophylaxis: SCDs  TOTAL TIME TAKING CARE OF THIS PATIENT: 34 minutes.   POSSIBLE D/C IN 2 to 3 DAYS, DEPENDING ON CLINICAL CONDITION.  Billy Ortiz M.D on 07/07/2017 at 12:40 PM  Between 7am to 6pm - Pager - [phone]  After 6pm go to [email or web address] - password [email or web address] [facility]  Office  [phone]  CC: Primary care physician; [employer]  Note: This dictation was prepared with Diplomatic Services operational officer dictation along with smaller Company secretary. Any transcriptional errors that result from this process are unintentional.

## 2017-07-07 NOTE — Progress Notes (Signed)
[facility]  ROUNDING NOTE   Subjective:  Patient's lower extremity edema is improving.  Weight down to 184 pounds. Renal function currently stable.    Objective:  Vital signs in last 24 hours:  Temp:  [97.8 F (36.6 C)-98.2 F (36.8 C)] 98 F (36.7 C) (05/09 0819) Pulse Rate:  [56-65] 57 (05/09 0819) Resp:  [16-18] 16 (05/09 0819) BP: (158-168)/(75-85) 166/75 (05/09 0819) SpO2:  [94 %-99 %] 94 % (05/09 0819) Weight:  [83.5 kg (184 lb 1.6 oz)] 83.5 kg (184 lb 1.6 oz) (05/09 0343)  Weight change:  Filed Weights   07/04/17 0428 07/05/17 0351 07/07/17 0343  Weight: 86.7 kg (191 lb 3.2 oz) 87.9 kg (193 lb 12.8 oz) 83.5 kg (184 lb 1.6 oz)    Intake/Output: I/O last 3 completed shifts: In: 1313.5 [P.O.:840; I.V.:273.5; IV Piggyback:200] Out: 5075 [Urine:5075]   Intake/Output this shift:  Total I/O In: 120 [P.O.:120] Out: 400 [Urine:400]  Physical Exam: General: NAD, laying in bed  Head: Normocephalic, atraumatic. Moist oral mucosal membranes  Eyes: Anicteric  Neck: Supple, trachea midline  Lungs:  Basilar rales, normal effort  Heart: S1S2 2/6 SEM  Abdomen:  Soft, nontender, BS present   Extremities: 2+ bilateral LE edema  Neurologic: Awake, alert, follows commands  Skin: No lesions       Basic Metabolic Panel: Recent Labs  Lab 07/03/17 0510 07/04/17 0530 07/05/17 0726 07/06/17 0549 07/07/17 0336  NA 138 137 136 136 135  K 4.3 4.0 4.0 3.8 3.6  CL 100* 98* 98* 96* 95*  CO2 _0 33*  GLUCOSE 225* 184* 144* 151* 137*  BUN 47* 52* 48* 53* 56*  CREATININE 3.13* 3.23* 2.96* 3.24* 3.00*  CALCIUM 8.1* 8.2* 8.1* 8.8* 9.0    Liver Function Tests: Recent Labs  Lab 06/30/17 1831  AST 28  ALT 38  ALKPHOS 69  BILITOT 0.6  PROT 6.2*  ALBUMIN 2.7*   No results for input(s): LIPASE, AMYLASE in the last 168 hours. No results for input(s): AMMONIA in the last 168 hours.  CBC: Recent Labs  Lab 06/30/17 1822 07/02/17 1045 07/03/17 0510   WBC 9.7 10.4 5.8  NEUTROABS 7.1*  --   --   HGB 10.6* 11.3* 9.2*  HCT 32.7* 34.1* 27.4*  MCV 71.6* 71.6* 71.0*  PLT 294 233 193    Cardiac Enzymes: Recent Labs  Lab 06/30/17 1831 06/30/17 2045 07/02/17 1045  TROPONINI 0.03* 0.03* 0.05*    BNP: Invalid input(s): POCBNP  CBG: No results for input(s): GLUCAP in the last 168 hours.  Microbiology: Results for orders placed or performed during the hospital encounter of 06/03/17  Culture, bal-quantitative     Status: Abnormal   Collection Time: 06/03/17  2:48 PM  Result Value Ref Range Status   Specimen Description   Final    BRONCHIAL ALVEOLAR LAVAGE Performed at [facility], [address] [place], [place] [address]    Special Requests   Final    NONE Performed at [facility], [address] [place], [place] [address]    Gram Stain   Final    RARE WBC PRESENT,BOTH PMN AND MONONUCLEAR RARE SQUAMOUS EPITHELIAL CELLS PRESENT NO ORGANISMS SEEN Performed at [facility], [place] [address] [place], [place] [address]    Culture 2,000 COLONIES/mL STAPHYLOCOCCUS AUREUS (A)  Final   Report Status 06/06/2017 FINAL  Final   Organism ID, Bacteria STAPHYLOCOCCUS AUREUS (A)  Final      Susceptibility  Staphylococcus aureus - MIC*    CIPROFLOXACIN <=0.5 SENSITIVE Sensitive     ERYTHROMYCIN <=0.25 SENSITIVE Sensitive     GENTAMICIN <=0.5 SENSITIVE Sensitive     OXACILLIN 0.5 SENSITIVE Sensitive     TETRACYCLINE <=1 SENSITIVE Sensitive     VANCOMYCIN 1 SENSITIVE Sensitive     TRIMETH/SULFA <=10 SENSITIVE Sensitive     CLINDAMYCIN <=0.25 SENSITIVE Sensitive     RIFAMPIN <=0.5 SENSITIVE Sensitive     Inducible Clindamycin NEGATIVE Sensitive     * 2,000 COLONIES/mL STAPHYLOCOCCUS AUREUS  Acid Fast Smear (AFB)     Status: None   Collection Time: 06/03/17  2:48 PM  Result Value Ref Range Status   AFB Specimen Processing Concentration  Final   Acid Fast Smear Negative  Final    Comment:  (NOTE) Performed At: [facility] [address] [place], [place] [id number] Rush Farmer MD [id number]    Source (AFB) BRONCHIAL ALVEOLAR LAVAGE  Final    Comment: Performed at [facility], [address] [place], [place] [address]    Coagulation Studies: No results for input(s): LABPROT, INR in the last 72 hours.  Urinalysis: No results for input(s): COLORURINE, LABSPEC, PHURINE, GLUCOSEU, HGBUR, BILIRUBINUR, KETONESUR, PROTEINUR, UROBILINOGEN, NITRITE, LEUKOCYTESUR in the last 72 hours.  Invalid input(s): APPERANCEUR    Imaging: US Renal  Result Date: 07/05/2017 CLINICAL DATA:  77 year old male with acute renal failure. Subsequent encounter. EXAM: RENAL / URINARY TRACT ULTRASOUND COMPLETE COMPARISON:  06/22/2017 ultrasound.  05/10/2017 PET-CT. FINDINGS: Right Kidney: Length: 10.2 cm. Increased echogenicity. No hydronephrosis. Mid to upper pole 11 x 10.8 x 9 cm cyst. Lower pole 4.2 x 4.3 x 4.4 cm cyst. Left Kidney: Length: 10.4 cm. Echogenicity within normal limits. No hydronephrosis. Lower pole cyst measuring up to 3.4 cm, 9 mm and 1.5 cm. Bladder: Appears normal for degree of bladder distention. Bilateral ureteral jets noted. IMPRESSION: No hydronephrosis. Bilateral cysts larger on the right. Increased echogenicity right renal parenchyma. Etiology indeterminate. Electronically Signed   By: Genia Del M.D.   On: 07/05/2017 17:24     Medications:   . albumin human 0 g (07/06/17 1312)  . furosemide (LASIX) infusion 8 mg/hr (07/06/17 1500)   . amLODipine  2.5 mg Oral Daily  . atorvastatin  40 mg Oral q1800  . docusate sodium  100 mg Oral Daily  . fluticasone furoate-vilanterol  1 puff Inhalation Daily  . folic acid  1 mg Oral Daily  . gabapentin  300 mg Oral BID  . heparin  5,000 Units Subcutaneous Q8H  . hydrALAZINE  25 mg Oral BID  . ipratropium-albuterol  3 mL Nebulization BID  . isosorbide mononitrate  60 mg Oral Daily  . pantoprazole  40 mg Oral  Daily  . predniSONE  40 mg Oral Q breakfast  . sodium chloride flush  3 mL Intravenous Q12H   acetaminophen **OR** acetaminophen, albuterol, diphenhydrAMINE, guaiFENesin, hydrALAZINE, nitroGLYCERIN, ondansetron **OR** ondansetron (ZOFRAN) IV, polyethylene glycol, sodium chloride flush, sodium chloride flush  Assessment/ Plan:  Mr. NIKOLAY DEMETRIOU is a 77 y.o. black male with lung cancer, hypertension, GERD, hyperlipidemia, COPD, who was admitted to [facility] on 07/02/17.    1. 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 With hemoptysis and hematuria on prior admission.  Chronic kidney disease secondary to hypertension and bilateral renal cysts.  Negative ANA/ANCA/GBM 05/2017.   -Renal function slightly better today.  Continue Lasix drip as the patient's lower extremity edema is significantly  improving.  Monitor renal function daily.  2. Lower extremity edema.  Weight down to 184 pounds.  Legs much less tight.  Continue Lasix drip as above.  LOS: 5 Johnanthony Wilden 5/9/201911:35 AM

## 2017-07-07 NOTE — Plan of Care (Signed)
Continues to diurese without difficulty.

## 2017-07-07 NOTE — Clinical Social Work Note (Signed)
CSW spoke with patient's son Aylan Bayona. [phone], in regards to SNF placement and presented bed offers.  Patient's son will discuss with his siblings if they would like patient to go to SNF or go home with home health.  Patient's son requested information about Medicare, CSW emailed him medicare web site links for how [employer] will pay for SNF and if he wanted to go home with home health how Med [employer] will pay for home health therapy.  CSW also emailed patient's son the SNF list of different facilities.  Patient's son stated he will call CSW back in the morning with decision on what they want to do.  CSW to continue to follow patient's progress throughout discharge planning.  [name]. [place], MSW, [place]  07/07/2017 7:22 PM

## 2017-07-08 ENCOUNTER — Inpatient Hospital Stay: Payer: Medicare Other

## 2017-07-08 ENCOUNTER — Ambulatory Visit: Payer: Medicare Other

## 2017-07-08 ENCOUNTER — Inpatient Hospital Stay: Payer: Medicare Other | Admitting: Oncology

## 2017-07-08 DIAGNOSIS — I509 Heart failure, unspecified: Secondary | ICD-10-CM

## 2017-07-08 DIAGNOSIS — J9620 Acute and chronic respiratory failure, unspecified whether with hypoxia or hypercapnia: Secondary | ICD-10-CM

## 2017-07-08 LAB — BASIC METABOLIC PANEL
ANION GAP: 10 (ref 5–15)
BUN: 65 mg/dL — AB (ref 6–20)
CO2: 33 mmol/L — AB (ref 22–32)
Calcium: 8.9 mg/dL (ref 8.9–10.3)
Chloride: 94 mmol/L — ABNORMAL LOW (ref 101–111)
Creatinine, Ser: 3.06 mg/dL — ABNORMAL HIGH (ref 0.61–1.24)
GFR calc Af Amer: 21 mL/min — ABNORMAL LOW (ref >60)
GFR calc non Af Amer: 18 mL/min — ABNORMAL LOW (ref >60)
GLUCOSE: 147 mg/dL — AB (ref 65–99)
POTASSIUM: 3.6 mmol/L (ref 3.5–5.1)
Sodium: 137 mmol/L (ref 135–145)

## 2017-07-08 MED ORDER — AMLODIPINE BESYLATE 10 MG PO TABS
10.0000 mg | ORAL_TABLET | Freq: Every day | ORAL | Status: DC
  Administered 2017-07-09 – 2017-07-11 (×3): 10 mg via ORAL
  Filled 2017-07-08 (×3): qty 1

## 2017-07-08 NOTE — Clinical Social Work Note (Signed)
CSW spoke with patient's son Billy Ortiz. [phone] today.Son has decided that patient can return home and states that patient desires to go home. CSW will notify RN CM that patient will go home and needs home health at discharge. Patient's son also stated that he would like a list of medications when patient is discharged and stated that patient is supposed to be starting chemo and radiation soon. CSW explained that patient will need to follow up with his oncologist. [place] signing off. Please re-consult if CSW needs arise.   Annamaria Boots MSW, [place] [phone]

## 2017-07-08 NOTE — Progress Notes (Signed)
[facility]  ROUNDING NOTE   Subjective:  Continues to respond to lasix gtt.  Overall less edema noted as compared to admission.    Objective:  Vital signs in last 24 hours:  Temp:  [97.6 F (36.4 C)-98.5 F (36.9 C)] 98.5 F (36.9 C) (05/10 0749) Pulse Rate:  [58-66] 64 (05/10 0749) Resp:  [14-20] 18 (05/10 0500) BP: (129-154)/(50-79) 154/72 (05/10 0749) SpO2:  [95 %-97 %] 96 % (05/10 0749) Weight:  [83.6 kg (184 lb 3.2 oz)-84.2 kg (185 lb 10 oz)] 83.6 kg (184 lb 3.2 oz) (05/10 0738)  Weight change: 0.693 kg (1 lb 8.4 oz) Filed Weights   07/07/17 0343 07/08/17 0500 07/08/17 0738  Weight: 83.5 kg (184 lb 1.6 oz) 84.2 kg (185 lb 10 oz) 83.6 kg (184 lb 3.2 oz)    Intake/Output: I/O last 3 completed shifts: In: 1740.9 [P.O.:1080; I.V.:260.9; IV Piggyback:400] Out: 3675 [Urine:3675]   Intake/Output this shift:  Total I/O In: 243 [P.O.:240; I.V.:3] Out: 725 [Urine:725]  Physical Exam: General: NAD, laying in bed  Head: Normocephalic, atraumatic. Moist oral mucosal membranes  Eyes: Anicteric  Neck: Supple, trachea midline  Lungs:  Basilar rales, normal effort  Heart: S1S2 2/6 SEM  Abdomen:  Soft, nontender, BS present   Extremities: 2+ bilateral LE edema  Neurologic: Awake, alert, follows commands  Skin: No lesions       Basic Metabolic Panel: Recent Labs  Lab 07/04/17 0530 07/05/17 0726 07/06/17 0549 07/07/17 0336 07/08/17 0630  NA 137 136 136 135 137  K 4.0 4.0 3.8 3.6 3.6  CL 98* 98* 96* 95* 94*  CO2 _0 33* 33*  GLUCOSE 184* 144* 151* 137* 147*  BUN 52* 48* 53* 56* 65*  CREATININE 3.23* 2.96* 3.24* 3.00* 3.06*  CALCIUM 8.2* 8.1* 8.8* 9.0 8.9    Liver Function Tests: No results for input(s): AST, ALT, ALKPHOS, BILITOT, PROT, ALBUMIN in the last 168 hours. No results for input(s): LIPASE, AMYLASE in the last 168 hours. No results for input(s): AMMONIA in the last 168 hours.  CBC: Recent Labs  Lab 07/02/17 1045 07/03/17 0510   WBC 10.4 5.8  HGB 11.3* 9.2*  HCT 34.1* 27.4*  MCV 71.6* 71.0*  PLT 233 193    Cardiac Enzymes: Recent Labs  Lab 07/02/17 1045  TROPONINI 0.05*    BNP: Invalid input(s): POCBNP  CBG: No results for input(s): GLUCAP in the last 168 hours.  Microbiology: Results for orders placed or performed during the hospital encounter of 06/03/17  Culture, bal-quantitative     Status: Abnormal   Collection Time: 06/03/17  2:48 PM  Result Value Ref Range Status   Specimen Description   Final    BRONCHIAL ALVEOLAR LAVAGE Performed at [facility], [address], [place], [place] [address]    Special Requests   Final    NONE Performed at [facility], [address] [place], [place] [address]    Gram Stain   Final    RARE WBC PRESENT,BOTH PMN AND MONONUCLEAR RARE SQUAMOUS EPITHELIAL CELLS PRESENT NO ORGANISMS SEEN Performed at [facility], [place] [address], [place], [place] [address]    Culture 2,000 COLONIES/mL STAPHYLOCOCCUS AUREUS (A)  Final   Report Status 06/06/2017 FINAL  Final   Organism ID, Bacteria STAPHYLOCOCCUS AUREUS (A)  Final      Susceptibility   Staphylococcus aureus - MIC*    CIPROFLOXACIN <=0.5 SENSITIVE Sensitive     ERYTHROMYCIN <=0.25 SENSITIVE Sensitive  GENTAMICIN <=0.5 SENSITIVE Sensitive     OXACILLIN 0.5 SENSITIVE Sensitive     TETRACYCLINE <=1 SENSITIVE Sensitive     VANCOMYCIN 1 SENSITIVE Sensitive     TRIMETH/SULFA <=10 SENSITIVE Sensitive     CLINDAMYCIN <=0.25 SENSITIVE Sensitive     RIFAMPIN <=0.5 SENSITIVE Sensitive     Inducible Clindamycin NEGATIVE Sensitive     * 2,000 COLONIES/mL STAPHYLOCOCCUS AUREUS  Acid Fast Smear (AFB)     Status: None   Collection Time: 06/03/17  2:48 PM  Result Value Ref Range Status   AFB Specimen Processing Concentration  Final   Acid Fast Smear Negative  Final    Comment: (NOTE) Performed At: [facility] [address] [place], [place] [id number] Rush Farmer MD  VZ:[id number]    Source (AFB) BRONCHIAL ALVEOLAR LAVAGE  Final    Comment: Performed at [facility], [address] [place], [place] [address]    Coagulation Studies: No results for input(s): LABPROT, INR in the last 72 hours.  Urinalysis: No results for input(s): COLORURINE, LABSPEC, PHURINE, GLUCOSEU, HGBUR, BILIRUBINUR, KETONESUR, PROTEINUR, UROBILINOGEN, NITRITE, LEUKOCYTESUR in the last 72 hours.  Invalid input(s): APPERANCEUR    Imaging: No results found.   Medications:   . albumin human 0 g (07/07/17 1300)  . furosemide (LASIX) infusion 8 mg/hr (07/07/17 1742)   . [START ON 07/09/2017] amLODipine  10 mg Oral Daily  . atorvastatin  40 mg Oral q1800  . docusate sodium  100 mg Oral Daily  . fluticasone furoate-vilanterol  1 puff Inhalation Daily  . folic acid  1 mg Oral Daily  . gabapentin  300 mg Oral BID  . heparin  5,000 Units Subcutaneous Q8H  . hydrALAZINE  25 mg Oral BID  . ipratropium-albuterol  3 mL Nebulization BID  . isosorbide mononitrate  60 mg Oral Daily  . pantoprazole  40 mg Oral Daily  . predniSONE  40 mg Oral Q breakfast  . sodium chloride flush  3 mL Intravenous Q12H   acetaminophen **OR** acetaminophen, albuterol, diphenhydrAMINE, guaiFENesin, hydrALAZINE, nitroGLYCERIN, ondansetron **OR** ondansetron (ZOFRAN) IV, polyethylene glycol, sodium chloride flush, sodium chloride flush, witch hazel-glycerin  Assessment/ Plan:  [name] is a 77 y.o. black male with lung cancer, hypertension, GERD, hyperlipidemia, COPD, who was admitted to [facility] on 07/02/17.    1. 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 With hemoptysis and hematuria on prior admission.  Chronic kidney disease secondary to hypertension and bilateral renal cysts.  Negative ANA/ANCA/GBM 05/2017.   - Continue lasix gtt as patient having a good diuretic response to this.  Continue to monitor renal function daily.    2. Lower extremity edema.  Continue diuresis with lasix gtt for one additional day.    LOS: 6 Billy Ortiz 5/10/20193:40 PM

## 2017-07-08 NOTE — Progress Notes (Signed)
[facility] NAME: Aniket Paye    MR#:  [id number]  DATE OF BIRTH:  1940/05/28  SUBJECTIVE:   Continues to have shortness of breath Orthopnea present. Lower committee edema is improving but still significant  REVIEW OF SYSTEMS:    ROS  CONSTITUTIONAL: No documented fever. No fatigue, weakness. No weight gain, no weight loss.  EYES: No blurry or double vision.  ENT: No tinnitus. No postnasal drip. No redness of the oropharynx.  RESPIRATORY: Occasional cough, Has decreased wheeze, no hemoptysis. Has dyspnea.  CARDIOVASCULAR: No chest pain. No orthopnea. No palpitations. No syncope.  GASTROINTESTINAL: No nausea, no vomiting or diarrhea. No abdominal pain. No melena or hematochezia.  GENITOURINARY: No dysuria or hematuria.  ENDOCRINE: No polyuria or nocturia. No heat or cold intolerance.  HEMATOLOGY: No anemia. No bruising. No bleeding.  INTEGUMENTARY: No rashes. No lesions.  MUSCULOSKELETAL: No arthritis. No swelling. No gout.  Lower extremity edema NEUROLOGIC: No numbness, tingling, or ataxia. No seizure-type activity.  PSYCHIATRIC: No anxiety. No insomnia. No ADD.   DRUG ALLERGIES:   Allergies  Allergen Reactions  . Tramadol Hives  . Lisinopril     Other reaction(s): Unknown Other reaction(s): Unknown    VITALS:  Blood pressure (!) 154/72, pulse 64, temperature 98.5 F (36.9 C), temperature source Oral, resp. rate 18, height 6\' 4"  (1.93 m), weight 83.6 kg (184 lb 3.2 oz), SpO2 96 %.  PHYSICAL EXAMINATION:   Physical Exam  GENERAL:  77 y.o.-year-old patient lying in the bed on oxygen via nasal canula EYES: Pupils equal, round, reactive to light and accommodation. No scleral icterus. Extraocular muscles intact.  HEENT: Head atraumatic, normocephalic. Oropharynx and nasopharynx clear.  NECK:  Supple, no jugular venous distention. No thyroid enlargement, no tenderness.  LUNGS: Bilateral wheezing. CARDIOVASCULAR: S1,  S2 normal. No murmurs, rubs, or gallops.  ABDOMEN: Soft, nontender, nondistended. Bowel sounds present. No organomegaly or mass.  EXTREMITIES: No cyanosis, clubbing  Bilateral lower extremity edema NEUROLOGIC: Cranial nerves II through XII are intact. No focal Motor or sensory deficits b/l.   PSYCHIATRIC: The patient is alert and oriented x 3.  SKIN: No obvious rash, lesion, or ulcer.   LABORATORY PANEL:   CBC Recent Labs  Lab 07/03/17 0510  WBC 5.8  HGB 9.2*  HCT 27.4*  PLT 193   ------------------------------------------------------------------------------------------------------------------ Chemistries  Recent Labs  Lab 07/08/17 0630  NA 137  K 3.6  CL 94*  CO2 33*  GLUCOSE 147*  BUN 65*  CREATININE 3.06*  CALCIUM 8.9   ------------------------------------------------------------------------------------------------------------------  Cardiac Enzymes Recent Labs  Lab 07/02/17 1045  TROPONINI 0.05*   ------------------------------------------------------------------------------------------------------------------  RADIOLOGY:  No results found.   ASSESSMENT AND PLAN:   77 year old male patient with history of chronic kidney disease stage IV, squamous cell lung cancer stage III, COPD currently under service for COPD exacerbation and respiratory distress.  - Acute hypoxic respiratory failure secondary to COPD exacerbation improved Weaned off oxygen Nebulization treatments as needed Oral steroids  - Acute COPD exacerbation resolved Nebulization treatments Prednisone Inhalers, nebulizers  - Bilateral lower extremity edema secondary to poor renal function secondary to hypertension  Nephrology consult appreciated On Lasix drip with good urine output.  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  -CKD stage  IV Monitor while being diuresed on the Lasix drip  -Uncontrolled hypertension Continue PRN IV hydralazine Continue  antihypertensive medication   DVT prophylaxis subcu heparin  All the records are reviewed and case discussed with Care Management/Social Worker. Management plans discussed with the patient, family and they are in agreement.  CODE STATUS: DNR  DVT Prophylaxis: SCDs  TOTAL TIME TAKING CARE OF THIS PATIENT: 30 minutes.   POSSIBLE D/C IN 2 to 3 DAYS, DEPENDING ON CLINICAL CONDITION.  Leia Alf Jaimy Kliethermes M.D on 07/08/2017 at 11:52 AM  Between 7am to 6pm - Pager - [phone]  After 6pm go to [email or web address] - password [email or web address] [facility]  Office  [phone]  CC: Primary care physician; [employer]  Note: This dictation was prepared with Diplomatic Services operational officer dictation along with smaller Company secretary. Any transcriptional errors that result from this process are unintentional.
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
## 2017-07-09 LAB — MAGNESIUM: MAGNESIUM: 2.1 mg/dL (ref 1.7–2.4)

## 2017-07-09 LAB — BASIC METABOLIC PANEL
ANION GAP: 9 (ref 5–15)
BUN: 59 mg/dL — AB (ref 6–20)
CHLORIDE: 94 mmol/L — AB (ref 101–111)
CO2: 33 mmol/L — AB (ref 22–32)
Calcium: 9.3 mg/dL (ref 8.9–10.3)
Creatinine, Ser: 2.57 mg/dL — ABNORMAL HIGH (ref 0.61–1.24)
GFR calc Af Amer: 26 mL/min — ABNORMAL LOW (ref >60)
GFR calc non Af Amer: 23 mL/min — ABNORMAL LOW (ref >60)
GLUCOSE: 165 mg/dL — AB (ref 65–99)
POTASSIUM: 3.2 mmol/L — AB (ref 3.5–5.1)
Sodium: 136 mmol/L (ref 135–145)

## 2017-07-09 MED ORDER — BENZONATATE 100 MG PO CAPS
100.0000 mg | ORAL_CAPSULE | Freq: Three times a day (TID) | ORAL | Status: DC | PRN
  Administered 2017-07-11: 100 mg via ORAL
  Filled 2017-07-09: qty 1

## 2017-07-09 MED ORDER — HYDRALAZINE HCL 50 MG PO TABS
50.0000 mg | ORAL_TABLET | Freq: Three times a day (TID) | ORAL | Status: DC
  Administered 2017-07-09 – 2017-07-11 (×6): 50 mg via ORAL
  Filled 2017-07-09 (×6): qty 1

## 2017-07-09 MED ORDER — IPRATROPIUM-ALBUTEROL 0.5-2.5 (3) MG/3ML IN SOLN
3.0000 mL | Freq: Four times a day (QID) | RESPIRATORY_TRACT | Status: DC
  Administered 2017-07-09: 3 mL via RESPIRATORY_TRACT
  Filled 2017-07-09: qty 3

## 2017-07-09 MED ORDER — GUAIFENESIN ER 600 MG PO TB12
600.0000 mg | ORAL_TABLET | Freq: Two times a day (BID) | ORAL | Status: DC
  Administered 2017-07-09 – 2017-07-11 (×5): 600 mg via ORAL
  Filled 2017-07-09 (×5): qty 1

## 2017-07-09 MED ORDER — HYDROCORTISONE ACETATE 25 MG RE SUPP
25.0000 mg | Freq: Two times a day (BID) | RECTAL | Status: AC
  Administered 2017-07-09 – 2017-07-10 (×4): 25 mg via RECTAL
  Filled 2017-07-09 (×4): qty 1

## 2017-07-09 MED ORDER — BUDESONIDE 0.5 MG/2ML IN SUSP
0.5000 mg | Freq: Two times a day (BID) | RESPIRATORY_TRACT | Status: DC
  Administered 2017-07-09 – 2017-07-11 (×4): 0.5 mg via RESPIRATORY_TRACT
  Filled 2017-07-09 (×4): qty 2

## 2017-07-09 NOTE — Progress Notes (Signed)
Patient walked around the nurses station x1 (137ft) using a walker on room air. Patient steady, no complaints. Patient has some mild shortness of breath that resolved once back in the bed. Will continue to monitor patient.

## 2017-07-09 NOTE — Progress Notes (Signed)
[facility]  ROUNDING NOTE   Subjective:  Patient remains on Lasix drip. Urine output did drop to about 1.2 L yesterday. Renal function has improved a bit today. Weight down to 182 pounds today.   Objective:  Vital signs in last 24 hours:  Temp:  [97.5 F (36.4 C)-98.5 F (36.9 C)] 98.4 F (36.9 C) (05/11 0843) Pulse Rate:  [62-69] 69 (05/11 0843) Resp:  [18-20] 18 (05/11 0843) BP: (143-170)/(72-83) 167/72 (05/11 0843) SpO2:  [90 %-95 %] 94 % (05/11 0843) Weight:  [82.8 kg (182 lb 8 oz)] 82.8 kg (182 lb 8 oz) (05/11 0500)  Weight change: -0.648 kg (-1 lb 6.8 oz) Filed Weights   07/08/17 0500 07/08/17 0738 07/09/17 0500  Weight: 84.2 kg (185 lb 10 oz) 83.6 kg (184 lb 3.2 oz) 82.8 kg (182 lb 8 oz)    Intake/Output: I/O last 3 completed shifts: In: 2094.3 [P.O.:1680; I.V.:114.3; IV Piggyback:300] Out: 2575 [Urine:2575]   Intake/Output this shift:  Total I/O In: 894 [P.O.:770; I.V.:24; IV Piggyback:100] Out: 300 [Urine:300]  Physical Exam: General: NAD, laying in bed  Head: Normocephalic, atraumatic. Moist oral mucosal membranes  Eyes: Anicteric  Neck: Supple, trachea midline  Lungs:  Basilar rales, normal effort  Heart: S1S2 2/6 SEM  Abdomen:  Soft, nontender, BS present   Extremities: 2+ bilateral LE edema, much less tight.   Neurologic: Awake, alert, follows commands  Skin: No lesions       Basic Metabolic Panel: Recent Labs  Lab 07/05/17 0726 07/06/17 0549 07/07/17 0336 07/08/17 0630 07/09/17 1050  NA 136 136 135 137 136  K 4.0 3.8 3.6 3.6 3.2*  CL 98* 96* 95* 94* 94*  CO2 30 30 33* 33* 33*  GLUCOSE 144* 151* 137* 147* 165*  BUN 48* 53* 56* 65* 59*  CREATININE 2.96* 3.24* 3.00* 3.06* 2.57*  CALCIUM 8.1* 8.8* 9.0 8.9 9.3    Liver Function Tests: No results for input(s): AST, ALT, ALKPHOS, BILITOT, PROT, ALBUMIN in the last 168 hours. No results for input(s): LIPASE, AMYLASE in the last 168 hours. No results for input(s): AMMONIA in  the last 168 hours.  CBC: Recent Labs  Lab 07/03/17 0510  WBC 5.8  HGB 9.2*  HCT 27.4*  MCV 71.0*  PLT 193    Cardiac Enzymes: No results for input(s): CKTOTAL, CKMB, CKMBINDEX, TROPONINI in the last 168 hours.  BNP: Invalid input(s): POCBNP  CBG: No results for input(s): GLUCAP in the last 168 hours.  Microbiology: Results for orders placed or performed during the hospital encounter of 06/03/17  Culture, bal-quantitative     Status: Abnormal   Collection Time: 06/03/17  2:48 PM  Result Value Ref Range Status   Specimen Description   Final    BRONCHIAL ALVEOLAR LAVAGE Performed at [facility], [address] [place], [place] [address]    Special Requests   Final    NONE Performed at [facility], [address] [place], [place] [address]    Gram Stain   Final    RARE WBC PRESENT,BOTH PMN AND MONONUCLEAR RARE SQUAMOUS EPITHELIAL CELLS PRESENT NO ORGANISMS SEEN Performed at [facility], [place] [address] [place], [place] [address]    Culture 2,000 COLONIES/mL STAPHYLOCOCCUS AUREUS (A)  Final   Report Status 06/06/2017 FINAL  Final   Organism ID, Bacteria STAPHYLOCOCCUS AUREUS (A)  Final      Susceptibility   Staphylococcus aureus - MIC*    CIPROFLOXACIN <=0.5 SENSITIVE Sensitive  ERYTHROMYCIN <=0.25 SENSITIVE Sensitive     GENTAMICIN <=0.5 SENSITIVE Sensitive     OXACILLIN 0.5 SENSITIVE Sensitive     TETRACYCLINE <=1 SENSITIVE Sensitive     VANCOMYCIN 1 SENSITIVE Sensitive     TRIMETH/SULFA <=10 SENSITIVE Sensitive     CLINDAMYCIN <=0.25 SENSITIVE Sensitive     RIFAMPIN <=0.5 SENSITIVE Sensitive     Inducible Clindamycin NEGATIVE Sensitive     * 2,000 COLONIES/mL STAPHYLOCOCCUS AUREUS  Acid Fast Smear (AFB)     Status: None   Collection Time: 06/03/17  2:48 PM  Result Value Ref Range Status   AFB Specimen Processing Concentration  Final   Acid Fast Smear Negative  Final    Comment: (NOTE) Performed At: [facility], [facility], [facility] [address] [place], [place] [id number] Rush Farmer MD [id number]    Source (AFB) BRONCHIAL ALVEOLAR LAVAGE  Final    Comment: Performed at [facility], [address] [place], [place] [address]    Coagulation Studies: No results for input(s): LABPROT, INR in the last 72 hours.  Urinalysis: No results for input(s): COLORURINE, LABSPEC, PHURINE, GLUCOSEU, HGBUR, BILIRUBINUR, KETONESUR, PROTEINUR, UROBILINOGEN, NITRITE, LEUKOCYTESUR in the last 72 hours.  Invalid input(s): APPERANCEUR    Imaging: No results found.   Medications:   . albumin human 0 g (07/07/17 1300)  . furosemide (LASIX) infusion 8 mg/hr (07/09/17 0324)   . amLODipine  10 mg Oral Daily  . atorvastatin  40 mg Oral q1800  . docusate sodium  100 mg Oral Daily  . fluticasone furoate-vilanterol  1 puff Inhalation Daily  . folic acid  1 mg Oral Daily  . gabapentin  300 mg Oral BID  . heparin  5,000 Units Subcutaneous Q8H  . hydrALAZINE  50 mg Oral Q8H  . isosorbide mononitrate  60 mg Oral Daily  . pantoprazole  40 mg Oral Daily  . predniSONE  40 mg Oral Q breakfast  . sodium chloride flush  3 mL Intravenous Q12H   acetaminophen **OR** acetaminophen, albuterol, diphenhydrAMINE, guaiFENesin, hydrALAZINE, nitroGLYCERIN, ondansetron **OR** ondansetron (ZOFRAN) IV, polyethylene glycol, sodium chloride flush, sodium chloride flush, witch hazel-glycerin  Assessment/ Plan:  [name] is a 77 y.o. black male with lung cancer, hypertension, GERD, hyperlipidemia, COPD, who was admitted to [facility] on 07/02/17.    1. 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 With hemoptysis and hematuria on prior admission.  Chronic kidney disease secondary to hypertension and bilateral renal cysts.  Negative ANA/ANCA/GBM 05/2017.   -Renal function has improved now.  BUN down to 59 with a creatinine of 2.57.  We will continue Lasix drip 1  additional day.  Follow renal parameters closely.  2. Lower extremity edema.  Markedly improved as compared to admission.  He is responded well to Lasix drip.  Renal function is also remained stable and in fact improved a bit.  Maintain Lasix drip for 1 additional day as above.  LOS: 7 Waldo Damian 5/11/201912:44 PM

## 2017-07-09 NOTE — Plan of Care (Signed)
  Problem: Clinical Measurements: Goal: Respiratory complications will improve Outcome: Progressing   

## 2017-07-09 NOTE — Progress Notes (Signed)
CCMD called to notify patient had 7 beats of vtach. Patient asymptomatic, laying comfortably in the bed with no complaints.  MD Salary paged to notify. Will continue to monitor patient. Currently patient's heart rate in the 70s.

## 2017-07-09 NOTE — Progress Notes (Signed)
Verbal order to put in add-on for magnesium lab. Order put in per Dr.Salary. MD will put in orders for cardiology consult and additional medication for mucous relief. Will continue to monitor patient.

## 2017-07-09 NOTE — Progress Notes (Signed)
[facility] [place] NAME: Billy Ortiz    MR#:  [id number]  DATE OF BIRTH:  25-Nov-1940  SUBJECTIVE:  CHIEF COMPLAINT:   Chief Complaint  Patient presents with  . Wheezing  Complains of anal pain, sister at the bedside, case discussed with nephrology-their input is greatly appreciated, continue Lasix drips, TED hose  REVIEW OF SYSTEMS:  CONSTITUTIONAL: No fever, fatigue or weakness.  EYES: No blurred or double vision.  EARS, NOSE, AND THROAT: No tinnitus or ear pain.  RESPIRATORY: No cough, shortness of breath, wheezing or hemoptysis.  CARDIOVASCULAR: No chest pain, orthopnea, edema.  GASTROINTESTINAL: No nausea, vomiting, diarrhea or abdominal pain.  GENITOURINARY: No dysuria, hematuria.  ENDOCRINE: No polyuria, nocturia,  HEMATOLOGY: No anemia, easy bruising or bleeding SKIN: No rash or lesion. MUSCULOSKELETAL: No joint pain or arthritis.   NEUROLOGIC: No tingling, numbness, weakness.  PSYCHIATRY: No anxiety or depression.   ROS  DRUG ALLERGIES:   Allergies  Allergen Reactions  . Tramadol Hives  . Lisinopril     Other reaction(s): Unknown Other reaction(s): Unknown    VITALS:  Blood pressure (!) 167/72, pulse 69, temperature 98.4 F (36.9 C), temperature source Oral, resp. rate 18, height 6\' 4"  (1.93 m), weight 82.8 kg (182 lb 8 oz), SpO2 94 %.  PHYSICAL EXAMINATION:  GENERAL:  77 y.o.-year-old patient lying in the bed with no acute distress.  EYES: Pupils equal, round, reactive to light and accommodation. No scleral icterus. Extraocular muscles intact.  HEENT: Head atraumatic, normocephalic. Oropharynx and nasopharynx clear.  NECK:  Supple, no jugular venous distention. No thyroid enlargement, no tenderness.  LUNGS: Normal breath sounds bilaterally, no wheezing, rales,rhonchi or crepitation. No use of accessory muscles of respiration.  CARDIOVASCULAR: S1, S2 normal. No murmurs, rubs, or gallops.  ABDOMEN: Soft,  nontender, nondistended. Bowel sounds present. No organomegaly or mass.  EXTREMITIES: No pedal edema, cyanosis, or clubbing.  NEUROLOGIC: Cranial nerves II through XII are intact. Muscle strength 5/5 in all extremities. Sensation intact. Gait not checked.  PSYCHIATRIC: The patient is alert and oriented x 3.  SKIN: No obvious rash, lesion, or ulcer.   Physical Exam LABORATORY PANEL:   CBC Recent Labs  Lab 07/03/17 0510  WBC 5.8  HGB 9.2*  HCT 27.4*  PLT 193   ------------------------------------------------------------------------------------------------------------------  Chemistries  Recent Labs  Lab 07/09/17 1050  NA 136  K 3.2*  CL 94*  CO2 33*  GLUCOSE 165*  BUN 59*  CREATININE 2.57*  CALCIUM 9.3   ------------------------------------------------------------------------------------------------------------------  Cardiac Enzymes No results for input(s): TROPONINI in the last 168 hours. ------------------------------------------------------------------------------------------------------------------  RADIOLOGY:  No results found.  ASSESSMENT AND PLAN:  77 year old male patient with history of chronic kidney disease stage IV, squamous cell lung cancer stage III, COPD currently under service for COPD exacerbation and respiratory distress.  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 hydralazine, vitals per routine, make changes as per necessary  -Acute perianal ulcerations with hemorrhoids noted Wound care nurse consulted to evaluate/treat Anusol twice daily for now   DVT prophylaxis subcu heparin    All the records are reviewed and case discussed with Care Management/Social Workerr. Management plans discussed with the patient, family and they are in agreement.  CODE STATUS: DNR  TOTAL TIME TAKING CARE OF THIS PATIENT: 35 minutes.     POSSIBLE D/C IN 1-2 DAYS, DEPENDING ON CLINICAL CONDITION.   Avel Peace Sohail Capraro M.D on 07/09/2017   Between 7am to 6pm - Pager - [phone]  After 6pm go to [email or web address] - password [email or web address] [facility]  Office  [phone]  CC: Primary care physician; [employer]  Note: This dictation was prepared with Diplomatic Services operational officer dictation along with smaller Company secretary. Any transcriptional errors that result from this process are unintentional.

## 2017-07-09 NOTE — Progress Notes (Signed)
Physical Therapy Treatment Patient Details Name: Billy Ortiz MRN: [id number] DOB: 03/13/1940 Today's Date: 07/09/2017    History of Present Illness Patient is a 77 year old male admitted from home for SOB and LE edema.  PMH includes lung CA, pneumonia, Htn, HOH, GERD, cataract and elevated lipids.    PT Comments    Pt was seen for gait with minor help to walk and noted O2 sats were 82% on room air, recovered slowly on cannula.  He is motivated but quite confused and will need significant help to restore safety with assistance'.  Follow acutely but progress with care not to over train with O2 sat drops.  Follow Up Recommendations  SNF     Equipment Recommendations  Rolling walker with 5" wheels    Recommendations for Other Services       Precautions / Restrictions Precautions Precautions: Fall Restrictions Weight Bearing Restrictions: No    Mobility  Bed Mobility               General bed mobility comments: Use of bed rail and increased time.  Transfers Overall transfer level: Needs assistance Equipment used: Rolling walker (2 wheeled);1 person hand held assist Transfers: Sit to/from Stand Sit to Stand: Min assist         General transfer comment: reminded 100% of the time to use correct hand placement  Ambulation/Gait Ambulation/Gait assistance: Min assist Ambulation Distance (Feet): 300 Feet Assistive device: Rolling walker (2 wheeled) Gait Pattern/deviations: Step-through pattern;Decreased stride length;Narrow base of support Gait velocity: reduced Gait velocity interpretation: >4.37 ft/sec, indicative of normal walking speed General Gait Details: Pt needs PT to instruct him in safe transfes and gait   Stairs             Wheelchair Mobility    Modified Rankin (Stroke Patients Only)       Balance Overall balance assessment: Needs assistance Sitting-balance support: Bilateral upper extremity supported;Feet unsupported Sitting  balance-Leahy Scale: Fair Sitting balance - Comments: pt was able to progrees to no UE    Standing balance support: Bilateral upper extremity supported Standing balance-Leahy Scale: Fair                              Cognition Arousal/Alertness: Awake/alert   Overall Cognitive Status: History of cognitive impairments - at baseline                                        Exercises      General Comments General comments (skin integrity, edema, etc.): pt had O2 sats that were 82% on room air after walk      Pertinent Vitals/Pain Pain Assessment: No/denies pain    Home Living                      Prior Function            PT Goals (current goals can now be found in the care plan section) Acute Rehab PT Goals Patient Stated Goal: get home and feel better Progress towards PT goals: Progressing toward goals    Frequency    Min 2X/week      PT Plan Current plan remains appropriate    Co-evaluation              AM-PAC PT "6 Clicks" Daily Activity  Outcome Measure  Difficulty turning over in bed (including adjusting bedclothes, sheets and blankets)?: Unable Difficulty moving from lying on back to sitting on the side of the bed? : Unable Difficulty sitting down on and standing up from a chair with arms (e.g., wheelchair, bedside commode, etc,.)?: Unable Help needed moving to and from a bed to chair (including a wheelchair)?: A Little Help needed walking in hospital room?: A Little Help needed climbing 3-5 steps with a railing? : A Lot 6 Click Score: 11    End of Session Equipment Utilized During Treatment: Gait belt Activity Tolerance: Patient limited by fatigue Patient left: in bed;with family/visitor present;with call bell/phone within reach;with nursing/sitter in room Nurse Communication: Mobility status PT Visit Diagnosis: Unsteadiness on feet (R26.81);Other abnormalities of gait and mobility (R26.89)     Time:  1431-1455 PT Time Calculation (min) (ACUTE ONLY): 24 min  Charges:  $Gait Training: 8-22 mins $Therapeutic Activity: 8-22 mins                    G Codes:  Functional Assessment Tool Used: AM-PAC 6 Clicks Basic Mobility     Ramond Dial 07/09/2017, 11:42 PM   Mee Hives, PT MS Acute Rehab Dept. Number: [place] [facility] [place]

## 2017-07-10 LAB — PLATELET COUNT: Platelets: 196 10*3/uL (ref 150–440)

## 2017-07-10 MED ORDER — METOPROLOL SUCCINATE ER 25 MG PO TB24
25.0000 mg | ORAL_TABLET | Freq: Every day | ORAL | Status: DC
  Administered 2017-07-10: 25 mg via ORAL
  Filled 2017-07-10: qty 1

## 2017-07-10 MED ORDER — POTASSIUM CHLORIDE 10 MEQ/100ML IV SOLN
10.0000 meq | INTRAVENOUS | Status: AC
  Administered 2017-07-10 (×4): 10 meq via INTRAVENOUS
  Filled 2017-07-10 (×4): qty 100

## 2017-07-10 MED ORDER — METOPROLOL SUCCINATE ER 50 MG PO TB24
50.0000 mg | ORAL_TABLET | Freq: Every day | ORAL | Status: DC
  Administered 2017-07-11: 50 mg via ORAL
  Filled 2017-07-10: qty 1

## 2017-07-10 MED ORDER — FUROSEMIDE 40 MG PO TABS
80.0000 mg | ORAL_TABLET | Freq: Two times a day (BID) | ORAL | Status: DC
  Administered 2017-07-10 – 2017-07-11 (×2): 80 mg via ORAL
  Filled 2017-07-10 (×2): qty 2

## 2017-07-10 NOTE — Progress Notes (Signed)
[facility]  ROUNDING NOTE   Subjective:  Good urine output yesterday at 2.7 L. Creatinine also down to 2.6. Edema significantly improved.  Objective:  Vital signs in last 24 hours:  Temp:  [98 F (36.7 C)-98.6 F (37 C)] 98.6 F (37 C) (05/12 0808) Pulse Rate:  [62-71] 62 (05/12 1402) Resp:  [16-18] 16 (05/12 0808) BP: (126-165)/(70-89) 148/77 (05/12 1402) SpO2:  [90 %-100 %] 99 % (05/12 0808) Weight:  [80.4 kg (177 lb 3.2 oz)] 80.4 kg (177 lb 3.2 oz) (05/12 0421)  Weight change: -3.175 kg (-7 lb) Filed Weights   07/08/17 0738 07/09/17 0500 07/10/17 0421  Weight: 83.6 kg (184 lb 3.2 oz) 82.8 kg (182 lb 8 oz) 80.4 kg (177 lb 3.2 oz)    Intake/Output: I/O last 3 completed shifts: In: 1974 [P.O.:1490; I.V.:184; IV Piggyback:300] Out: 3300 [Urine:3300]   Intake/Output this shift:  Total I/O In: 1076.4 [P.O.:720; I.V.:56.4; IV Piggyback:300] Out: 925 [Urine:925]  Physical Exam: General: NAD, laying in bed  Head: Normocephalic, atraumatic. Moist oral mucosal membranes  Eyes: Anicteric  Neck: Supple, trachea midline  Lungs:  CTAB, normal effort  Heart: S1S2 2/6 SEM  Abdomen:  Soft, nontender, BS present   Extremities: 1+ bilateral LE edema  Neurologic: Awake, alert, follows commands  Skin: No lesions       Basic Metabolic Panel: Recent Labs  Lab 07/05/17 0726 07/06/17 0549 07/07/17 0336 07/08/17 0630 07/09/17 1050  NA 136 136 135 137 136  K 4.0 3.8 3.6 3.6 3.2*  CL 98* 96* 95* 94* 94*  CO2 30 30 33* 33* 33*  GLUCOSE 144* 151* 137* 147* 165*  BUN 48* 53* 56* 65* 59*  CREATININE 2.96* 3.24* 3.00* 3.06* 2.57*  CALCIUM 8.1* 8.8* 9.0 8.9 9.3  MG  --   --   --   --  2.1    Liver Function Tests: No results for input(s): AST, ALT, ALKPHOS, BILITOT, PROT, ALBUMIN in the last 168 hours. No results for input(s): LIPASE, AMYLASE in the last 168 hours. No results for input(s): AMMONIA in the last 168 hours.  CBC: No results for input(s): WBC,  NEUTROABS, HGB, HCT, MCV, PLT in the last 168 hours.  Cardiac Enzymes: No results for input(s): CKTOTAL, CKMB, CKMBINDEX, TROPONINI in the last 168 hours.  BNP: Invalid input(s): POCBNP  CBG: No results for input(s): GLUCAP in the last 168 hours.  Microbiology: Results for orders placed or performed during the hospital encounter of 06/03/17  Culture, bal-quantitative     Status: Abnormal   Collection Time: 06/03/17  2:48 PM  Result Value Ref Range Status   Specimen Description   Final    BRONCHIAL ALVEOLAR LAVAGE Performed at [facility], [address], [place], [place] [address]    Special Requests   Final    NONE Performed at [facility], [address] [place], [place] [address]    Gram Stain   Final    RARE WBC PRESENT,BOTH PMN AND MONONUCLEAR RARE SQUAMOUS EPITHELIAL CELLS PRESENT NO ORGANISMS SEEN Performed at [facility], [place] [address], [place], [place] [address]    Culture 2,000 COLONIES/mL STAPHYLOCOCCUS AUREUS (A)  Final   Report Status 06/06/2017 FINAL  Final   Organism ID, Bacteria STAPHYLOCOCCUS AUREUS (A)  Final      Susceptibility   Staphylococcus aureus - MIC*    CIPROFLOXACIN <=0.5 SENSITIVE Sensitive     ERYTHROMYCIN <=0.25 SENSITIVE Sensitive     GENTAMICIN <=0.5 SENSITIVE Sensitive  OXACILLIN 0.5 SENSITIVE Sensitive     TETRACYCLINE <=1 SENSITIVE Sensitive     VANCOMYCIN 1 SENSITIVE Sensitive     TRIMETH/SULFA <=10 SENSITIVE Sensitive     CLINDAMYCIN <=0.25 SENSITIVE Sensitive     RIFAMPIN <=0.5 SENSITIVE Sensitive     Inducible Clindamycin NEGATIVE Sensitive     * 2,000 COLONIES/mL STAPHYLOCOCCUS AUREUS  Acid Fast Smear (AFB)     Status: None   Collection Time: 06/03/17  2:48 PM  Result Value Ref Range Status   AFB Specimen Processing Concentration  Final   Acid Fast Smear Negative  Final    Comment: (NOTE) Performed At: [facility] [address] [place], [place] [id number] Rush Farmer MD  FW:[id number]    Source (AFB) BRONCHIAL ALVEOLAR LAVAGE  Final    Comment: Performed at [facility], [address] [place], [place] [address]    Coagulation Studies: No results for input(s): LABPROT, INR in the last 72 hours.  Urinalysis: No results for input(s): COLORURINE, LABSPEC, PHURINE, GLUCOSEU, HGBUR, BILIRUBINUR, KETONESUR, PROTEINUR, UROBILINOGEN, NITRITE, LEUKOCYTESUR in the last 72 hours.  Invalid input(s): APPERANCEUR    Imaging: No results found.   Medications:   . albumin human 25 g (07/10/17 0954)  . furosemide (LASIX) infusion 8 mg/hr (07/10/17 0951)  . potassium chloride 10 mEq (07/10/17 1355)   . amLODipine  10 mg Oral Daily  . atorvastatin  40 mg Oral q1800  . budesonide (PULMICORT) nebulizer solution  0.5 mg Nebulization BID  . docusate sodium  100 mg Oral Daily  . fluticasone furoate-vilanterol  1 puff Inhalation Daily  . folic acid  1 mg Oral Daily  . gabapentin  300 mg Oral BID  . guaiFENesin  600 mg Oral BID  . heparin  5,000 Units Subcutaneous Q8H  . hydrALAZINE  50 mg Oral Q8H  . hydrocortisone  25 mg Rectal BID  . isosorbide mononitrate  60 mg Oral Daily  . [START ON 07/11/2017] metoprolol succinate  50 mg Oral Daily  . pantoprazole  40 mg Oral Daily  . predniSONE  40 mg Oral Q breakfast  . sodium chloride flush  3 mL Intravenous Q12H   acetaminophen **OR** acetaminophen, albuterol, benzonatate, diphenhydrAMINE, guaiFENesin, hydrALAZINE, nitroGLYCERIN, ondansetron **OR** ondansetron (ZOFRAN) IV, polyethylene glycol, sodium chloride flush, sodium chloride flush, witch hazel-glycerin  Assessment/ Plan:  Billy Ortiz is a 77 y.o. black male with lung cancer, hypertension, GERD, hyperlipidemia, COPD, who was admitted to [facility] on 07/02/17.    1. 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 With hemoptysis and hematuria on prior admission.  Chronic kidney disease  secondary to hypertension and bilateral renal cysts.  Negative ANA/ANCA/GBM 05/2017.   -No new renal function testing available today but yesterday creatinine was down to 2.5.  Good urine output noted that 2.7 L over the preceding 24 hours.  Edema significantly improved.  We will go ahead and stop Lasix drip.  2. Lower extremity edema.  Lower extremity edema markedly improved.  Stop Lasix drip and transition the patient to furosemide 80 mg twice daily.  LOS: [place] 5/12/20192:43 PM
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
## 2017-07-10 NOTE — Progress Notes (Signed)
[facility] [place] NAME: Billy Ortiz    MR#:  [id number]  DATE OF BIRTH:  1940-06-30  SUBJECTIVE:  CHIEF COMPLAINT:   Chief Complaint  Patient presents with  . Wheezing  Patient feeling better, continues to complain of cough, noted ventricular tachycardia asymptomatic on yesterday, cardiology input appreciated,  REVIEW OF SYSTEMS:  CONSTITUTIONAL: No fever, fatigue or weakness.  EYES: No blurred or double vision.  EARS, NOSE, AND THROAT: No tinnitus or ear pain.  RESPIRATORY: No cough, shortness of breath, wheezing or hemoptysis.  CARDIOVASCULAR: No chest pain, orthopnea, edema.  GASTROINTESTINAL: No nausea, vomiting, diarrhea or abdominal pain.  GENITOURINARY: No dysuria, hematuria.  ENDOCRINE: No polyuria, nocturia,  HEMATOLOGY: No anemia, easy bruising or bleeding SKIN: No rash or lesion. MUSCULOSKELETAL: No joint pain or arthritis.   NEUROLOGIC: No tingling, numbness, weakness.  PSYCHIATRY: No anxiety or depression.   ROS  DRUG ALLERGIES:   Allergies  Allergen Reactions  . Tramadol Hives  . Lisinopril     Other reaction(s): Unknown Other reaction(s): Unknown    VITALS:  Blood pressure (!) 155/71, pulse 66, temperature 98.6 F (37 C), temperature source Oral, resp. rate 16, height 6\' 4"  (1.93 m), weight 80.4 kg (177 lb 3.2 oz), SpO2 99 %.  PHYSICAL EXAMINATION:  GENERAL:  77 y.o.-year-old patient lying in the bed with no acute distress.  EYES: Pupils equal, round, reactive to light and accommodation. No scleral icterus. Extraocular muscles intact.  HEENT: Head atraumatic, normocephalic. Oropharynx and nasopharynx clear.  NECK:  Supple, no jugular venous distention. No thyroid enlargement, no tenderness.  LUNGS: Normal breath sounds bilaterally, no wheezing, rales,rhonchi or crepitation. No use of accessory muscles of respiration.  CARDIOVASCULAR: S1, S2 normal. No murmurs, rubs, or gallops.  ABDOMEN: Soft, nontender,  nondistended. Bowel sounds present. No organomegaly or mass.  EXTREMITIES: No pedal edema, cyanosis, or clubbing.  NEUROLOGIC: Cranial nerves II through XII are intact. Muscle strength 5/5 in all extremities. Sensation intact. Gait not checked.  PSYCHIATRIC: The patient is alert and oriented x 3.  SKIN: No obvious rash, lesion, or ulcer.   Physical Exam LABORATORY PANEL:   CBC No results for input(s): WBC, HGB, HCT, PLT in the last 168 hours. ------------------------------------------------------------------------------------------------------------------  Chemistries  Recent Labs  Lab 07/09/17 1050  NA 136  K 3.2*  CL 94*  CO2 33*  GLUCOSE 165*  BUN 59*  CREATININE 2.57*  CALCIUM 9.3  MG 2.1   ------------------------------------------------------------------------------------------------------------------  Cardiac Enzymes No results for input(s): TROPONINI in the last 168 hours. ------------------------------------------------------------------------------------------------------------------  RADIOLOGY:  No results found.  ASSESSMENT AND PLAN:  77 year old male patient with history of chronic kidney disease stage IV, squamous cell lung cancer stage III, COPD currently under service for COPD exacerbation and respiratory distress.  - Acute hypoxic respiratory failure secondary to COPD exacerbation improved Resolved Weaned off oxygen Continue prednisone with taper and breathing treatments  - Acute COPD exacerbation resolved Continue prednisone taper with breathing treatments  - Bilateral lower extremity edema  Improved secondary to poor nutrition, hypertension, heart failure, and COPD  poor renal function secondary to hypertension Nephrology input appreciated-continue Lasix drip, strict I&O monitoring, daily weights  -Acute asymptomatic ventricular tachycardia Resolved Cardiology input appreciated-beta-blocker therapy, Imdur, high intensity statin  therapy  - 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 Diagnosed April 2019 Stable Need to follow-up with oncology status post discharge for continued care-plans for chemo/radiation/currently on hold  -CKD stage IV Stable Avoid nephrotoxic agents Monitor  while being diuresed on the Lasix drip  -Uncontrolled hypertension Increase beta-blocker therapy, vitals per routine, make changes as per necessary  -Acute perianal ulcerations with hemorrhoids noted Improved Wound care nurse consulted to evaluate/treat Continue Anusol twice daily for now  DNR DVT prophylaxis subcu heparin Disposition to skilled nursing facility in 1 to 2 days barring any complications  All the records are reviewed and case discussed with Care Management/Social Workerr. Management plans discussed with the patient, family and they are in agreement.  CODE STATUS:dnr  TOTAL TIME TAKING CARE OF THIS PATIENT: 35 minutes.     POSSIBLE D/C IN 1-3 DAYS, DEPENDING ON CLINICAL CONDITION.   Avel Peace Salary M.D on 07/10/2017   Between 7am to 6pm - Pager - [phone]  After 6pm go to [email or web address] - password [email or web address] [facility]  Office  [phone]  CC: Primary care physician; [employer]  Note: This dictation was prepared with Diplomatic Services operational officer dictation along with smaller Company secretary. Any transcriptional errors that result from this process are unintentional.

## 2017-07-11 ENCOUNTER — Inpatient Hospital Stay: Payer: Medicare Other

## 2017-07-11 ENCOUNTER — Ambulatory Visit: Payer: Medicare Other

## 2017-07-11 LAB — BASIC METABOLIC PANEL
Anion gap: 10 (ref 5–15)
BUN: 68 mg/dL — ABNORMAL HIGH (ref 6–20)
CALCIUM: 9.5 mg/dL (ref 8.9–10.3)
CO2: 29 mmol/L (ref 22–32)
CREATININE: 2.59 mg/dL — AB (ref 0.61–1.24)
Chloride: 98 mmol/L — ABNORMAL LOW (ref 101–111)
GFR, EST AFRICAN AMERICAN: 26 mL/min — AB (ref >60)
GFR, EST NON AFRICAN AMERICAN: 22 mL/min — AB (ref >60)
GLUCOSE: 122 mg/dL — AB (ref 65–99)
Potassium: 3.3 mmol/L — ABNORMAL LOW (ref 3.5–5.1)
Sodium: 137 mmol/L (ref 135–145)

## 2017-07-11 LAB — MAGNESIUM: Magnesium: 1.9 mg/dL (ref 1.7–2.4)

## 2017-07-11 MED ORDER — HYDRALAZINE HCL 50 MG PO TABS: 75.0000 mg | ORAL_TABLET | Freq: Three times a day (TID) | ORAL | 0 refills | Status: AC

## 2017-07-11 MED ORDER — HYDRALAZINE HCL 50 MG PO TABS
75.0000 mg | ORAL_TABLET | Freq: Three times a day (TID) | ORAL | Status: DC
  Administered 2017-07-11: 75 mg via ORAL
  Filled 2017-07-11: qty 1

## 2017-07-11 MED ORDER — WITCH HAZEL-GLYCERIN EX PADS: MEDICATED_PAD | CUTANEOUS | 0 refills | Status: AC | PRN

## 2017-07-11 MED ORDER — GERHARDT'S BUTT CREAM: 1.0000 "application " | TOPICAL_CREAM | Freq: Four times a day (QID) | CUTANEOUS | 0 refills | Status: AC

## 2017-07-11 MED ORDER — POTASSIUM CHLORIDE 20 MEQ PO PACK
40.0000 meq | PACK | Freq: Once | ORAL | Status: AC
  Administered 2017-07-11: 40 meq via ORAL
  Filled 2017-07-11: qty 2

## 2017-07-11 MED ORDER — FOLIC ACID 1 MG PO TABS
1.0000 mg | ORAL_TABLET | Freq: Every day | ORAL | 0 refills | Status: AC
Start: 2017-07-12 — End: ?

## 2017-07-11 MED ORDER — HEPARIN SOD (PORK) LOCK FLUSH 100 UNIT/ML IV SOLN: 500.0000 [IU] | Freq: Once | INTRAVENOUS | Status: DC

## 2017-07-11 MED ORDER — BENZONATATE 100 MG PO CAPS
200.0000 mg | ORAL_CAPSULE | Freq: Three times a day (TID) | ORAL | Status: DC | PRN
Start: 2017-07-11 — End: 2017-07-11

## 2017-07-11 MED ORDER — GUAIFENESIN ER 600 MG PO TB12: 600.0000 mg | ORAL_TABLET | Freq: Two times a day (BID) | ORAL | 0 refills | Status: AC

## 2017-07-11 MED ORDER — BENZONATATE 200 MG PO CAPS: 200.0000 mg | ORAL_CAPSULE | Freq: Three times a day (TID) | ORAL | 0 refills | Status: AC | PRN

## 2017-07-11 MED ORDER — GABAPENTIN 100 MG PO CAPS: 100.0000 mg | ORAL_CAPSULE | Freq: Two times a day (BID) | ORAL | 0 refills | Status: AC

## 2017-07-11 MED ORDER — GERHARDT'S BUTT CREAM
TOPICAL_CREAM | Freq: Four times a day (QID) | CUTANEOUS | Status: DC
  Administered 2017-07-11: 13:00:00 via TOPICAL
  Filled 2017-07-11 (×2): qty 1

## 2017-07-11 MED ORDER — HEPARIN SOD (PORK) LOCK FLUSH 100 UNIT/ML IV SOLN
500.0000 [IU] | Freq: Once | INTRAVENOUS | Status: DC
  Filled 2017-07-11: qty 5

## 2017-07-11 MED ORDER — AMLODIPINE BESYLATE 10 MG PO TABS: 10.0000 mg | ORAL_TABLET | Freq: Every day | ORAL | 0 refills | Status: AC

## 2017-07-11 MED ORDER — METOPROLOL SUCCINATE ER 50 MG PO TB24: 50.0000 mg | ORAL_TABLET | Freq: Every day | ORAL | 0 refills | Status: AC

## 2017-07-11 MED ORDER — PREDNISONE 20 MG PO TABS: 40.0000 mg | ORAL_TABLET | Freq: Every day | ORAL | 0 refills | Status: DC

## 2017-07-11 MED ORDER — FUROSEMIDE 80 MG PO TABS: 80.0000 mg | ORAL_TABLET | Freq: Two times a day (BID) | ORAL | 0 refills | Status: AC

## 2017-07-11 NOTE — Progress Notes (Signed)
[facility]  ROUNDING NOTE   Subjective:   UOP 2550  Furosemide 70m PO bid  Continues to complain of orthopnea. Productive cough.   Objective:  Vital signs in last 24 hours:  Temp:  [98.2 F (36.8 C)-98.7 F (37.1 C)] 98.2 F (36.8 C) (05/13 0734) Pulse Rate:  [61-66] 61 (05/13 0734) Resp:  [16] 16 (05/13 0734) BP: (148-177)/(73-81) 159/75 (05/13 0734) SpO2:  [94 %-98 %] 94 % (05/13 0734) Weight:  [82 kg (180 lb 12.8 oz)] 82 kg (180 lb 12.8 oz) (05/13 0420)  Weight change: 1.633 kg (3 lb 9.6 oz) Filed Weights   07/09/17 0500 07/10/17 0421 07/11/17 0420  Weight: 82.8 kg (182 lb 8 oz) 80.4 kg (177 lb 3.2 oz) 82 kg (180 lb 12.8 oz)    Intake/Output: I/O last 3 completed shifts: In: 1649.6 [P.O.:1080; I.V.:169.6; IV Piggyback:400] Out: 4200 [Urine:4200]   Intake/Output this shift:  No intake/output data recorded.  Physical Exam: General: NAD, laying in bed  Head: Normocephalic, atraumatic. Moist oral mucosal membranes  Eyes: Anicteric  Neck: Supple, trachea midline  Lungs:  Bibasilar rales  Heart: Regular, +murmur  Abdomen:  Soft, nontender, BS present   Extremities: no bilateral LE edema  Neurologic: Awake, alert, follows commands  Skin: No lesions       Basic Metabolic Panel: Recent Labs  Lab 07/05/17 0726 07/06/17 0549 07/07/17 0336 07/08/17 0630 07/09/17 1050  NA 136 136 135 137 136  K 4.0 3.8 3.6 3.6 3.2*  CL 98* 96* 95* 94* 94*  CO2 30 30 33* 33* 33*  GLUCOSE 144* 151* 137* 147* 165*  BUN 48* 53* 56* 65* 59*  CREATININE 2.96* 3.24* 3.00* 3.06* 2.57*  CALCIUM 8.1* 8.8* 9.0 8.9 9.3  MG  --   --   --   --  2.1    Liver Function Tests: No results for input(s): AST, ALT, ALKPHOS, BILITOT, PROT, ALBUMIN in the last 168 hours. No results for input(s): LIPASE, AMYLASE in the last 168 hours. No results for input(s): AMMONIA in the last 168 hours.  CBC: Recent Labs  Lab 07/10/17 1502  PLT 196    Cardiac Enzymes: No results for  input(s): CKTOTAL, CKMB, CKMBINDEX, TROPONINI in the last 168 hours.  BNP: Invalid input(s): POCBNP  CBG: No results for input(s): GLUCAP in the last 168 hours.  Microbiology: Results for orders placed or performed during the hospital encounter of 06/03/17  Culture, bal-quantitative     Status: Abnormal   Collection Time: 06/03/17  2:48 PM  Result Value Ref Range Status   Specimen Description   Final    BRONCHIAL ALVEOLAR LAVAGE Performed at [facility] [address], [place] [address]   Special Requests   Final    NONE [facility]formed at [facility] [address], [place] [address]   Gram Stain   Final    RARE WBC PRESENT,BOTH PMN AND MONONUCLEAR RARE SQUAMOUS EPITHELIAL CELLS PRESENT NO ORGANISMS SEEN Performed at [facility] [address], [place]   Culture 2,000 COLONIES/mL STAPHYLOCOCCUS AUREUS (A)  Final   Report Status 06/06/2017 FINAL  Final   Organism ID, Bacteria STAPHYLOCOCCUS AUREUS (A)  Final      Susceptibility   Staphylococcus aureus - MIC*    CIPROFLOXACIN <=0.5 SENSITIVE Sensitive     ERYTHROMYCIN <=0.25 SENSITIVE Sensitive     GENTAMICIN <=0.5 SENSITIVE Sensitive     OXACILLIN 0.5 SENSITIVE Sensitive  TETRACYCLINE <=1 SENSITIVE Sensitive     VANCOMYCIN 1 SENSITIVE Sensitive     TRIMETH/SULFA <=10 SENSITIVE Sensitive     CLINDAMYCIN <=0.25 SENSITIVE Sensitive     RIFAMPIN <=0.5 SENSITIVE Sensitive     Inducible Clindamycin NEGATIVE Sensitive     * 2,000 COLONIES/mL STAPHYLOCOCCUS AUREUS  Acid Fast Smear (AFB)     Status: None   Collection Time: 06/03/17  2:48 PM  Result Value Ref Range Status   AFB Specimen Processing Concentration  Final   Acid Fast Smear Negative  Final    Comment: (NOTE) Performed At: [facility] [address] [place], [place] [id number] Rush Farmer MD ID:[id number]    Source (AFB) BRONCHIAL ALVEOLAR LAVAGE  Final    Comment: Performed at  [facility], [address] [place], [place] [address]    Coagulation Studies: No results for input(s): LABPROT, INR in the last 72 hours.  Urinalysis: No results for input(s): COLORURINE, LABSPEC, PHURINE, GLUCOSEU, HGBUR, BILIRUBINUR, KETONESUR, PROTEINUR, UROBILINOGEN, NITRITE, LEUKOCYTESUR in the last 72 hours.  Invalid input(s): APPERANCEUR    Imaging: No results found.   Medications:    . amLODipine  10 mg Oral Daily  . atorvastatin  40 mg Oral q1800  . budesonide (PULMICORT) nebulizer solution  0.5 mg Nebulization BID  . docusate sodium  100 mg Oral Daily  . fluticasone furoate-vilanterol  1 puff Inhalation Daily  . folic acid  1 mg Oral Daily  . furosemide  80 mg Oral BID  . gabapentin  300 mg Oral BID  . Gerhardt's butt cream   Topical QID  . guaiFENesin  600 mg Oral BID  . heparin  5,000 Units Subcutaneous Q8H  . hydrALAZINE  75 mg Oral Q8H  . isosorbide mononitrate  60 mg Oral Daily  . metoprolol succinate  50 mg Oral Daily  . pantoprazole  40 mg Oral Daily  . predniSONE  40 mg Oral Q breakfast  . sodium chloride flush  3 mL Intravenous Q12H   acetaminophen **OR** acetaminophen, albuterol, benzonatate, diphenhydrAMINE, guaiFENesin, hydrALAZINE, nitroGLYCERIN, ondansetron **OR** ondansetron (ZOFRAN) IV, polyethylene glycol, sodium chloride flush, sodium chloride flush, witch hazel-glycerin  Assessment/ Plan:  [name] is a 77 y.o. black male with lung cancer, hypertension, GERD, hyperlipidemia, COPD, who was admitted to [facility] on 07/02/17.  Complicated hospital course requiring renal replacement therapy.   1. 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  With hemoptysis and hematuria on prior admission.  Chronic kidney disease secondary to hypertension and bilateral renal cysts. Negative serologic work up on 05/31/17.  Acute renal failure seems to be prerenal  azotemia that has progressed to ATN. Nonoliguric urine output.  PO furosemide.  - Continue to monitor volume status. Labs pending.   2. Hypertension: elevated.  - furosemide, amlodipine, hydralazine, metoprolol, isosorbide mononitrate  3. Acute respiratory failure on [place]: with acute exacerbation of COPD, lung cancer - prednisone  LOS: 9 Zerenity Bowron 5/13/201910:12 AM

## 2017-07-11 NOTE — Consult Note (Signed)
[facility] Nurse wound consult note Reason for Consult:Patient consult for perineal irritation, he reports that this "comes and goes", "gets better and then worse again" over the past month or so. He is with neoplasm of the lung, COPD. Wound type:Pinpoint, scattered areas of partial thickness tissue loss.  No pattern or shape consistent with herpetic lesions Pressure Injury POA: Yes/No/NA Measurement: Immediate perianal area is affected Wound bed: pink, moist Drainage (amount, consistency, odor) scant serous Periwound: slightly darker in skin tone than the surrounding area Dressing procedure/placement/frequency: I will implement Gerhart's Butt cream, a prescriptive, compounded preparation consisting of hydrocortisone, antifungal and zinc oxide. Patient will be encouraged to assume a side lying position while in bed and minimize the supine position for promotion of air flow.. A pressure redistribution chair cushion is provided for his use when OOB in the chair and for home use post discharge.  [facility] nursing team will not follow, but will remain available to this patient, the nursing and medical teams.  Please re-consult if needed. Thanks, Maudie Flakes, MSN, RN, [place], Arther Abbott  Pager# [phone]

## 2017-07-11 NOTE — Clinical Social Work Note (Addendum)
CSW spoke with patient's son [name]. [phone], and they have decided to go to SNF.  CSW presented bed offers again, and he chose [facility].  Patient to be d/c'ed today to [facility], room 5.  Patient and family agreeable to plans will transport via son's car RN to call report to [phone].  Patient's son is aware that patient will be discharging today, he will transport patient.  Evette Cristal, MSW, [place]

## 2017-07-11 NOTE — Progress Notes (Signed)
Discharge instructions explained to pt and pts son/ verbalized an understanding/ iv and tele removed/ port deaccsessed / report called to [facility]/ son to transport

## 2017-07-11 NOTE — Discharge Summary (Signed)
[facility] [place] NAME: Billy Ortiz    MR#:  [id number]  DATE OF BIRTH:  26-Sep-1940  DATE OF ADMISSION:  07/02/2017 ADMITTING PHYSICIAN: Hillary Bow, MD  DATE OF DISCHARGE: No discharge date for patient encounter.  PRIMARY CARE PHYSICIAN: [facility] [place]    ADMISSION DIAGNOSIS:  Chronic obstructive pulmonary disease with acute exacerbation (HCC) [J44.1] Dyspnea, unspecified type [R06.00] Congestive heart failure, unspecified HF chronicity, unspecified heart failure type ([place]) [I50.9]  DISCHARGE DIAGNOSIS:  Active Problems:   COPD (chronic obstructive pulmonary disease) (HCC)   Malignant neoplasm of lung (HCC)   Dyspnea   Acute renal failure (HCC)   Folate deficiency   Congestive heart failure ([place])   SECONDARY DIAGNOSIS:   Past Medical History:  Diagnosis Date  . Cancer [facility])    prostate  . Cataract   . Elevated lipids   . GERD (gastroesophageal reflux disease)   . HOH (hard of hearing)   . Hypertension   . Pneumonia   . Pulmonary nodules/lesions, multiple   . Shortness of breath dyspnea     HOSPITAL COURSE:  77 year old male patient with history of chronic kidney disease stage IV, squamous cell lung cancer stage III, COPD currently under service for COPD exacerbation and respiratory distress.  - Acute hypoxic respiratory failure secondary to COPD exacerbation improved Resolved Weaned off oxygen Continue prednisone with taperand breathing treatments  - Acute COPD exacerbation resolved Continue prednisone taper with breathing treatments  - Bilateral lower extremity edema Much improved secondary topoor nutrition, hypertension, heart failure,andCOPD poor renal function secondary to hypertension Nephrology input appreciated-treated with Lasix drip - converted to po lasix  -Acute asymptomatic ventricular tachycardia Resolved Cardiology input appreciated-beta-blocker therapy,  Imdur, high intensity statin therapy  - 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 Tramadol Hives  . Lisinopril     Other reaction(s): Unknown Other reaction(s): Unknown    DISCHARGE MEDICATIONS:   Allergies as of 07/11/2017      Reactions   Tramadol Hives   Lisinopril    Other reaction(s): Unknown Other reaction(s): Unknown      Medication List    STOP taking these medications   cefdinir 300 MG capsule Commonly known as:  OMNICEF   guaiFENesin 100 MG/5ML Soln Commonly known as:  ROBITUSSIN Replaced by:  guaiFENesin 600 MG 12 hr tablet   torsemide 20 MG tablet Commonly known as:  DEMADEX     TAKE these medications   albuterol 108 (90 Base) MCG/ACT inhaler Commonly known as:  PROVENTIL HFA;VENTOLIN HFA Inhale 2 puffs into the lungs every 6 (six) hours as needed for wheezing or shortness of breath.   amLODipine 10 MG tablet Commonly known as:  NORVASC Take 1 tablet (10 mg total) by mouth daily. Start taking on:  07/12/2017 What changed:    medication strength  how much to take   atorvastatin 40 MG tablet Commonly known as:  LIPITOR Take  40 mg by mouth daily at 6 PM.   benzonatate 200 MG capsule Commonly known as:  TESSALON Take 1 capsule (200 mg total) by mouth 3 (three) times daily as needed for cough.   BREO ELLIPTA 100-25 MCG/INH Aepb Generic drug:   fluticasone furoate-vilanterol Inhale 1 puff into the lungs daily.   docusate sodium 100 MG capsule Commonly known as:  COLACE Take 100 mg by mouth daily.   folic acid 1 MG tablet Commonly known as:  FOLVITE Take 1 tablet (1 mg total) by mouth daily. Start taking on:  07/12/2017   furosemide 80 MG tablet Commonly known as:  LASIX Take 1 tablet (80 mg total) by mouth 2 (two) times daily.   gabapentin 100 MG capsule Commonly known as:  NEURONTIN Take 1 capsule (100 mg total) by mouth 2 (two) times daily.   Gerhardt's butt cream Crea Apply 1 application topically 4 (four) times daily.   guaiFENesin 600 MG 12 hr tablet Commonly known as:  MUCINEX Take 1 tablet (600 mg total) by mouth 2 (two) times daily. Replaces:  guaiFENesin 100 MG/5ML Soln   hydrALAZINE 50 MG tablet Commonly known as:  APRESOLINE Take 1.5 tablets (75 mg total) by mouth every 8 (eight) hours.   isosorbide mononitrate 60 MG 24 hr tablet Commonly known as:  IMDUR Take 60 mg by mouth daily.   metoprolol succinate 50 MG 24 hr tablet Commonly known as:  TOPROL-XL Take 1 tablet (50 mg total) by mouth daily. Take with or immediately following a meal. Start taking on:  07/12/2017   nitroGLYCERIN 0.4 MG SL tablet Commonly known as:  NITROSTAT Place 0.4 mg under the tongue every 5 (five) minutes as needed for chest pain.   omeprazole 20 MG capsule Commonly known as:  PRILOSEC Take 20 mg by mouth 2 (two) times daily before a meal.   predniSONE 20 MG tablet Commonly known as:  DELTASONE Take 2 tablets (40 mg total) by mouth daily with breakfast. Start taking on:  07/12/2017 What changed:    medication strength  how much to take  how to take this  when to take this  additional instructions   vitamin B-12 500 MCG tablet Commonly known as:  CYANOCOBALAMIN Take 1,000 mcg by mouth daily.   witch hazel-glycerin pad Commonly known as:  TUCKS Apply topically as needed for itching.        DISCHARGE  INSTRUCTIONS:  If you experience worsening of your admission symptoms, develop shortness of breath, life threatening emergency, suicidal or homicidal thoughts you must seek medical attention immediately by calling 911 or calling your MD immediately  if symptoms less severe.  You Must read complete instructions/literature along with all the possible adverse reactions/side effects for all the Medicines you take and that have been prescribed to you. Take any new Medicines after you have completely understood and accept all the possible adverse reactions/side effects.   Please note  You were cared for by a hospitalist during your hospital stay. If you have any questions about your discharge medications or the care you received while you were in the hospital after you are discharged, you can call the unit and asked to speak with the hospitalist on call if the hospitalist that took care of you is not available. Once you are discharged, your primary care physician will handle any further medical issues. Please note that NO REFILLS for any discharge medications will be authorized once you are discharged, as it is imperative that you return to  your primary care physician (or establish a relationship with a primary care physician if you do not have one) for your aftercare needs so that they can reassess your need for medications and monitor your lab values.    Today   CHIEF COMPLAINT:   Chief Complaint  Patient presents with  . Wheezing    HISTORY OF PRESENT ILLNESS:  77 y.o. 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  Patient was recently in the hospital for dehydration.  He also had worsening lower extremity edema and his torsemide was changed from 3 times a week to daily use.  He has followed up with oncology and  nephrology as outpatient.  Son at the bedside expresses concern that patient likely has missed many medications recently.  Here in the emergency room patient's chest x-ray continues to show severe COPD.  No pulmonary edema.  He has significant lower committee edema.  Audible wheezing and short of breath.  Saturations on oxygen 88% on room air. Patient mentions that he quit smoking since he was admitted last time in the hospital.  Lives with his sister who is 40 years old. He is due to start radiation treatment in 4 days on Wednesday.     VITAL SIGNS:  Blood pressure (!) 159/75, pulse 61, temperature 98.2 F (36.8 C), temperature source Oral, resp. rate 16, height _0  (1.93 m), weight 82 kg (180 lb 12.8 oz), SpO2 94 %.  I/O:    Intake/Output Summary (Last 24 hours) at 07/11/2017 1332 Last data filed at 07/11/2017 1036 Gross per 24 hour  Intake 1453.6 ml  Output 1950 ml  Net -496.4 ml    PHYSICAL EXAMINATION:  GENERAL:  77 y.o.-year-old patient lying in the bed with no acute distress.  EYES: Pupils equal, round, reactive to light and accommodation. No scleral icterus. Extraocular muscles intact.  HEENT: Head atraumatic, normocephalic. Oropharynx and nasopharynx clear.  NECK:  Supple, no jugular venous distention. No thyroid enlargement, no tenderness.  LUNGS: Normal breath sounds bilaterally, no wheezing, rales,rhonchi or crepitation. No use of accessory muscles of respiration.  CARDIOVASCULAR: S1, S2 normal. No murmurs, rubs, or gallops.  ABDOMEN: Soft, non-tender, non-distended. Bowel sounds present. No organomegaly or mass.  EXTREMITIES: No pedal edema, cyanosis, or clubbing.  NEUROLOGIC: Cranial nerves II through XII are intact. Muscle strength 5/5 in all extremities. Sensation intact. Gait not checked.  PSYCHIATRIC: The patient is alert and oriented x 3.  SKIN: No obvious rash, lesion, or ulcer.   DATA REVIEW:   CBC Recent Labs  Lab 07/10/17 1502  PLT 196    Chemistries   Recent Labs  Lab 07/11/17 1044  NA 137  K 3.3*  CL 98*  CO2 29  GLUCOSE 122*  BUN 68*  CREATININE 2.59*  CALCIUM 9.5  MG 1.9    Cardiac Enzymes No results for input(s): TROPONINI in the last 168 hours.  Microbiology Results  Results for orders placed or performed during the hospital encounter of 06/03/17  Culture, bal-quantitative     Status: Abnormal   Collection Time: 06/03/17  2:48 PM  Result Value Ref Range Status   Specimen Description   Final    BRONCHIAL ALVEOLAR LAVAGE Performed at [facility], [address], [place], [place] [address]    Special Requests   Final    NONE Performed at  [place], [place] [address]    Gram Stain   Final    RARE WBC PRESENT,BOTH PMN AND MONONUCLEAR RARE SQUAMOUS EPITHELIAL CELLS PRESENT NO ORGANISMS SEEN Performed at [facility], [place] [address], [place], [place] [address]    Culture 2,000 COLONIES/mL STAPHYLOCOCCUS AUREUS (A)  Final   Report Status 06/06/2017 FINAL  Final   Organism ID, Bacteria STAPHYLOCOCCUS AUREUS (A)  Final      Susceptibility   Staphylococcus aureus - MIC*    CIPROFLOXACIN <=0.5 SENSITIVE Sensitive     ERYTHROMYCIN <=0.25 SENSITIVE Sensitive     GENTAMICIN <=0.5 SENSITIVE Sensitive     OXACILLIN 0.5 SENSITIVE Sensitive     TETRACYCLINE <=1 SENSITIVE Sensitive     VANCOMYCIN 1 SENSITIVE Sensitive     TRIMETH/SULFA <=10 SENSITIVE Sensitive     CLINDAMYCIN <=0.25 SENSITIVE Sensitive     RIFAMPIN <=0.5 SENSITIVE Sensitive     Inducible Clindamycin NEGATIVE Sensitive     * 2,000 COLONIES/mL STAPHYLOCOCCUS AUREUS  Acid Fast Smear (AFB)     Status: None   Collection Time: 06/03/17  2:48 PM  Result Value Ref Range Status   AFB Specimen Processing Concentration  Final   Acid Fast Smear Negative  Final    Comment: (NOTE) Performed At: [facility] [address] [place], [place] [id number] Rush Farmer MD ID:[id number]    Source (AFB)  BRONCHIAL ALVEOLAR LAVAGE  Final    Comment: Performed at [facility], [address], [place], [place] [address]    RADIOLOGY:  No results found.  EKG:   Orders placed or performed during the hospital encounter of 07/02/17  . ED EKG within 10 minutes  . ED EKG within 10 minutes      Management plans discussed with the patient, family and they are in agreement.  CODE STATUS:     Code Status Orders  (From admission, onward)        Start     Ordered   07/02/17 1301  Do not attempt resuscitation (DNR)  Continuous    Question Answer Comment  In the event of cardiac or respiratory ARREST Do not call a "code blue"   In the event of cardiac or respiratory ARREST Do not perform Intubation, CPR, defibrillation or ACLS   In the event of cardiac or respiratory ARREST Use medication by any route, position, wound care, and other measures to relive pain and suffering. May use oxygen, suction and manual treatment of airway obstruction as needed for comfort.      07/02/17 1301    Code Status History    Date Active Date Inactive Code Status Order ID Comments User Context   06/21/2017 0033 06/26/2017 1514 Full Code [id number]  Lance Coon, MD ED   05/26/2016 1604 05/26/2016 2031 Full Code [id number]  Corey Skains, MD Inpatient    Advance Directive Documentation     Most Recent Value  Type of Advance Directive  Healthcare Power of Attorney  Pre-existing out of facility DNR order (yellow form or pink MOST form)  -  "MOST" Form in Place?  -      TOTAL TIME TAKING CARE OF THIS PATIENT: 45 minutes.    Avel Peace Cookie Pore M.D on 07/11/2017 at 1:32 PM  Between 7am to 6pm - Pager - [phone]  After 6pm go to [email or web address] - password [email or web address] [place] [facility]  Office  [phone]  CC: Primary care physician; [employer]  Note: This dictation was prepared with Dragon dictation along with smaller phrase technology. Any transcriptional  errors that result from this process are unintentional.

## 2017-07-11 NOTE — Care Management Important Message (Signed)
Copy of signed IM left in patient's room.    

## 2017-07-11 NOTE — Progress Notes (Signed)
[place] [facility] [place] NAME: Billy Ortiz    MR#:  [id number]  DATE OF BIRTH:  [birth year]  SUBJECTIVE:  CHIEF COMPLAINT:   Chief Complaint  Patient presents with  . Wheezing  no complaints, for SNF when bed is available  REVIEW OF SYSTEMS:  CONSTITUTIONAL: No fever, fatigue or weakness.  EYES: No blurred or double vision.  EARS, NOSE, AND THROAT: No tinnitus or ear pain.  RESPIRATORY: No cough, shortness of breath, wheezing or hemoptysis.  CARDIOVASCULAR: No chest pain, orthopnea, edema.  GASTROINTESTINAL: No nausea, vomiting, diarrhea or abdominal pain.  GENITOURINARY: No dysuria, hematuria.  ENDOCRINE: No polyuria, nocturia,  HEMATOLOGY: No anemia, easy bruising or bleeding SKIN: No rash or lesion. MUSCULOSKELETAL: No joint pain or arthritis.   NEUROLOGIC: No tingling, numbness, weakness.  PSYCHIATRY: No anxiety or depression.   ROS  DRUG ALLERGIES:   Allergies  Allergen Reactions  . Tramadol Hives  . Lisinopril     Other reaction(s): Unknown Other reaction(s): Unknown    VITALS:  Blood pressure (!) 159/75, pulse 61, temperature 98.2 F (36.8 C), temperature source Oral, resp. rate 16, height 6\' 4"  (1.93 m), weight 82 kg (180 lb 12.8 oz), SpO2 94 %.  PHYSICAL EXAMINATION:  GENERAL:  77 y.o.-year-old patient lying in the bed with no acute distress.  EYES: Pupils equal, round, reactive to light and accommodation. No scleral icterus. Extraocular muscles intact.  HEENT: Head atraumatic, normocephalic. Oropharynx and nasopharynx clear.  NECK:  Supple, no jugular venous distention. No thyroid enlargement, no tenderness.  LUNGS: Normal breath sounds bilaterally, no wheezing, rales,rhonchi or crepitation. No use of accessory muscles of respiration.  CARDIOVASCULAR: S1, S2 normal. No murmurs, rubs, or gallops.  ABDOMEN: Soft, nontender, nondistended. Bowel sounds present. No organomegaly or mass.  EXTREMITIES: No pedal edema,  cyanosis, or clubbing.  NEUROLOGIC: Cranial nerves II through XII are intact. Muscle strength 5/5 in all extremities. Sensation intact. Gait not checked.  PSYCHIATRIC: The patient is alert and oriented x 3.  SKIN: No obvious rash, lesion, or ulcer.   Physical Exam LABORATORY PANEL:   CBC Recent Labs  Lab 07/10/17 1502  PLT 196   ------------------------------------------------------------------------------------------------------------------  Chemistries  Recent Labs  Lab 07/11/17 1044  NA 137  K 3.3*  CL 98*  CO2 29  GLUCOSE 122*  BUN 68*  CREATININE 2.59*  CALCIUM 9.5  MG 1.9   ------------------------------------------------------------------------------------------------------------------  Cardiac Enzymes No results for input(s): TROPONINI in the last 168 hours. ------------------------------------------------------------------------------------------------------------------  RADIOLOGY:  No results found.  ASSESSMENT AND PLAN:  77 year old male patient with history of chronic kidney disease stage IV, squamous cell lung cancer stage III, COPD currently under service for COPD exacerbation and respiratory distress.  - Acute hypoxic respiratory failure secondary to COPD exacerbation improved Resolved Weaned off oxygen Continue prednisone with taper and breathing treatments  - Acute COPD exacerbation resolved Continue prednisone taper with breathing treatments  - Bilateral lower extremity edema Much improved secondary topoor nutrition, hypertension, heart failure, and COPD poor renal function secondary to hypertension Nephrology input appreciated-treated with Lasix drip - converted to po lasix, continue strict I&O monitoring, daily weights  -Acute asymptomatic ventricular tachycardia Resolved Cardiology input appreciated-beta-blocker therapy, Imdur, high intensity statin therapy  - 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 Diagnosed April  2019 Stable Needs to follow-up with oncology status post discharge for continued care-plans for chemo/radiation/currently on hold  -CKD stage IV Stable Avoid nephrotoxic agents  -Uncontrolled hypertension Improved Increase BB and  hydralazine, vitals per routine, and make changes as per necessary  -Acute perianal ulcerations with hemorrhoids noted Improved Wound care nurse consulted to evaluate/treat Treated with Anusol  DNR DVT prophylaxis subcu heparin Disposition to skilled nursing facility when bed available barring any complications Case discussed with the patient's son with all questions answered   All the records are reviewed and case discussed with Care Management/Social Workerr. Management plans discussed with the patient, family and they are in agreement.  CODE STATUS: dnr  TOTAL TIME TAKING CARE OF THIS PATIENT: 35 minutes.     POSSIBLE D/C IN 1-2 DAYS, DEPENDING ON CLINICAL CONDITION.   Avel Peace Ignatius Kloos M.D on 07/11/2017   Between 7am to 6pm - Pager - [phone]  After 6pm go to [email or web address] - password [email or web address] [place] [facility]  Office  [phone]  CC: Primary care physician; [employer], [employer]  Note: This dictation was prepared with Diplomatic Services operational officer dictation along with smaller Company secretary. Any transcriptional errors that result from this process are unintentional.

## 2017-07-12 ENCOUNTER — Inpatient Hospital Stay: Payer: Medicare Other

## 2017-07-12 ENCOUNTER — Ambulatory Visit: Payer: Medicare Other

## 2017-07-12 ENCOUNTER — Telehealth: Payer: Self-pay | Admitting: *Deleted

## 2017-07-12 NOTE — Telephone Encounter (Signed)
Spoke to patient's son via telephone and confirmed that our Lucianne Lei will pick up patient at [facility] tomorrow for his appointment at the [facility], according to [facility].    dhs

## 2017-07-13 ENCOUNTER — Ambulatory Visit
Admission: RE | Admit: 2017-07-13 | Discharge: 2017-07-13 | Disposition: A | Discharge status: Home / Self Care | Payer: Medicare Other | Source: Ambulatory Visit | Attending: Radiation Oncology | Admitting: Radiation Oncology

## 2017-07-13 ENCOUNTER — Ambulatory Visit: Payer: Medicare Other

## 2017-07-13 ENCOUNTER — Inpatient Hospital Stay: Payer: Medicare Other

## 2017-07-13 DIAGNOSIS — F17210 Nicotine dependence, cigarettes, uncomplicated: Secondary | ICD-10-CM | POA: Diagnosis not present

## 2017-07-13 DIAGNOSIS — C3492 Malignant neoplasm of unspecified part of left bronchus or lung: Secondary | ICD-10-CM | POA: Diagnosis present

## 2017-07-13 DIAGNOSIS — Z510 Encounter for antineoplastic radiation therapy: Secondary | ICD-10-CM | POA: Diagnosis not present

## 2017-07-14 ENCOUNTER — Ambulatory Visit
Admission: RE | Admit: 2017-07-14 | Discharge: 2017-07-14 | Disposition: A | Discharge status: Home / Self Care | Payer: Medicare Other | Source: Ambulatory Visit | Attending: Radiation Oncology | Admitting: Radiation Oncology

## 2017-07-14 ENCOUNTER — Ambulatory Visit: Payer: Medicare Other

## 2017-07-14 ENCOUNTER — Inpatient Hospital Stay: Payer: Medicare Other

## 2017-07-14 DIAGNOSIS — Z510 Encounter for antineoplastic radiation therapy: Secondary | ICD-10-CM | POA: Diagnosis not present

## 2017-07-15 ENCOUNTER — Inpatient Hospital Stay: Payer: Medicare Other

## 2017-07-15 ENCOUNTER — Telehealth: Payer: Self-pay

## 2017-07-15 ENCOUNTER — Ambulatory Visit
Admission: RE | Admit: 2017-07-15 | Discharge: 2017-07-15 | Disposition: A | Discharge status: Home / Self Care | Payer: Medicare Other | Source: Ambulatory Visit | Attending: Radiation Oncology | Admitting: Radiation Oncology

## 2017-07-15 ENCOUNTER — Inpatient Hospital Stay (HOSPITAL_BASED_OUTPATIENT_CLINIC_OR_DEPARTMENT_OTHER): Payer: Medicare Other | Admitting: Oncology

## 2017-07-15 ENCOUNTER — Encounter: Payer: Self-pay | Admitting: *Deleted

## 2017-07-15 ENCOUNTER — Telehealth: Payer: Self-pay | Admitting: *Deleted

## 2017-07-15 ENCOUNTER — Ambulatory Visit: Payer: Medicare Other

## 2017-07-15 ENCOUNTER — Encounter: Payer: Self-pay | Admitting: Oncology

## 2017-07-15 VITALS — BP 145/69 | HR 56 | Temp 97.6°F | Resp 20 | Ht 76.0 in | Wt 183.8 lb

## 2017-07-15 DIAGNOSIS — D509 Iron deficiency anemia, unspecified: Secondary | ICD-10-CM | POA: Diagnosis not present

## 2017-07-15 DIAGNOSIS — J9611 Chronic respiratory failure with hypoxia: Secondary | ICD-10-CM | POA: Diagnosis not present

## 2017-07-15 DIAGNOSIS — Z87891 Personal history of nicotine dependence: Secondary | ICD-10-CM | POA: Diagnosis not present

## 2017-07-15 DIAGNOSIS — Z79899 Other long term (current) drug therapy: Secondary | ICD-10-CM | POA: Diagnosis not present

## 2017-07-15 DIAGNOSIS — C3492 Malignant neoplasm of unspecified part of left bronchus or lung: Secondary | ICD-10-CM | POA: Diagnosis not present

## 2017-07-15 DIAGNOSIS — F17210 Nicotine dependence, cigarettes, uncomplicated: Secondary | ICD-10-CM | POA: Diagnosis not present

## 2017-07-15 DIAGNOSIS — Z8546 Personal history of malignant neoplasm of prostate: Secondary | ICD-10-CM | POA: Diagnosis not present

## 2017-07-15 DIAGNOSIS — N184 Chronic kidney disease, stage 4 (severe): Secondary | ICD-10-CM

## 2017-07-15 DIAGNOSIS — Z5111 Encounter for antineoplastic chemotherapy: Secondary | ICD-10-CM

## 2017-07-15 DIAGNOSIS — K219 Gastro-esophageal reflux disease without esophagitis: Secondary | ICD-10-CM

## 2017-07-15 DIAGNOSIS — R5383 Other fatigue: Secondary | ICD-10-CM | POA: Diagnosis not present

## 2017-07-15 DIAGNOSIS — J441 Chronic obstructive pulmonary disease with (acute) exacerbation: Secondary | ICD-10-CM | POA: Diagnosis not present

## 2017-07-15 DIAGNOSIS — Z800 Family history of malignant neoplasm of digestive organs: Secondary | ICD-10-CM

## 2017-07-15 DIAGNOSIS — Z510 Encounter for antineoplastic radiation therapy: Secondary | ICD-10-CM | POA: Diagnosis not present

## 2017-07-15 DIAGNOSIS — R531 Weakness: Secondary | ICD-10-CM | POA: Diagnosis not present

## 2017-07-15 DIAGNOSIS — I129 Hypertensive chronic kidney disease with stage 1 through stage 4 chronic kidney disease, or unspecified chronic kidney disease: Secondary | ICD-10-CM

## 2017-07-15 DIAGNOSIS — Z8701 Personal history of pneumonia (recurrent): Secondary | ICD-10-CM

## 2017-07-15 DIAGNOSIS — R600 Localized edema: Secondary | ICD-10-CM

## 2017-07-15 DIAGNOSIS — Z9981 Dependence on supplemental oxygen: Secondary | ICD-10-CM

## 2017-07-15 DIAGNOSIS — E785 Hyperlipidemia, unspecified: Secondary | ICD-10-CM

## 2017-07-15 LAB — COMPREHENSIVE METABOLIC PANEL
ALK PHOS: 62 U/L (ref 38–126)
ALT: 60 U/L (ref 17–63)
AST: 19 U/L (ref 15–41)
Albumin: 4.2 g/dL (ref 3.5–5.0)
Anion gap: 11 (ref 5–15)
BILIRUBIN TOTAL: 0.9 mg/dL (ref 0.3–1.2)
BUN: 77 mg/dL — ABNORMAL HIGH (ref 6–20)
CALCIUM: 10.5 mg/dL — AB (ref 8.9–10.3)
CO2: 26 mmol/L (ref 22–32)
CREATININE: 2.79 mg/dL — AB (ref 0.61–1.24)
Chloride: 101 mmol/L (ref 101–111)
GFR, EST AFRICAN AMERICAN: 24 mL/min — AB (ref >60)
GFR, EST NON AFRICAN AMERICAN: 21 mL/min — AB (ref >60)
Glucose, Bld: 124 mg/dL — ABNORMAL HIGH (ref 65–99)
Potassium: 3.9 mmol/L (ref 3.5–5.1)
SODIUM: 138 mmol/L (ref 135–145)
TOTAL PROTEIN: 7 g/dL (ref 6.5–8.1)

## 2017-07-15 LAB — CBC WITH DIFFERENTIAL/PLATELET
BASOS PCT: 0 %
Basophils Absolute: 0 10*3/uL (ref 0–0.1)
EOS ABS: 0.1 10*3/uL (ref 0–0.7)
Eosinophils Relative: 1 %
HCT: 29.9 % — ABNORMAL LOW (ref 40.0–52.0)
HEMOGLOBIN: 10 g/dL — AB (ref 13.0–18.0)
Lymphocytes Relative: 14 %
Lymphs Abs: 1.1 10*3/uL (ref 1.0–3.6)
MCH: 24.5 pg — ABNORMAL LOW (ref 26.0–34.0)
MCHC: 33.3 g/dL (ref 32.0–36.0)
MCV: 73.5 fL — ABNORMAL LOW (ref 80.0–100.0)
Monocytes Absolute: 0.7 10*3/uL (ref 0.2–1.0)
Monocytes Relative: 8 %
NEUTROS PCT: 77 %
Neutro Abs: 6.3 10*3/uL (ref 1.4–6.5)
Platelets: 183 10*3/uL (ref 150–440)
RBC: 4.07 MIL/uL — ABNORMAL LOW (ref 4.40–5.90)
RDW: 20.6 % — AB (ref 11.5–14.5)
WBC: 8.2 10*3/uL (ref 3.8–10.6)

## 2017-07-15 MED ORDER — ONDANSETRON HCL 8 MG PO TABS: 8.0000 mg | ORAL_TABLET | Freq: Two times a day (BID) | ORAL | 1 refills | Status: DC | PRN

## 2017-07-15 MED ORDER — PROCHLORPERAZINE MALEATE 10 MG PO TABS: 10.0000 mg | ORAL_TABLET | Freq: Four times a day (QID) | ORAL | 1 refills | Status: AC | PRN

## 2017-07-15 MED ORDER — LIDOCAINE-PRILOCAINE 2.5-2.5 % EX CREA: TOPICAL_CREAM | CUTANEOUS | 3 refills | Status: AC

## 2017-07-15 MED ORDER — ONDANSETRON HCL 8 MG PO TABS: 8.0000 mg | ORAL_TABLET | Freq: Two times a day (BID) | ORAL | 1 refills | Status: AC | PRN

## 2017-07-15 NOTE — Progress Notes (Signed)
Pt was d/c from hospital and was sent to nursing facility. He still has bilateral feet swelling but it is better since he went to hospital and now in facility. Pt states that he had packet of info brought over here yest when he went to radiation downstairs. I assume all meds are correct because he does not have any papers today. I have made md aware of medications-unable to truly assess

## 2017-07-15 NOTE — Telephone Encounter (Signed)
Unable to leave message

## 2017-07-15 NOTE — Telephone Encounter (Signed)
Pamala Hurry from [facility] called asking for return call for clarification of orders received [phone]

## 2017-07-15 NOTE — Telephone Encounter (Signed)
-----   Message from Alisa Graff, [place] sent at 07/01/2017  9:19 AM EDT ----- Regarding: Please call/ ED visit Was in the ED 5/2

## 2017-07-15 NOTE — Telephone Encounter (Signed)
Spoke with Pamala Hurry and clarified directions of emla cream and zofran.

## 2017-07-15 NOTE — Progress Notes (Addendum)
Hematology/Oncology Follow up note [facility] [phone] [phone]   Patient Care Team: Inc, [place] as PCP - General [name], Andreas Newport, MD as Consulting Physician (General Surgery) Earnestine Leys, MD (Specialist) Telford Nab, RN as Registered Nurse  REFERRING PROVIDER: [place]:  Evaluation of advanced lung squamous carcinoma.   HISTORY OF PRESENTING ILLNESS:  Billy Ortiz is a  77 y.o.  male with PMH listed below who was referred to me for evaluation of lung cancer.  #Follows up with pulmonologist Dr. Raul Del.  He was found to have lung nodules as well as mediastinal lymphadenopathy. Image work up # CT chest 04/25/17; New left retro hilar central lung mass versus adenopathy with resulting mass effect on the left lower lobe bronchus, worrisome for bronchogenic carcinoma. Subcarinal adenopathy has also progressed. Evaluation is complicated by the lack of intravenous contrast and the presence of underlying calcified mediastinal adenopathy consistent with previous granulomatous disease. Management options include bronchoscopy for tissue sampling and PET-CT. 2. No highly suspicious peripheral pulmonary nodules. There is a new perifissural nodule along the superior aspect of the right major fissure, measuring 5 mm. 3. Underlying moderate centrilobular emphysema. 4. Interval median sternotomy. Extensive aortic Atherosclerosis (ICD10-I70.0). 5. These results will be called to the ordering clinician or representative by the Radiologist Assistant, and communication documented in the PACS or zVision Dashboard.  05/10/2017 PET scan  1. Intense FDG uptake associated with the central left perihilar lung mass. Associated subcarinal and right paratracheal hypermetabolic lymph nodes identified. No evidence for distant metastatic disease. Assuming a non-small cell histology  imaging findings on today's study suggest T4N3M0 lesion or stage IIIB. 2.  Aortic Atherosclerosis (ICD10-I70.0). 3. 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 Recommend followup by ultrasound in 3 years. This recommendation follows ACR consensus guidelines: White Paper of the ACR Incidental Findings Committee II on Vascular Findings. J Am Coll Radiol 2013; 10:789-794. Aortic aneurysm NOS (ICD10-I71.9). 4.  Emphysema (ICD10-J43.9).  # 06/03/2017  Status post EBUS bronchoscopy which reviewed lesion endobronchially in the left mainstem, with spread to the left hilar node/mass as well as subcarinal node. Pathology revealed: A. Left main stem bronchus, DIAGNOSIS:  A. LEFT MAINSTEM BRONCHUS; FORCEPS BIOPSY: - SQUAMOUS CELL CARCINOMA.   INTERVAL HISTORY Billy Ortiz is a 77 y.o. male who has above history reviewed by me today presents for follow up visit for management of lung cancer.  During the interval, he was hospitalized twice due to hemoptysis, acute COPD exacerbation, and acute on chronic CKD, lower extremity edema.  He discharged to rehab facility.  He takes lasix 80mg  daily. Reports breathing is better, uses oxygen via nasal cannula.  Patient stopped smoking after recent hospitalization. Reports fatigued.  He reports that he spends more than 50% time during the day in chair or bed.  Shortness of breath is at baseline.  Review of Systems  Constitutional: Positive for malaise/fatigue. Negative for chills, fever and weight loss.  HENT: Negative for congestion, ear discharge, ear pain, nosebleeds, sinus pain and sore throat.   Eyes: Negative for double vision, photophobia, pain, discharge and redness.  Respiratory: Positive for cough, sputum production and shortness of breath. Negative for hemoptysis and wheezing.   Cardiovascular: Negative for chest pain, palpitations, orthopnea, claudication and leg swelling.  Gastrointestinal: Negative for abdominal pain, blood in stool,  constipation, diarrhea, heartburn, melena, nausea and vomiting.  Genitourinary: Negative for dysuria, flank pain, frequency, hematuria and urgency.  Musculoskeletal: Negative for back pain,  myalgias and neck pain.  Skin: Negative for itching and rash.  Neurological: Negative for dizziness, tingling, tremors, sensory change, focal weakness, weakness and headaches.  Endo/Heme/Allergies: Negative for environmental allergies. Does not bruise/bleed easily.  Psychiatric/Behavioral: Negative for depression, hallucinations, substance abuse and suicidal ideas. The patient is not nervous/anxious.     MEDICAL HISTORY:  Past Medical History:  Diagnosis Date  . Cancer [facility])    prostate  . Cataract   . Elevated lipids   . GERD (gastroesophageal reflux disease)   . HOH (hard of hearing)   . Hypertension   . Pneumonia   . Pulmonary nodules/lesions, multiple   . Shortness of breath dyspnea     SURGICAL HISTORY: Past Surgical History:  Procedure Laterality Date  . BACK SURGERY     discectomy ,reports right foot drop since surgery   . CARDIAC CATHETERIZATION     2 stents  . CATARACT EXTRACTION W/PHACO Left 04/22/2015   Procedure: CATARACT EXTRACTION PHACO AND INTRAOCULAR LENS PLACEMENT (IOC);  Surgeon: Birder Robson, MD;  Location: [facility] ORS;  Service: Ophthalmology;  Laterality: Left;  [place]     1:17.8 AP%   25.7 CDE    20.03 fluid pack lot # [id number]  . COLONOSCOPY  2009  . COLONOSCOPY WITH PROPOFOL N/A 03/24/2016   Procedure: COLONOSCOPY WITH PROPOFOL;  Surgeon: Christene Lye, MD;  Location: [facility] ENDOSCOPY;  Service: Endoscopy;  Laterality: N/A;  . CORONARY ARTERY BYPASS GRAFT     single  . ENDOBRONCHIAL ULTRASOUND N/A 06/03/2017   Procedure: ENDOBRONCHIAL ULTRASOUND;  Surgeon: [name], MD;  Location: [facility] ORS;  Service: Pulmonary;  Laterality: N/A;  . ESOPHAGOGASTRODUODENOSCOPY (EGD) WITH PROPOFOL N/A 03/24/2016   Procedure: ESOPHAGOGASTRODUODENOSCOPY (EGD) WITH PROPOFOL;   Surgeon: Christene Lye, MD;  Location: [facility] ENDOSCOPY;  Service: Endoscopy;  Laterality: N/A;  . EYE SURGERY     bilateral  . LEFT HEART CATH AND CORONARY ANGIOGRAPHY Left 05/26/2016   Procedure: Left Heart Cath and Coronary Angiography;  Surgeon: Corey Skains, MD;  Location: [place] CV LAB;  Service: Cardiovascular;  Laterality: Left;  . PORTA CATH INSERTION N/A 06/22/2017   Procedure: PORTA CATH INSERTION;  Surgeon: Algernon Huxley, MD;  Location: [place] CV LAB;  Service: Cardiovascular;  Laterality: N/A;    SOCIAL HISTORY: Social History   Socioeconomic History  . Marital status: Single    Spouse name: Not on file  . Number of children: Not on file  . Years of education: Not on file  . Highest education level: Not on file  Occupational History  . Not on file  Social Needs  . Financial resource strain: Not hard at all  . Food insecurity:    Worry: Never true    Inability: Never true  . Transportation needs:    Medical: No    Non-medical: No  Tobacco Use  . Smoking status: Former Smoker    Packs/day: 1.00    Years: 60.00    Pack years: 60.00    Types: Cigarettes    Last attempt to quit: 07/02/2017    Years since quitting: 0.0  . Smokeless tobacco: Never Used  Substance and Sexual Activity  . Alcohol use: No  . Drug use: No  . Sexual activity: Not on file  Lifestyle  . Physical activity:    Days per week: 0 days    Minutes per session: 0 min  . Stress: Not at all  Relationships  . Social connections:    Talks on phone: Once  a week    Gets together: Once a week    Attends religious service: 1 to 4 times per year    Active member of club or organization: No    Attends meetings of clubs or organizations: Never    Relationship status: Never married  . Intimate partner violence:    Fear of current or ex partner: No    Emotionally abused: No    Physically abused: No    Forced sexual activity: No  Other Topics Concern  . Not on file  Social  History Narrative   Lives with sister    FAMILY HISTORY: Family History  Problem Relation Age of Onset  . Pancreatic cancer Brother   . Pancreatic cancer Paternal Aunt     ALLERGIES:  is allergic to tramadol and lisinopril.  MEDICATIONS:  Current Outpatient Medications  Medication Sig Dispense Refill  . albuterol (PROVENTIL HFA;VENTOLIN HFA) 108 (90 Base) MCG/ACT inhaler Inhale 2 puffs into the lungs every 6 (six) hours as needed for wheezing or shortness of breath. 1 Inhaler 0  . amLODipine (NORVASC) 10 MG tablet Take 1 tablet (10 mg total) by mouth daily. 90 tablet 0  . atorvastatin (LIPITOR) 40 MG tablet Take 40 mg by mouth daily at 6 PM.     . benzonatate (TESSALON) 200 MG capsule Take 1 capsule (200 mg total) by mouth 3 (three) times daily as needed for cough. 60 capsule 0  . docusate sodium (COLACE) 100 MG capsule Take 100 mg by mouth daily.    . fluticasone furoate-vilanterol (BREO ELLIPTA) 100-25 MCG/INH AEPB Inhale 1 puff into the lungs daily.    . folic acid (FOLVITE) 1 MG tablet Take 1 tablet (1 mg total) by mouth daily. 30 tablet 0  . furosemide (LASIX) 80 MG tablet Take 1 tablet (80 mg total) by mouth 2 (two) times daily. 60 tablet 0  . gabapentin (NEURONTIN) 100 MG capsule Take 1 capsule (100 mg total) by mouth 2 (two) times daily. 60 capsule 0  . guaiFENesin (MUCINEX) 600 MG 12 hr tablet Take 1 tablet (600 mg total) by mouth 2 (two) times daily. 30 tablet 0  . hydrALAZINE (APRESOLINE) 50 MG tablet Take 1.5 tablets (75 mg total) by mouth every 8 (eight) hours. 90 tablet 0  . Hydrocortisone (GERHARDT'S BUTT CREAM) CREA Apply 1 application topically 4 (four) times daily. 1 each 0  . isosorbide mononitrate (IMDUR) 60 MG 24 hr tablet Take 60 mg by mouth daily.    . metoprolol succinate (TOPROL-XL) 50 MG 24 hr tablet Take 1 tablet (50 mg total) by mouth daily. Take with or immediately following a meal. 90 tablet 0  . nitroGLYCERIN (NITROSTAT) 0.4 MG SL tablet Place 0.4 mg under  the tongue every 5 (five) minutes as needed for chest pain.    Marland Kitchen omeprazole (PRILOSEC) 20 MG capsule Take 20 mg by mouth 2 (two) times daily before a meal.     . predniSONE (DELTASONE) 20 MG tablet Take 2 tablets (40 mg total) by mouth daily with breakfast. 10 tablet 0  . vitamin B-12 (CYANOCOBALAMIN) 500 MCG tablet Take 1,000 mcg by mouth daily.     Marland Kitchen witch hazel-glycerin (TUCKS) pad Apply topically as needed for itching. 40 each 0   No current facility-administered medications for this visit.    Facility-Administered Medications Ordered in Other Visits  Medication Dose Route Frequency Provider Last Rate Last Dose  . ceFAZolin (ANCEF) IVPB 2g/100 mL premix  2 g Intravenous Once Stegmayer, Janalyn Harder,  PA-C         PHYSICAL EXAMINATION: ECOG PERFORMANCE STATUS: 3 - Symptomatic, >50% confined to bed Vitals:   07/15/17 0944  BP: (!) 145/69  Pulse: (!) 56  Resp: 20  Temp: 97.6 F (36.4 C)  SpO2: 96%   Filed Weights   07/15/17 0944  Weight: 183 lb 12.8 oz (83.4 kg)    Physical Exam  Constitutional: He is oriented to person, place, and time. He appears well-developed and well-nourished. No distress.  HENT:  Head: Normocephalic and atraumatic.  Right Ear: External ear normal.  Left Ear: External ear normal.  Mouth/Throat: Oropharynx is clear and moist.  Eyes: Pupils are equal, round, and reactive to light. Conjunctivae and EOM are normal. No scleral icterus.  Neck: Normal range of motion. Neck supple.  Cardiovascular: Normal rate, regular rhythm and normal heart sounds.  No murmur heard. Pulmonary/Chest: Effort normal and breath sounds normal. No respiratory distress. He has no wheezes. He has no rales. He exhibits no tenderness.  Decreased breathsound bilaterally.   Abdominal: Soft. Bowel sounds are normal. He exhibits no distension and no mass. There is no tenderness.  Musculoskeletal: Normal range of motion. He exhibits edema. He exhibits no deformity.  Bilateral +3 pitting  edema  Lymphadenopathy:    He has no cervical adenopathy.  Neurological: He is alert and oriented to person, place, and time. No cranial nerve deficit. Coordination normal.  Skin: Skin is warm and dry. No rash noted. No erythema.  Psychiatric: He has a normal mood and affect. His behavior is normal. Thought content normal.     LABORATORY DATA:  I have reviewed the data as listed Lab Results  Component Value Date   WBC 8.2 07/15/2017   HGB 10.0 (L) 07/15/2017   HCT 29.9 (L) 07/15/2017   MCV 73.5 (L) 07/15/2017   PLT 183 07/15/2017   Recent Labs    06/10/17 1111  06/21/17 0054  06/26/17 0421  06/30/17 1831  07/09/17 1050 07/11/17 1044 07/15/17 0920  NA  --    < > 134*   < > 137   < >  --    < > 136 137 138  K  --    < > 3.4*   < > 3.8   < >  --    < > 3.2* 3.3* 3.9  CL  --    < > 104   < > 102   < >  --    < > 94* 98* 101  CO2  --    < > 24   < > 30   < >  --    < > 33* 29 26  GLUCOSE  --    < > 113*   < > 122*   < >  --    < > 165* 122* 124*  BUN  --    < > 26*   < > 66*   < >  --    < > 59* 68* 77*  CREATININE  --    < > 2.91*   < > 2.89*   < >  --    < > 2.57* 2.59* 2.79*  CALCIUM  --    < > 8.0*   < > 8.0*   < >  --    < > 9.3 9.5 10.5*  GFRNONAA  --    < > 20*   < > 20*   < >  --    < >  23* 22* 21*  GFRAA  --    < > 23*   < > 23*   < >  --    < > 26* 26* 24*  PROT 6.8  --  6.7  6.9  --   --   --  6.2*  --   --   --  7.0  ALBUMIN 3.3*  --  2.4*  2.6*  --  2.2*  --  2.7*  --   --   --  4.2  AST 13*  --  15  15  --   --   --  28  --   --   --  19  ALT 9*  --  8*  9*  --   --   --  38  --   --   --  60  ALKPHOS 89  --  97  94  --   --   --  69  --   --   --  62  BILITOT 1.1  --  0.9  0.9  --   --   --  0.6  --   --   --  0.9  BILIDIR 0.2  --  0.2  --   --   --  <0.1*  --   --   --   --   IBILI 0.9  --  0.7  --   --   --  NOT CALCULATED  --   --   --   --    < > = values in this interval not displayed.    MRI brain Jul 02, 2017 showed 1. No evidence for metastatic  disease to the brain.  Small lesions may not be visualized without contrast however patient cannot receive contrast due to severe renal insufficiency.    ASSESSMENT & PLAN:  77yo Male present for evaluation for stage IIIC lung squamous carcinoma. 1. Encounter for antineoplastic chemotherapy   2. Squamous cell lung cancer, left (HCC)   3. Stage 4 chronic kidney disease ([place])   4. Lower extremity edema   5. Chronic respiratory failure with hypoxia, on home O2 therapy (HCC)    #Image was independently reviewed by me and discussed with patient.  Locally advanced HCC lung squamous carcinoma,  original plan was concurrent chemoradiation, goal of care is curative. However due to patient's poor performance status, recent hospitalization, I will hold chemotherapy today.  continue follow-up with radiation oncologist.  He has started on radiation.  I plan to see patient in 1 week, repeat labs And have a reassessment to see if he is strong enough to be started on concurrent chemo therapy. Today's ECOG is 3.   # Microcytic anemia: iron panel reviewed with patient.consistent with anemia of chronic disease.  Microcytosis is chronic, possible underlying thalassemia trait.   # Respiratory failure/COPD/emphysema: continue breathing treatment. Smoke cessation discussed.  # CKD/LE edema: follows up with nephrology. Continue lasix.  # Hypercalcemia: likely due to malignancy and CKD. Plan Xgeva for treatment of hypercalcemia,.  # Patient's son called and I discussed with him about plan.   All questions were answered. The patient knows to call the clinic with any problems questions or concerns.  Return of visit: 1 week.     Earlie Server, MD, PhD Hematology Oncology [facility] at [facility] Pager- [phone] 07/15/2017

## 2017-07-15 NOTE — Progress Notes (Signed)
START ON PATHWAY REGIMEN - Non-Small Cell Lung     Administer weekly:     Paclitaxel      Carboplatin   **Always confirm dose/schedule in your pharmacy ordering system**    Patient Characteristics: Stage III - Unresectable, PS = 0, 1 AJCC T Category: T4 Current Disease Status: No Distant Mets or Local Recurrence AJCC N Category: N2 AJCC M Category: M0 AJCC 8 Stage Grouping: IIIB Performance Status: PS = 0, 1 Intent of Therapy: Curative Intent, Discussed with Patient

## 2017-07-18 ENCOUNTER — Ambulatory Visit
Admission: RE | Admit: 2017-07-18 | Discharge: 2017-07-18 | Disposition: A | Discharge status: Home / Self Care | Payer: Medicare Other | Source: Ambulatory Visit | Attending: Radiation Oncology | Admitting: Radiation Oncology

## 2017-07-18 ENCOUNTER — Ambulatory Visit: Payer: Medicare Other

## 2017-07-18 ENCOUNTER — Inpatient Hospital Stay: Payer: Medicare Other

## 2017-07-18 DIAGNOSIS — Z510 Encounter for antineoplastic radiation therapy: Secondary | ICD-10-CM | POA: Diagnosis not present

## 2017-07-18 LAB — ACID FAST CULTURE WITH REFLEXED SENSITIVITIES (MYCOBACTERIA): Acid Fast Culture: NEGATIVE

## 2017-07-18 NOTE — Progress Notes (Signed)
  Oncology Nurse Navigator Documentation  Navigator Location: [facility]Med Onc (07/15/17 1130)   )Navigator Encounter Type: Treatment (07/15/17 1130)                   Treatment Initiated Date: 07/14/17 (07/15/17 1130) Patient Visit Type: MedOnc (07/15/17 1130) Treatment Phase: Treatment (07/15/17 1130) Barriers/Navigation Needs: No barriers at this time (07/15/17 1130)   Interventions: None required (07/15/17 1130)             met with patient in lobby after seeing Dr. Tasia Catchings for follow up. All questions answered during visit. Pt did not voice any needs or concerns at this time. States that chemo will be rescheduled to next week since he was recently discharged from hospital. Nothing further needed at this time. Informed pt to call if has any further questions or needs.          Time Spent with Patient: 30 (07/15/17 1130)

## 2017-07-19 ENCOUNTER — Ambulatory Visit
Admission: RE | Admit: 2017-07-19 | Discharge: 2017-07-19 | Disposition: A | Discharge status: Home / Self Care | Payer: Medicare Other | Source: Ambulatory Visit | Attending: Radiation Oncology | Admitting: Radiation Oncology

## 2017-07-19 ENCOUNTER — Inpatient Hospital Stay: Payer: Medicare Other

## 2017-07-19 ENCOUNTER — Other Ambulatory Visit: Payer: Self-pay | Admitting: *Deleted

## 2017-07-19 ENCOUNTER — Ambulatory Visit: Payer: Medicare Other

## 2017-07-19 DIAGNOSIS — Z510 Encounter for antineoplastic radiation therapy: Secondary | ICD-10-CM | POA: Diagnosis not present

## 2017-07-19 NOTE — Telephone Encounter (Signed)
Unable to leave message mailbox full.

## 2017-07-20 ENCOUNTER — Ambulatory Visit
Admission: RE | Admit: 2017-07-20 | Discharge: 2017-07-20 | Disposition: A | Discharge status: Home / Self Care | Payer: Medicare Other | Source: Ambulatory Visit | Attending: Radiation Oncology | Admitting: Radiation Oncology

## 2017-07-20 ENCOUNTER — Inpatient Hospital Stay: Payer: Medicare Other

## 2017-07-20 ENCOUNTER — Ambulatory Visit: Payer: Medicare Other

## 2017-07-20 DIAGNOSIS — Z510 Encounter for antineoplastic radiation therapy: Secondary | ICD-10-CM | POA: Diagnosis not present

## 2017-07-21 ENCOUNTER — Inpatient Hospital Stay: Payer: Medicare Other

## 2017-07-21 ENCOUNTER — Ambulatory Visit
Admission: RE | Admit: 2017-07-21 | Discharge: 2017-07-21 | Disposition: A | Discharge status: Home / Self Care | Payer: Medicare Other | Source: Ambulatory Visit | Attending: Radiation Oncology | Admitting: Radiation Oncology

## 2017-07-21 ENCOUNTER — Encounter: Payer: Self-pay | Admitting: Oncology

## 2017-07-21 ENCOUNTER — Ambulatory Visit: Payer: Medicare Other

## 2017-07-21 DIAGNOSIS — Z510 Encounter for antineoplastic radiation therapy: Secondary | ICD-10-CM | POA: Diagnosis not present

## 2017-07-21 HISTORY — DX: Hypercalcemia: E83.52

## 2017-07-21 NOTE — Addendum Note (Signed)
Addended by: Earlie Server on: 07/21/2017 03:56 PM   Modules accepted: Orders

## 2017-07-22 ENCOUNTER — Other Ambulatory Visit: Payer: Self-pay | Admitting: Internal Medicine

## 2017-07-22 ENCOUNTER — Inpatient Hospital Stay: Payer: Medicare Other

## 2017-07-22 ENCOUNTER — Ambulatory Visit: Payer: Medicare Other

## 2017-07-22 ENCOUNTER — Inpatient Hospital Stay (HOSPITAL_BASED_OUTPATIENT_CLINIC_OR_DEPARTMENT_OTHER): Payer: Medicare Other | Admitting: Oncology

## 2017-07-22 ENCOUNTER — Ambulatory Visit
Admission: RE | Admit: 2017-07-22 | Discharge: 2017-07-22 | Disposition: A | Discharge status: Home / Self Care | Payer: Medicare Other | Source: Ambulatory Visit | Attending: Radiation Oncology | Admitting: Radiation Oncology

## 2017-07-22 VITALS — BP 160/73 | HR 51 | Temp 97.4°F | Resp 20 | Wt 186.0 lb

## 2017-07-22 VITALS — BP 158/72 | HR 52 | Resp 18

## 2017-07-22 DIAGNOSIS — J9611 Chronic respiratory failure with hypoxia: Secondary | ICD-10-CM

## 2017-07-22 DIAGNOSIS — Z8546 Personal history of malignant neoplasm of prostate: Secondary | ICD-10-CM

## 2017-07-22 DIAGNOSIS — N184 Chronic kidney disease, stage 4 (severe): Secondary | ICD-10-CM

## 2017-07-22 DIAGNOSIS — Z5111 Encounter for antineoplastic chemotherapy: Secondary | ICD-10-CM

## 2017-07-22 DIAGNOSIS — K219 Gastro-esophageal reflux disease without esophagitis: Secondary | ICD-10-CM

## 2017-07-22 DIAGNOSIS — R5383 Other fatigue: Secondary | ICD-10-CM

## 2017-07-22 DIAGNOSIS — C3492 Malignant neoplasm of unspecified part of left bronchus or lung: Secondary | ICD-10-CM

## 2017-07-22 DIAGNOSIS — Z8701 Personal history of pneumonia (recurrent): Secondary | ICD-10-CM

## 2017-07-22 DIAGNOSIS — R600 Localized edema: Secondary | ICD-10-CM

## 2017-07-22 DIAGNOSIS — E785 Hyperlipidemia, unspecified: Secondary | ICD-10-CM

## 2017-07-22 DIAGNOSIS — I129 Hypertensive chronic kidney disease with stage 1 through stage 4 chronic kidney disease, or unspecified chronic kidney disease: Secondary | ICD-10-CM | POA: Diagnosis not present

## 2017-07-22 DIAGNOSIS — Z79899 Other long term (current) drug therapy: Secondary | ICD-10-CM

## 2017-07-22 DIAGNOSIS — J441 Chronic obstructive pulmonary disease with (acute) exacerbation: Secondary | ICD-10-CM

## 2017-07-22 DIAGNOSIS — R531 Weakness: Secondary | ICD-10-CM | POA: Diagnosis not present

## 2017-07-22 DIAGNOSIS — F17210 Nicotine dependence, cigarettes, uncomplicated: Secondary | ICD-10-CM

## 2017-07-22 DIAGNOSIS — C3490 Malignant neoplasm of unspecified part of unspecified bronchus or lung: Secondary | ICD-10-CM

## 2017-07-22 DIAGNOSIS — Z9981 Dependence on supplemental oxygen: Secondary | ICD-10-CM

## 2017-07-22 DIAGNOSIS — Z800 Family history of malignant neoplasm of digestive organs: Secondary | ICD-10-CM

## 2017-07-22 DIAGNOSIS — Z87891 Personal history of nicotine dependence: Secondary | ICD-10-CM

## 2017-07-22 DIAGNOSIS — Z510 Encounter for antineoplastic radiation therapy: Secondary | ICD-10-CM | POA: Diagnosis not present

## 2017-07-22 LAB — CBC WITH DIFFERENTIAL/PLATELET
BASOS PCT: 0 %
Basophils Absolute: 0 10*3/uL (ref 0–0.1)
EOS ABS: 0.1 10*3/uL (ref 0–0.7)
EOS PCT: 1 %
HCT: 30.3 % — ABNORMAL LOW (ref 40.0–52.0)
HEMOGLOBIN: 10.1 g/dL — AB (ref 13.0–18.0)
LYMPHS PCT: 18 %
Lymphs Abs: 1.5 10*3/uL (ref 1.0–3.6)
MCH: 24.5 pg — ABNORMAL LOW (ref 26.0–34.0)
MCHC: 33.5 g/dL (ref 32.0–36.0)
MCV: 73.4 fL — ABNORMAL LOW (ref 80.0–100.0)
Monocytes Absolute: 0.6 10*3/uL (ref 0.2–1.0)
Monocytes Relative: 7 %
NEUTROS PCT: 74 %
Neutro Abs: 6.1 10*3/uL (ref 1.4–6.5)
Platelets: 193 10*3/uL (ref 150–440)
RBC: 4.12 MIL/uL — AB (ref 4.40–5.90)
RDW: 21 % — AB (ref 11.5–14.5)
Smear Review: ADEQUATE
WBC: 8.3 10*3/uL (ref 3.8–10.6)

## 2017-07-22 LAB — COMPREHENSIVE METABOLIC PANEL
ALBUMIN: 3.8 g/dL (ref 3.5–5.0)
ALT: 28 U/L (ref 17–63)
ANION GAP: 10 (ref 5–15)
AST: 15 U/L (ref 15–41)
Alkaline Phosphatase: 52 U/L (ref 38–126)
BUN: 68 mg/dL — ABNORMAL HIGH (ref 6–20)
CO2: 27 mmol/L (ref 22–32)
Calcium: 10 mg/dL (ref 8.9–10.3)
Chloride: 99 mmol/L — ABNORMAL LOW (ref 101–111)
Creatinine, Ser: 2.52 mg/dL — ABNORMAL HIGH (ref 0.61–1.24)
GFR calc Af Amer: 27 mL/min — ABNORMAL LOW (ref >60)
GFR calc non Af Amer: 23 mL/min — ABNORMAL LOW (ref >60)
GLUCOSE: 84 mg/dL (ref 65–99)
POTASSIUM: 3.6 mmol/L (ref 3.5–5.1)
SODIUM: 136 mmol/L (ref 135–145)
Total Bilirubin: 1 mg/dL (ref 0.3–1.2)
Total Protein: 6.8 g/dL (ref 6.5–8.1)

## 2017-07-22 MED ORDER — HEPARIN SOD (PORK) LOCK FLUSH 100 UNIT/ML IV SOLN
500.0000 [IU] | Freq: Once | INTRAVENOUS | Status: AC
  Administered 2017-07-22: 500 [IU] via INTRAVENOUS
  Filled 2017-07-22: qty 5

## 2017-07-22 MED ORDER — SODIUM CHLORIDE 0.9 % IV SOLN
Freq: Once | INTRAVENOUS | Status: AC
  Administered 2017-07-22: 10:00:00 via INTRAVENOUS
  Filled 2017-07-22: qty 1000

## 2017-07-22 MED ORDER — SODIUM CHLORIDE 0.9% FLUSH
10.0000 mL | INTRAVENOUS | Status: DC | PRN
  Administered 2017-07-22: 10 mL via INTRAVENOUS
  Filled 2017-07-22: qty 10

## 2017-07-22 MED ORDER — DENOSUMAB 120 MG/1.7ML ~~LOC~~ SOLN
120.0000 mg | Freq: Once | SUBCUTANEOUS | Status: AC
  Administered 2017-07-22: 120 mg via SUBCUTANEOUS
  Filled 2017-07-22: qty 1.7

## 2017-07-22 MED ORDER — DIPHENHYDRAMINE HCL 50 MG/ML IJ SOLN
50.0000 mg | Freq: Once | INTRAMUSCULAR | Status: AC
  Administered 2017-07-22: 50 mg via INTRAVENOUS
  Filled 2017-07-22: qty 1

## 2017-07-22 MED ORDER — FAMOTIDINE IN NACL 20-0.9 MG/50ML-% IV SOLN
20.0000 mg | Freq: Once | INTRAVENOUS | Status: AC
  Administered 2017-07-22: 20 mg via INTRAVENOUS
  Filled 2017-07-22: qty 50

## 2017-07-22 MED ORDER — DEXAMETHASONE SODIUM PHOSPHATE 100 MG/10ML IJ SOLN
20.0000 mg | Freq: Once | INTRAMUSCULAR | Status: AC
  Administered 2017-07-22: 20 mg via INTRAVENOUS
  Filled 2017-07-22: qty 2

## 2017-07-22 MED ORDER — SODIUM CHLORIDE 0.9 % IV SOLN
45.0000 mg/m2 | Freq: Once | INTRAVENOUS | Status: AC
  Administered 2017-07-22: 96 mg via INTRAVENOUS
  Filled 2017-07-22: qty 16

## 2017-07-22 MED ORDER — SODIUM CHLORIDE 0.9 % IV SOLN
108.8000 mg | Freq: Once | INTRAVENOUS | Status: AC
  Administered 2017-07-22: 110 mg via INTRAVENOUS
  Filled 2017-07-22: qty 11

## 2017-07-22 MED ORDER — PALONOSETRON HCL INJECTION 0.25 MG/5ML
0.2500 mg | Freq: Once | INTRAVENOUS | Status: AC
  Administered 2017-07-22: 0.25 mg via INTRAVENOUS
  Filled 2017-07-22: qty 5

## 2017-07-22 NOTE — Progress Notes (Signed)
Creatinine: 2.52. NP, Rulon Abide, notified via telephone and already aware. Per NP order: proceed with scheduled treatment today.

## 2017-07-22 NOTE — Progress Notes (Signed)
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   HISTORY OF PRESENTING ILLNESS:  Patient last seen by primary medical oncologist Dr. Tasia Catchings on 07/15/2017.  He had recently been hospitalized twice due to hemoptysis, acute COPD exacerbation and acute on chronic CKD with lower extremity edema.  He was discharged to a rehab facility.  Currently taking 80 mg of Lasix daily.  Reports breathing is much better with the use of supplemental oxygen.  Recently quit smoking.  Reported fatigue.  PMH listed below: Followed by Dr. Raul Del and pulmonology.  Found to have lung nodules as well as mediastinal lymphadenopathy.  CT chest from 04/25/2017 revealed new left hilar central lung mass versus adenopathy.  Evaluation was complicated due to lack of intravenous contrast.  PET scan from 05/10/2017 revealed intense uptake with the central left perihilar lung mass, associated sub-carinal and right paratracheal hypermetabolic lymph nodes identified.  No evidence of distant metastasis.  Had bronchoscopy performed on 06/03/2017 revealing left mainstem bronchus squamous cell carcinoma.  INTERVAL HISTORY Billy Ortiz is a 77 y.o. male who presents for reconsideration of cycle 1 of Carbo/Taxol with concurrent radiation.  Patient presented last week for consideration of cycle 1 which was held due to declining performance status.  Today patient appears to be doing much better.  Only complaint is continued bilateral leg swelling and intermittent shortness of breath.  Wears oxygen as needed. States his legs are  significantly less swollen than last week.  He continues to wear his TED hose and keep his legs elevated as much as possible.  Review of Systems  Constitutional: Negative.  Negative for chills, fever, malaise/fatigue and weight loss.  HENT: Negative for congestion and ear pain.   Eyes: Negative.  Negative for blurred vision and double vision.  Respiratory: Negative.  Negative for cough, sputum production and shortness of breath.   Cardiovascular: Positive for leg swelling (+ 2 pitting edema). Negative for chest pain and palpitations.  Gastrointestinal: Negative.  Negative for abdominal pain, constipation, diarrhea, nausea and vomiting.  Genitourinary: Negative for dysuria, frequency and urgency.  Musculoskeletal: Negative for back pain and falls.  Skin: Negative.  Negative for rash.  Neurological: Positive for weakness. Negative for headaches.  Endo/Heme/Allergies: Negative.  Does not bruise/bleed easily.  Psychiatric/Behavioral: Negative.  Negative for depression. The patient is not nervous/anxious and does not have insomnia.     MEDICAL HISTORY:  Past Medical History:  Diagnosis Date  . Cancer [facility])    prostate  . Cataract   . Elevated lipids   . GERD (gastroesophageal reflux disease)   . HOH (hard of hearing)   . Hypercalcemia 07/21/2017  . Hypertension   . Pneumonia   . Pulmonary nodules/lesions, multiple   . Shortness of breath dyspnea     SURGICAL HISTORY: Past Surgical History:  Procedure Laterality Date  . BACK SURGERY     discectomy ,reports right foot drop since surgery   . CARDIAC CATHETERIZATION     2 stents  . CATARACT EXTRACTION W/PHACO Left 04/22/2015   Procedure: CATARACT EXTRACTION PHACO AND INTRAOCULAR LENS PLACEMENT (IOC);  Surgeon: Birder Robson, MD;  Location: [facility] ORS;  Service: Ophthalmology;  Laterality: Left;  [place]  1:17.8 AP%   25.7 CDE    20.03 fluid pack lot # [id number]  . COLONOSCOPY  2009  . COLONOSCOPY WITH PROPOFOL N/A 03/24/2016    Procedure: COLONOSCOPY WITH PROPOFOL;  Surgeon: Christene Lye, MD;  Location: [facility] ENDOSCOPY;  Service: Endoscopy;  Laterality: N/A;  . CORONARY ARTERY BYPASS GRAFT     single  . ENDOBRONCHIAL ULTRASOUND N/A 06/03/2017   Procedure: ENDOBRONCHIAL ULTRASOUND;  Surgeon: [name], MD;  Location: [facility] ORS;  Service: Pulmonary;  Laterality: N/A;  . ESOPHAGOGASTRODUODENOSCOPY (EGD) WITH PROPOFOL N/A 03/24/2016   Procedure: ESOPHAGOGASTRODUODENOSCOPY (EGD) WITH PROPOFOL;  Surgeon: Christene Lye, MD;  Location: [facility] ENDOSCOPY;  Service: Endoscopy;  Laterality: N/A;  . EYE SURGERY     bilateral  . LEFT HEART CATH AND CORONARY ANGIOGRAPHY Left 05/26/2016   Procedure: Left Heart Cath and Coronary Angiography;  Surgeon: Corey Skains, MD;  Location: [place] [facility];  Service: Cardiovascular;  Laterality: Left;  . PORTA CATH INSERTION N/A 06/22/2017   Procedure: PORTA CATH INSERTION;  Surgeon: Algernon Huxley, MD;  Location: [place] [facility];  Service: Cardiovascular;  Laterality: N/A;    SOCIAL HISTORY: Social History   Socioeconomic History  . Marital status: Single    Spouse name: Not on file  . Number of children: Not on file  . Years of education: Not on file  . Highest education level: Not on file  Occupational History  . Not on file  Social Needs  . Financial resource strain: Not hard at all  . Food insecurity:    Worry: Never true    Inability: Never true  . Transportation needs:    Medical: No    Non-medical: No  Tobacco Use  . Smoking status: Former Smoker    Packs/day: 1.00    Years: 60.00    Pack years: 60.00    Types: Cigarettes    Last attempt to quit: 07/02/2017    Years since quitting: 0.0  . Smokeless tobacco: Never Used  Substance and Sexual Activity  . Alcohol use: No  . Drug use: No  . Sexual activity: Not on file  Lifestyle  . Physical activity:    Days per week: 0 days    Minutes per session: 0 min  . Stress: Not at all    Relationships  . Social connections:    Talks on phone: Once a week    Gets together: Once a week    Attends religious service: 1 to 4 times per year    Active member of club or organization: No    Attends meetings of clubs or organizations: Never    Relationship status: Never married  . Intimate partner violence:    Fear of current or ex partner: No    Emotionally abused: No    Physically abused: No    Forced sexual activity: No  Other Topics Concern  . Not on file  Social History Narrative   Lives with sister    FAMILY HISTORY: Family History  Problem Relation Age of Onset  . Pancreatic cancer Brother   . Pancreatic cancer Paternal Aunt     ALLERGIES:  is allergic to tramadol and lisinopril.  MEDICATIONS:  Current Outpatient Medications  Medication Sig Dispense Refill  . albuterol (PROVENTIL HFA;VENTOLIN HFA) 108 (90 Base) MCG/ACT inhaler Inhale 2 puffs into the lungs every 6 (six) hours as needed for wheezing or shortness of breath. 1 Inhaler 0  . amLODipine (NORVASC) 10 MG tablet Take 1 tablet (  10 mg total) by mouth daily. 90 tablet 0  . atorvastatin (LIPITOR) 40 MG tablet Take 40 mg by mouth daily at 6 PM.     . benzonatate (TESSALON) 200 MG capsule Take 1 capsule (200 mg total) by mouth 3 (three) times daily as needed for cough. 60 capsule 0  . docusate sodium (COLACE) 100 MG capsule Take 100 mg by mouth daily.    . fluticasone furoate-vilanterol (BREO ELLIPTA) 100-25 MCG/INH AEPB Inhale 1 puff into the lungs daily.    . folic acid (FOLVITE) 1 MG tablet Take 1 tablet (1 mg total) by mouth daily. 30 tablet 0  . furosemide (LASIX) 80 MG tablet Take 1 tablet (80 mg total) by mouth 2 (two) times daily. 60 tablet 0  . gabapentin (NEURONTIN) 100 MG capsule Take 1 capsule (100 mg total) by mouth 2 (two) times daily. 60 capsule 0  . guaiFENesin (MUCINEX) 600 MG 12 hr tablet Take 1 tablet (600 mg total) by mouth 2 (two) times daily. 30 tablet 0  . hydrALAZINE (APRESOLINE) 50  MG tablet Take 1.5 tablets (75 mg total) by mouth every 8 (eight) hours. 90 tablet 0  . Hydrocortisone (GERHARDT'S BUTT CREAM) CREA Apply 1 application topically 4 (four) times daily. 1 each 0  . isosorbide mononitrate (IMDUR) 60 MG 24 hr tablet Take 60 mg by mouth daily.    Marland Kitchen lidocaine-prilocaine (EMLA) cream Apply to affected area once 30 g 3  . metoprolol succinate (TOPROL-XL) 50 MG 24 hr tablet Take 1 tablet (50 mg total) by mouth daily. Take with or immediately following a meal. 90 tablet 0  . nitroGLYCERIN (NITROSTAT) 0.4 MG SL tablet Place 0.4 mg under the tongue every 5 (five) minutes as needed for chest pain.    Marland Kitchen omeprazole (PRILOSEC) 20 MG capsule Take 20 mg by mouth 2 (two) times daily before a meal.     . ondansetron (ZOFRAN) 8 MG tablet Take 1 tablet (8 mg total) by mouth 2 (two) times daily as needed for refractory nausea / vomiting. Start on day 3 after chemo. 30 tablet 1  . predniSONE (DELTASONE) 20 MG tablet Take 2 tablets (40 mg total) by mouth daily with breakfast. 10 tablet 0  . prochlorperazine (COMPAZINE) 10 MG tablet Take 1 tablet (10 mg total) by mouth every 6 (six) hours as needed (Nausea or vomiting). 30 tablet 1  . vitamin B-12 (CYANOCOBALAMIN) 500 MCG tablet Take 1,000 mcg by mouth daily.     Marland Kitchen witch hazel-glycerin (TUCKS) pad Apply topically as needed for itching. 40 each 0   No current facility-administered medications for this visit.    Facility-Administered Medications Ordered in Other Visits  Medication Dose Route Frequency Provider Last Rate Last Dose  . CARBOplatin (PARAPLATIN) 110 mg in sodium chloride 0.9 % 250 mL chemo infusion  110 mg Intravenous Once Faythe Casa E, NP      . ceFAZolin (ANCEF) IVPB 2g/100 mL premix  2 g Intravenous Once Stegmayer, Kimberly A, PA-C      . heparin lock flush 100 unit/mL  500 Units Intravenous Once Earlie Server, MD      . PACLitaxel (TAXOL) 96 mg in sodium chloride 0.9 % 250 mL chemo infusion (</= 80mg /m2)  45 mg/m2 (Treatment  Plan Recorded) Intravenous Once Jacquelin Hawking, NP 266 mL/hr at 07/22/17 1126 96 mg at 07/22/17 1126  . sodium chloride flush (NS) 0.9 % injection 10 mL  10 mL Intravenous PRN Earlie Server, MD   10 mL at 07/22/17  0900     PHYSICAL EXAMINATION: ECOG PERFORMANCE STATUS: 3 - Symptomatic, >50% confined to bed Vitals:   07/22/17 0908  BP: (!) 160/73  Pulse: (!) 51  Resp: 20  Temp: (!) 97.4 F (36.3 C)  SpO2: 96%   Filed Weights   07/22/17 0908  Weight: 186 lb (84.4 kg)    Physical Exam  Constitutional: He is oriented to person, place, and time. Vital signs are normal.  HENT:  Head: Normocephalic and atraumatic.  Eyes: Pupils are equal, round, and reactive to light.  Neck: Normal range of motion.  Cardiovascular: Normal rate, regular rhythm and normal heart sounds.  No murmur heard. Pulmonary/Chest: Effort normal. He has wheezes in the left upper field, the left middle field and the left lower field.  Abdominal: Soft. Normal appearance and bowel sounds are normal. He exhibits no distension. There is no tenderness.  Musculoskeletal: Normal range of motion. He exhibits edema.  + 2 pitting  Neurological: He is alert and oriented to person, place, and time.  Skin: Skin is warm and dry. No rash noted.  Psychiatric: Judgment normal.    LABORATORY DATA:  I have reviewed the data as listed Lab Results  Component Value Date   WBC 8.3 07/22/2017   HGB 10.1 (L) 07/22/2017   HCT 30.3 (L) 07/22/2017   MCV 73.4 (L) 07/22/2017   PLT 193 07/22/2017   Recent Labs    06/10/17 1111  06/21/17 0054  06/30/17 1831  07/11/17 1044 07/15/17 0920 07/22/17 0903  NA  --    < > 134*   < >  --    < > 137 138 136  K  --    < > 3.4*   < >  --    < > 3.3* 3.9 3.6  CL  --    < > 104   < >  --    < > 98* 101 99*  CO2  --    < > 24   < >  --    < > 29 26 27   GLUCOSE  --    < > 113*   < >  --    < > 122* 124* 84  BUN  --    < > 26*   < >  --    < > 68* 77* 68*  CREATININE  --    < > 2.91*   < >  --     < > 2.59* 2.79* 2.52*  CALCIUM  --    < > 8.0*   < >  --    < > 9.5 10.5* 10.0  GFRNONAA  --    < > 20*   < >  --    < > 22* 21* 23*  GFRAA  --    < > 23*   < >  --    < > 26* 24* 27*  PROT 6.8  --  6.7  6.9  --  6.2*  --   --  7.0 6.8  ALBUMIN 3.3*  --  2.4*  2.6*   < > 2.7*  --   --  4.2 3.8  AST 13*  --  15  15  --  28  --   --  19 15  ALT 9*  --  8*  9*  --  38  --   --  60 28  ALKPHOS 89  --  97  94  --  69  --   --  62 52  BILITOT 1.1  --  0.9  0.9  --  0.6  --   --  0.9 1.0  BILIDIR 0.2  --  0.2  --  <0.1*  --   --   --   --   IBILI 0.9  --  0.7  --  NOT CALCULATED  --   --   --   --    < > = values in this interval not displayed.    MRI brain Jul 02, 2017 showed 1. No evidence for metastatic disease to the brain.  Small lesions may not be visualized without contrast however patient cannot receive contrast due to severe renal insufficiency.    ASSESSMENT & PLAN:   Chemo held last week d/t recent hospitalization and decreasing performance status. Performance status greatly improved from last week.  He has started radiation and is tolerating well.   Today's EGOG is 1.  [place] for treatment today.   Microcytic anemia: Stable.  Hemoglobin 10.1 today.  Microcytosis is chronic.  Dr. Tasia Catchings is thinking underlying thalassemia trait.  CKD/lower extremity edema: Improving: Continue wearing TED hose and elevating.  Continue Lasix.  Follow-up with nephrology as scheduled.  Shortness of breath: Has supplemental oxygen at home.  Continue nebulizers as prescribed.  Oxygen saturations 96% today on room air.  Hypercalcemia: Calcium is 10 today.  Will receive Xgeva today for hypercalcemia.  All questions were answered. The patient knows to call the clinic with any problems questions or concerns.  Return of visit: 1 week.  Greater than 50% was spent in counseling and coordination of care with this patient including but not limited to discussion of the relevant topics above (See A&P)  including, but not limited to diagnosis and management of acute and chronic medical conditions.   Faythe Casa, NP 07/22/2017 1:25 PM

## 2017-07-22 NOTE — Progress Notes (Signed)
Ok to treat with lab values per MD.

## 2017-07-24 ENCOUNTER — Ambulatory Visit: Payer: Medicare Other

## 2017-07-26 ENCOUNTER — Ambulatory Visit
Admission: RE | Admit: 2017-07-26 | Discharge: 2017-07-26 | Disposition: A | Discharge status: Home / Self Care | Payer: Medicare Other | Source: Ambulatory Visit | Attending: Radiation Oncology | Admitting: Radiation Oncology

## 2017-07-26 ENCOUNTER — Inpatient Hospital Stay: Payer: Medicare Other

## 2017-07-26 ENCOUNTER — Ambulatory Visit: Payer: Medicare Other

## 2017-07-26 DIAGNOSIS — Z510 Encounter for antineoplastic radiation therapy: Secondary | ICD-10-CM | POA: Diagnosis not present

## 2017-07-27 ENCOUNTER — Ambulatory Visit
Admission: RE | Admit: 2017-07-27 | Discharge: 2017-07-27 | Disposition: A | Discharge status: Home / Self Care | Payer: Medicare Other | Source: Ambulatory Visit | Attending: Radiation Oncology | Admitting: Radiation Oncology

## 2017-07-27 ENCOUNTER — Telehealth: Payer: Self-pay | Admitting: *Deleted

## 2017-07-27 ENCOUNTER — Ambulatory Visit: Payer: Medicare Other

## 2017-07-27 ENCOUNTER — Inpatient Hospital Stay: Payer: Medicare Other

## 2017-07-27 DIAGNOSIS — Z510 Encounter for antineoplastic radiation therapy: Secondary | ICD-10-CM | POA: Diagnosis not present

## 2017-07-27 NOTE — Telephone Encounter (Signed)
Per son, patient told him there is a commode seat he is to use and he wants to know who purchases it and where to get it.  I deferred him to the nursing facility where he is being discharged from. I had to leave a voice mail on his phone regarding this matter

## 2017-07-28 ENCOUNTER — Ambulatory Visit: Payer: Medicare Other

## 2017-07-28 ENCOUNTER — Ambulatory Visit
Admission: RE | Admit: 2017-07-28 | Discharge: 2017-07-28 | Disposition: A | Discharge status: Home / Self Care | Payer: Medicare Other | Source: Ambulatory Visit | Attending: Radiation Oncology | Admitting: Radiation Oncology

## 2017-07-28 ENCOUNTER — Inpatient Hospital Stay: Payer: Medicare Other

## 2017-07-28 ENCOUNTER — Encounter: Payer: Self-pay | Admitting: Oncology

## 2017-07-28 ENCOUNTER — Telehealth: Payer: Self-pay | Admitting: Oncology

## 2017-07-28 DIAGNOSIS — Z510 Encounter for antineoplastic radiation therapy: Secondary | ICD-10-CM | POA: Diagnosis not present

## 2017-07-28 DIAGNOSIS — Z7189 Other specified counseling: Secondary | ICD-10-CM | POA: Insufficient documentation

## 2017-07-28 NOTE — Telephone Encounter (Signed)
Rcvd request from [facility], to [place] patient for [facility]/MD/ Carbo/Taxol for tomorrow 07/29/17. [facility]/MD / CARBO + TAXOL schd and conf with patient for 07/28/17, with Lucianne Lei p/u at 9 a.m.per.  [facility] is aware of patient added on for tomorrow. (patient was not checked out or schd from visit on 07/22/17) MF

## 2017-07-29 ENCOUNTER — Inpatient Hospital Stay: Payer: Medicare Other

## 2017-07-29 ENCOUNTER — Ambulatory Visit
Admission: RE | Admit: 2017-07-29 | Discharge: 2017-07-29 | Disposition: A | Discharge status: Home / Self Care | Payer: Medicare Other | Source: Ambulatory Visit | Attending: Radiation Oncology | Admitting: Radiation Oncology

## 2017-07-29 ENCOUNTER — Encounter: Payer: Self-pay | Admitting: Oncology

## 2017-07-29 ENCOUNTER — Inpatient Hospital Stay (HOSPITAL_BASED_OUTPATIENT_CLINIC_OR_DEPARTMENT_OTHER): Payer: Medicare Other | Admitting: Oncology

## 2017-07-29 ENCOUNTER — Ambulatory Visit: Payer: Medicare Other

## 2017-07-29 VITALS — BP 158/85 | HR 65 | Temp 97.6°F | Resp 18 | Ht 76.0 in | Wt 188.4 lb

## 2017-07-29 DIAGNOSIS — N184 Chronic kidney disease, stage 4 (severe): Secondary | ICD-10-CM | POA: Diagnosis not present

## 2017-07-29 DIAGNOSIS — C3492 Malignant neoplasm of unspecified part of left bronchus or lung: Secondary | ICD-10-CM

## 2017-07-29 DIAGNOSIS — R531 Weakness: Secondary | ICD-10-CM | POA: Diagnosis not present

## 2017-07-29 DIAGNOSIS — Z9981 Dependence on supplemental oxygen: Secondary | ICD-10-CM

## 2017-07-29 DIAGNOSIS — R5383 Other fatigue: Secondary | ICD-10-CM | POA: Diagnosis not present

## 2017-07-29 DIAGNOSIS — R600 Localized edema: Secondary | ICD-10-CM | POA: Diagnosis not present

## 2017-07-29 DIAGNOSIS — Z5111 Encounter for antineoplastic chemotherapy: Secondary | ICD-10-CM

## 2017-07-29 DIAGNOSIS — Z510 Encounter for antineoplastic radiation therapy: Secondary | ICD-10-CM | POA: Diagnosis not present

## 2017-07-29 DIAGNOSIS — Z8546 Personal history of malignant neoplasm of prostate: Secondary | ICD-10-CM

## 2017-07-29 DIAGNOSIS — I129 Hypertensive chronic kidney disease with stage 1 through stage 4 chronic kidney disease, or unspecified chronic kidney disease: Secondary | ICD-10-CM

## 2017-07-29 DIAGNOSIS — Z79899 Other long term (current) drug therapy: Secondary | ICD-10-CM

## 2017-07-29 DIAGNOSIS — Z87891 Personal history of nicotine dependence: Secondary | ICD-10-CM

## 2017-07-29 DIAGNOSIS — E785 Hyperlipidemia, unspecified: Secondary | ICD-10-CM

## 2017-07-29 DIAGNOSIS — J9611 Chronic respiratory failure with hypoxia: Secondary | ICD-10-CM

## 2017-07-29 DIAGNOSIS — D509 Iron deficiency anemia, unspecified: Secondary | ICD-10-CM

## 2017-07-29 DIAGNOSIS — K219 Gastro-esophageal reflux disease without esophagitis: Secondary | ICD-10-CM | POA: Diagnosis not present

## 2017-07-29 DIAGNOSIS — Z8701 Personal history of pneumonia (recurrent): Secondary | ICD-10-CM

## 2017-07-29 DIAGNOSIS — F17210 Nicotine dependence, cigarettes, uncomplicated: Secondary | ICD-10-CM

## 2017-07-29 DIAGNOSIS — J441 Chronic obstructive pulmonary disease with (acute) exacerbation: Secondary | ICD-10-CM

## 2017-07-29 DIAGNOSIS — Z800 Family history of malignant neoplasm of digestive organs: Secondary | ICD-10-CM

## 2017-07-29 LAB — CBC WITH DIFFERENTIAL/PLATELET
BASOS ABS: 0 10*3/uL (ref 0–0.1)
Basophils Relative: 0 %
Eosinophils Absolute: 0 10*3/uL (ref 0–0.7)
Eosinophils Relative: 1 %
HEMATOCRIT: 29 % — AB (ref 40.0–52.0)
Hemoglobin: 9.8 g/dL — ABNORMAL LOW (ref 13.0–18.0)
LYMPHS ABS: 0.7 10*3/uL — AB (ref 1.0–3.6)
LYMPHS PCT: 11 %
MCH: 25.1 pg — AB (ref 26.0–34.0)
MCHC: 33.7 g/dL (ref 32.0–36.0)
MCV: 74.3 fL — ABNORMAL LOW (ref 80.0–100.0)
Monocytes Absolute: 0.4 10*3/uL (ref 0.2–1.0)
Monocytes Relative: 6 %
NEUTROS ABS: 4.9 10*3/uL (ref 1.4–6.5)
Neutrophils Relative %: 82 %
Platelets: 186 10*3/uL (ref 150–440)
RBC: 3.9 MIL/uL — AB (ref 4.40–5.90)
RDW: 21.2 % — ABNORMAL HIGH (ref 11.5–14.5)
WBC: 5.9 10*3/uL (ref 3.8–10.6)

## 2017-07-29 LAB — COMPREHENSIVE METABOLIC PANEL
ALBUMIN: 3.8 g/dL (ref 3.5–5.0)
ALT: 33 U/L (ref 17–63)
ANION GAP: 9 (ref 5–15)
AST: 20 U/L (ref 15–41)
Alkaline Phosphatase: 53 U/L (ref 38–126)
BUN: 53 mg/dL — ABNORMAL HIGH (ref 6–20)
CHLORIDE: 107 mmol/L (ref 101–111)
CO2: 22 mmol/L (ref 22–32)
Calcium: 8.3 mg/dL — ABNORMAL LOW (ref 8.9–10.3)
Creatinine, Ser: 2.1 mg/dL — ABNORMAL HIGH (ref 0.61–1.24)
GFR calc Af Amer: 34 mL/min — ABNORMAL LOW (ref >60)
GFR, EST NON AFRICAN AMERICAN: 29 mL/min — AB (ref >60)
GLUCOSE: 143 mg/dL — AB (ref 65–99)
POTASSIUM: 3.7 mmol/L (ref 3.5–5.1)
Sodium: 138 mmol/L (ref 135–145)
TOTAL PROTEIN: 6.5 g/dL (ref 6.5–8.1)
Total Bilirubin: 0.6 mg/dL (ref 0.3–1.2)

## 2017-07-29 MED ORDER — SODIUM CHLORIDE 0.9 % IV SOLN
45.0000 mg/m2 | Freq: Once | INTRAVENOUS | Status: AC
  Administered 2017-07-29: 96 mg via INTRAVENOUS
  Filled 2017-07-29: qty 16

## 2017-07-29 MED ORDER — PALONOSETRON HCL INJECTION 0.25 MG/5ML
0.2500 mg | Freq: Once | INTRAVENOUS | Status: AC
  Administered 2017-07-29: 0.25 mg via INTRAVENOUS
  Filled 2017-07-29: qty 5

## 2017-07-29 MED ORDER — DIPHENHYDRAMINE HCL 50 MG/ML IJ SOLN
50.0000 mg | Freq: Once | INTRAMUSCULAR | Status: AC
  Administered 2017-07-29: 50 mg via INTRAVENOUS
  Filled 2017-07-29: qty 1

## 2017-07-29 MED ORDER — HEPARIN SOD (PORK) LOCK FLUSH 100 UNIT/ML IV SOLN: 500.0000 [IU] | Freq: Once | INTRAVENOUS | Status: DC | PRN

## 2017-07-29 MED ORDER — SODIUM CHLORIDE 0.9 % IV SOLN
110.0000 mg | Freq: Once | INTRAVENOUS | Status: AC
  Administered 2017-07-29: 110 mg via INTRAVENOUS
  Filled 2017-07-29: qty 11

## 2017-07-29 MED ORDER — SODIUM CHLORIDE 0.9 % IV SOLN
20.0000 mg | Freq: Once | INTRAVENOUS | Status: AC
  Administered 2017-07-29: 20 mg via INTRAVENOUS
  Filled 2017-07-29: qty 2

## 2017-07-29 MED ORDER — HEPARIN SOD (PORK) LOCK FLUSH 100 UNIT/ML IV SOLN
500.0000 [IU] | Freq: Once | INTRAVENOUS | Status: AC
  Administered 2017-07-29: 500 [IU] via INTRAVENOUS
  Filled 2017-07-29: qty 5

## 2017-07-29 MED ORDER — SODIUM CHLORIDE 0.9% FLUSH
10.0000 mL | Freq: Once | INTRAVENOUS | Status: AC
  Administered 2017-07-29: 10 mL via INTRAVENOUS
  Filled 2017-07-29: qty 10

## 2017-07-29 MED ORDER — SODIUM CHLORIDE 0.9 % IV SOLN
Freq: Once | INTRAVENOUS | Status: AC
  Administered 2017-07-29: 12:00:00 via INTRAVENOUS
  Filled 2017-07-29: qty 1000

## 2017-07-29 MED ORDER — FAMOTIDINE IN NACL 20-0.9 MG/50ML-% IV SOLN
20.0000 mg | Freq: Once | INTRAVENOUS | Status: AC
  Administered 2017-07-29: 20 mg via INTRAVENOUS
  Filled 2017-07-29: qty 50

## 2017-07-29 NOTE — Progress Notes (Signed)
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   HISTORY OF PRESENTING ILLNESS:  Billy Ortiz is a  77 y.o.  male with PMH listed below who was referred to me for evaluation of lung cancer.  #Follows up with pulmonologist Dr. Raul Del.  He was found to have lung nodules as well as mediastinal lymphadenopathy. Image work up # CT chest 04/25/17; New left retro hilar central lung mass versus adenopathy with resulting mass effect on the left lower lobe bronchus, worrisome for bronchogenic carcinoma. Subcarinal adenopathy has also progressed. Evaluation is complicated by the lack of intravenous contrast and the presence of underlying calcified mediastinal adenopathy consistent with previous granulomatous disease. Management options include bronchoscopy for tissue sampling and PET-CT. 2. No highly suspicious peripheral pulmonary nodules. There is a new perifissural nodule along the superior aspect of the right major fissure, measuring 5 mm. 3. Underlying moderate centrilobular emphysema. 4. Interval median sternotomy. Extensive aortic Atherosclerosis (ICD10-I70.0). 5. These results will be called to the ordering clinician or representative by the Radiologist Assistant, and communication documented in the PACS or zVision Dashboard.  05/10/2017 PET scan  1. Intense FDG uptake associated with the central left perihilar lung mass. Associated subcarinal and right paratracheal hypermetabolic lymph nodes identified. No evidence for distant metastatic disease. Assuming a non-small cell histology  imaging findings on today's study suggest T4N3M0 lesion or stage IIIB. 2.  Aortic Atherosclerosis (ICD10-I70.0). 3. 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 Recommend followup by ultrasound in 3 years. This recommendation follows ACR consensus guidelines: White Paper of the ACR Incidental Findings Committee II on Vascular Findings. J Am Coll Radiol 2013; 10:789-794. Aortic aneurysm NOS (ICD10-I71.9). 4.  Emphysema (ICD10-J43.9).  # 06/03/2017  Status post EBUS bronchoscopy which reviewed lesion endobronchially in the left mainstem, with spread to the left hilar node/mass as well as subcarinal node. Pathology revealed: A. Left main stem bronchus, DIAGNOSIS:  A. LEFT MAINSTEM BRONCHUS; FORCEPS BIOPSY: - SQUAMOUS CELL CARCINOMA.   .   # In April and May 2019, he was hospitalized twice due to hemoptysis, acute COPD exacerbation, and acute on chronic CKD, lower extremity edema   INTERVAL HISTORY Billy Ortiz is a 77 y.o. male who has above history reviewed by me today presents for chemotherapy management of lung cancer.  He received first cycle of concurrent Carboplatin and Taxol 1 week ago. He feels well at baseline.  Continue to have lower extremity swelling, Taks 80mg  daily.  Chronic SOB at baseline, he only uses home oxygen as needed.   Reports fatigued.  He reports that he spends more than 50% time during the day in chair or bed.  Shortness of breath is at baseline.  Review of Systems  Constitutional: Positive for malaise/fatigue. Negative for chills, fever and weight loss.  HENT: Negative for congestion, ear discharge, ear pain, nosebleeds, sinus pain and sore throat.   Eyes: Negative for double vision, photophobia, pain, discharge and redness.  Respiratory: Positive for cough, sputum production and shortness of breath. Negative for hemoptysis and wheezing.   Cardiovascular: Negative for chest pain, palpitations, orthopnea, claudication and leg swelling.  Gastrointestinal:  Negative for abdominal pain, blood in stool, constipation, diarrhea, heartburn, melena, nausea and  vomiting.  Genitourinary: Negative for dysuria, flank pain, frequency, hematuria and urgency.  Musculoskeletal: Negative for back pain, myalgias and neck pain.  Skin: Negative for itching and rash.  Neurological: Negative for dizziness, tingling, tremors, sensory change, focal weakness, weakness and headaches.  Endo/Heme/Allergies: Negative for environmental allergies. Does not bruise/bleed easily.  Psychiatric/Behavioral: Negative for depression, hallucinations, substance abuse and suicidal ideas. The patient is not nervous/anxious.     MEDICAL HISTORY:  Past Medical History:  Diagnosis Date  . Cancer [facility])    prostate  . Cataract   . Elevated lipids   . GERD (gastroesophageal reflux disease)   . HOH (hard of hearing)   . Hypercalcemia 07/21/2017  . Hypertension   . Pneumonia   . Pulmonary nodules/lesions, multiple   . Shortness of breath dyspnea     SURGICAL HISTORY: Past Surgical History:  Procedure Laterality Date  . BACK SURGERY     discectomy ,reports right foot drop since surgery   . CARDIAC CATHETERIZATION     2 stents  . CATARACT EXTRACTION W/PHACO Left 04/22/2015   Procedure: CATARACT EXTRACTION PHACO AND INTRAOCULAR LENS PLACEMENT (IOC);  Surgeon: Birder Robson, MD;  Location: [facility] ORS;  Service: Ophthalmology;  Laterality: Left;  [place]     1:17.8 AP%   25.7 CDE    20.03 fluid pack lot # [id number]  . COLONOSCOPY  2009  . COLONOSCOPY WITH PROPOFOL N/A 03/24/2016   Procedure: COLONOSCOPY WITH PROPOFOL;  Surgeon: Christene Lye, MD;  Location: [facility] ENDOSCOPY;  Service: Endoscopy;  Laterality: N/A;  . CORONARY ARTERY BYPASS GRAFT     single  . ENDOBRONCHIAL ULTRASOUND N/A 06/03/2017   Procedure: ENDOBRONCHIAL ULTRASOUND;  Surgeon: [name], MD;  Location: [facility] ORS;  Service: Pulmonary;  Laterality: N/A;  . ESOPHAGOGASTRODUODENOSCOPY (EGD) WITH PROPOFOL  N/A 03/24/2016   Procedure: ESOPHAGOGASTRODUODENOSCOPY (EGD) WITH PROPOFOL;  Surgeon: Christene Lye, MD;  Location: [facility] ENDOSCOPY;  Service: Endoscopy;  Laterality: N/A;  . EYE SURGERY     bilateral  . LEFT HEART CATH AND CORONARY ANGIOGRAPHY Left 05/26/2016   Procedure: Left Heart Cath and Coronary Angiography;  Surgeon: Corey Skains, MD;  Location: [place] [facility];  Service: Cardiovascular;  Laterality: Left;  . PORTA CATH INSERTION N/A 06/22/2017   Procedure: PORTA CATH INSERTION;  Surgeon: Algernon Huxley, MD;  Location: [place] [facility];  Service: Cardiovascular;  Laterality: N/A;    SOCIAL HISTORY: Social History   Socioeconomic History  . Marital status: Single    Spouse name: Not on file  . Number of children: Not on file  . Years of education: Not on file  . Highest education level: Not on file  Occupational History  . Not on file  Social Needs  . Financial resource strain: Not hard at all  . Food insecurity:    Worry: Never true    Inability: Never true  . Transportation needs:    Medical: No    Non-medical: No  Tobacco Use  . Smoking status: Former Smoker    Packs/day: 1.00    Years: 60.00    Pack years: 60.00    Types: Cigarettes    Last attempt to quit: 07/02/2017    Years since quitting: 0.0  . Smokeless tobacco: Never Used  Substance and Sexual Activity  . Alcohol use: No  . Drug use: No  . Sexual activity: Not on file  Lifestyle  . Physical activity:    Days per week: 0 days    Minutes per  session: 0 min  . Stress: Not at all  Relationships  . Social connections:    Talks on phone: Once a week    Gets together: Once a week    Attends religious service: 1 to 4 times per year    Active member of club or organization: No    Attends meetings of clubs or organizations: Never    Relationship status: Never married  . Intimate partner violence:    Fear of current or ex partner: No    Emotionally abused: No    Physically abused: No     Forced sexual activity: No  Other Topics Concern  . Not on file  Social History Narrative   Lives with sister    FAMILY HISTORY: Family History  Problem Relation Age of Onset  . Pancreatic cancer Brother   . Pancreatic cancer Paternal Aunt     ALLERGIES:  is allergic to tramadol and lisinopril.  MEDICATIONS:  Current Outpatient Medications  Medication Sig Dispense Refill  . albuterol (PROVENTIL HFA;VENTOLIN HFA) 108 (90 Base) MCG/ACT inhaler Inhale 2 puffs into the lungs every 6 (six) hours as needed for wheezing or shortness of breath. 1 Inhaler 0  . amLODipine (NORVASC) 10 MG tablet Take 1 tablet (10 mg total) by mouth daily. 90 tablet 0  . atorvastatin (LIPITOR) 40 MG tablet Take 40 mg by mouth daily at 6 PM.     . benzonatate (TESSALON) 200 MG capsule Take 1 capsule (200 mg total) by mouth 3 (three) times daily as needed for cough. 60 capsule 0  . docusate sodium (COLACE) 100 MG capsule Take 100 mg by mouth daily.    . fluticasone furoate-vilanterol (BREO ELLIPTA) 100-25 MCG/INH AEPB Inhale 1 puff into the lungs daily.    . folic acid (FOLVITE) 1 MG tablet Take 1 tablet (1 mg total) by mouth daily. 30 tablet 0  . furosemide (LASIX) 80 MG tablet Take 1 tablet (80 mg total) by mouth 2 (two) times daily. 60 tablet 0  . gabapentin (NEURONTIN) 100 MG capsule Take 1 capsule (100 mg total) by mouth 2 (two) times daily. 60 capsule 0  . guaiFENesin (MUCINEX) 600 MG 12 hr tablet Take 1 tablet (600 mg total) by mouth 2 (two) times daily. 30 tablet 0  . hydrALAZINE (APRESOLINE) 50 MG tablet Take 1.5 tablets (75 mg total) by mouth every 8 (eight) hours. 90 tablet 0  . Hydrocortisone (GERHARDT'S BUTT CREAM) CREA Apply 1 application topically 4 (four) times daily. 1 each 0  . isosorbide mononitrate (IMDUR) 60 MG 24 hr tablet Take 60 mg by mouth daily.    Marland Kitchen lidocaine-prilocaine (EMLA) cream Apply to affected area once 30 g 3  . metoprolol succinate (TOPROL-XL) 50 MG 24 hr tablet Take 1 tablet  (50 mg total) by mouth daily. Take with or immediately following a meal. 90 tablet 0  . nitroGLYCERIN (NITROSTAT) 0.4 MG SL tablet Place 0.4 mg under the tongue every 5 (five) minutes as needed for chest pain.    Marland Kitchen omeprazole (PRILOSEC) 20 MG capsule Take 20 mg by mouth 2 (two) times daily before a meal.     . ondansetron (ZOFRAN) 8 MG tablet Take 1 tablet (8 mg total) by mouth 2 (two) times daily as needed for refractory nausea / vomiting. Start on day 3 after chemo. 30 tablet 1  . predniSONE (DELTASONE) 20 MG tablet Take 2 tablets (40 mg total) by mouth daily with breakfast. 10 tablet 0  . prochlorperazine (COMPAZINE) 10 MG  tablet Take 1 tablet (10 mg total) by mouth every 6 (six) hours as needed (Nausea or vomiting). 30 tablet 1  . vitamin B-12 (CYANOCOBALAMIN) 500 MCG tablet Take 1,000 mcg by mouth daily.     Marland Kitchen witch hazel-glycerin (TUCKS) pad Apply topically as needed for itching. 40 each 0   No current facility-administered medications for this visit.    Facility-Administered Medications Ordered in Other Visits  Medication Dose Route Frequency Provider Last Rate Last Dose  . ceFAZolin (ANCEF) IVPB 2g/100 mL premix  2 g Intravenous Once Stegmayer, Kimberly A, PA-C         PHYSICAL EXAMINATION: ECOG PERFORMANCE STATUS: 2 - Symptomatic, <50% confined to bed Vitals:   07/29/17 1049  BP: (!) 158/85  Pulse: 65  Resp: 18  Temp: 97.6 F (36.4 C)   Filed Weights   07/29/17 1049  Weight: 188 lb 6.4 oz (85.5 kg)    Physical Exam  Constitutional: He is oriented to person, place, and time. He appears well-developed and well-nourished. No distress.  HENT:  Head: Normocephalic and atraumatic.  Right Ear: External ear normal.  Left Ear: External ear normal.  Mouth/Throat: Oropharynx is clear and moist.  Eyes: Pupils are equal, round, and reactive to light. Conjunctivae and EOM are normal. No scleral icterus.  Neck: Normal range of motion. Neck supple.  Cardiovascular: Normal rate,  regular rhythm and normal heart sounds.  No murmur heard. Pulmonary/Chest: Effort normal and breath sounds normal. No respiratory distress. He has no wheezes. He has no rales. He exhibits no tenderness.  Decreased breathsound bilaterally.   Abdominal: Soft. Bowel sounds are normal. He exhibits no distension and no mass. There is no tenderness.  Musculoskeletal: Normal range of motion. He exhibits edema. He exhibits no deformity.  Bilateral +3 pitting edema  Lymphadenopathy:    He has no cervical adenopathy.  Neurological: He is alert and oriented to person, place, and time. No cranial nerve deficit. Coordination normal.  Skin: Skin is warm and dry. No rash noted. No erythema.  Psychiatric: He has a normal mood and affect. His behavior is normal. Thought content normal.     LABORATORY DATA:  I have reviewed the data as listed Lab Results  Component Value Date   WBC 5.9 07/29/2017   HGB 9.8 (L) 07/29/2017   HCT 29.0 (L) 07/29/2017   MCV 74.3 (L) 07/29/2017   PLT 186 07/29/2017   Recent Labs    06/10/17 1111  06/21/17 0054  06/30/17 1831  07/15/17 0920 07/22/17 0903 07/29/17 1034  NA  --    < > 134*   < >  --    < > 138 136 138  K  --    < > 3.4*   < >  --    < > 3.9 3.6 3.7  CL  --    < > 104   < >  --    < > 101 99* 107  CO2  --    < > 24   < >  --    < > 26 27 22   GLUCOSE  --    < > 113*   < >  --    < > 124* 84 143*  BUN  --    < > 26*   < >  --    < > 77* 68* 53*  CREATININE  --    < > 2.91*   < >  --    < > 2.79* 2.52*  2.10*  CALCIUM  --    < > 8.0*   < >  --    < > 10.5* 10.0 8.3*  GFRNONAA  --    < > 20*   < >  --    < > 21* 23* 29*  GFRAA  --    < > 23*   < >  --    < > 24* 27* 34*  PROT 6.8  --  6.7  6.9  --  6.2*  --  7.0 6.8 6.5  ALBUMIN 3.3*  --  2.4*  2.6*   < > 2.7*  --  4.2 3.8 3.8  AST 13*  --  15  15  --  28  --  19 15 20   ALT 9*  --  8*  9*  --  38  --  60 28 33  ALKPHOS 89  --  97  94  --  69  --  62 52 53  BILITOT 1.1  --  0.9  0.9  --  0.6  --   0.9 1.0 0.6  BILIDIR 0.2  --  0.2  --  <0.1*  --   --   --   --   IBILI 0.9  --  0.7  --  NOT CALCULATED  --   --   --   --    < > = values in this interval not displayed.    MRI brain Jul 02, 2017 showed 1. No evidence for metastatic disease to the brain.  Small lesions may not be visualized without contrast however patient cannot receive contrast due to severe renal insufficiency.    ASSESSMENT & PLAN:  77yo Male present for evaluation for stage IIIC lung squamous carcinoma. 1. Squamous cell lung cancer, left ([place])   2. Chronic respiratory failure with hypoxia, on home O2 therapy (HCC)   3. Stage 4 chronic kidney disease ([place])   4. Lower extremity edema    Tolerates first cycle of chemotherapy well. He is on concurrent RT Proceed with cycle 2 carbo and Taxol.   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   # Respiratory failure/COPD/emphysema: continue inhalers as instructed.  # CKD/LE edema: follows up with nephrology. Continue lasix.  # Hypercalcemia: sp Xgeva on 07/22/2017.  Calcium level improved.  All questions were answered. The patient knows to call the clinic with any problems questions or concerns.  Return of visit: 1 week for assessment prior to cycle 3 chemotherapy     Earlie Server, MD, PhD Hematology Oncology [facility] Pager- [phone] 07/29/2017

## 2017-07-29 NOTE — Progress Notes (Signed)
Pt states that sometimes bowel movement is an issue and the stool softener does not work and he takes castor oil and it works for him

## 2017-07-29 NOTE — Progress Notes (Signed)
Per Dr. Tasia Catchings,  [name] can still receive chemotherapy treatment today with a creatinine level of 2.10.

## 2017-07-31 ENCOUNTER — Ambulatory Visit: Payer: Medicare Other

## 2017-08-01 ENCOUNTER — Ambulatory Visit
Admission: RE | Admit: 2017-08-01 | Discharge: 2017-08-01 | Disposition: A | Discharge status: Home / Self Care | Payer: Medicare Other | Source: Ambulatory Visit | Attending: Radiation Oncology | Admitting: Radiation Oncology

## 2017-08-01 ENCOUNTER — Ambulatory Visit: Payer: Medicare Other

## 2017-08-01 ENCOUNTER — Inpatient Hospital Stay: Payer: Medicare Other | Attending: Oncology

## 2017-08-01 DIAGNOSIS — Z79899 Other long term (current) drug therapy: Secondary | ICD-10-CM | POA: Insufficient documentation

## 2017-08-01 DIAGNOSIS — I129 Hypertensive chronic kidney disease with stage 1 through stage 4 chronic kidney disease, or unspecified chronic kidney disease: Secondary | ICD-10-CM | POA: Insufficient documentation

## 2017-08-01 DIAGNOSIS — J439 Emphysema, unspecified: Secondary | ICD-10-CM | POA: Insufficient documentation

## 2017-08-01 DIAGNOSIS — Z8546 Personal history of malignant neoplasm of prostate: Secondary | ICD-10-CM | POA: Insufficient documentation

## 2017-08-01 DIAGNOSIS — Z8701 Personal history of pneumonia (recurrent): Secondary | ICD-10-CM | POA: Insufficient documentation

## 2017-08-01 DIAGNOSIS — Z510 Encounter for antineoplastic radiation therapy: Secondary | ICD-10-CM | POA: Insufficient documentation

## 2017-08-01 DIAGNOSIS — J9690 Respiratory failure, unspecified, unspecified whether with hypoxia or hypercapnia: Secondary | ICD-10-CM | POA: Insufficient documentation

## 2017-08-01 DIAGNOSIS — C3492 Malignant neoplasm of unspecified part of left bronchus or lung: Secondary | ICD-10-CM | POA: Diagnosis present

## 2017-08-01 DIAGNOSIS — K219 Gastro-esophageal reflux disease without esophagitis: Secondary | ICD-10-CM | POA: Insufficient documentation

## 2017-08-01 DIAGNOSIS — I714 Abdominal aortic aneurysm, without rupture: Secondary | ICD-10-CM | POA: Insufficient documentation

## 2017-08-01 DIAGNOSIS — I700 Atherosclerosis of aorta: Secondary | ICD-10-CM | POA: Insufficient documentation

## 2017-08-01 DIAGNOSIS — Z87891 Personal history of nicotine dependence: Secondary | ICD-10-CM | POA: Insufficient documentation

## 2017-08-01 DIAGNOSIS — N189 Chronic kidney disease, unspecified: Secondary | ICD-10-CM | POA: Insufficient documentation

## 2017-08-01 DIAGNOSIS — C3482 Malignant neoplasm of overlapping sites of left bronchus and lung: Secondary | ICD-10-CM | POA: Insufficient documentation

## 2017-08-01 DIAGNOSIS — Z923 Personal history of irradiation: Secondary | ICD-10-CM | POA: Insufficient documentation

## 2017-08-01 DIAGNOSIS — M7989 Other specified soft tissue disorders: Secondary | ICD-10-CM | POA: Insufficient documentation

## 2017-08-01 DIAGNOSIS — R600 Localized edema: Secondary | ICD-10-CM | POA: Insufficient documentation

## 2017-08-01 DIAGNOSIS — Z5111 Encounter for antineoplastic chemotherapy: Secondary | ICD-10-CM | POA: Insufficient documentation

## 2017-08-01 DIAGNOSIS — D509 Iron deficiency anemia, unspecified: Secondary | ICD-10-CM | POA: Insufficient documentation

## 2017-08-02 ENCOUNTER — Ambulatory Visit: Payer: Medicare Other

## 2017-08-02 ENCOUNTER — Ambulatory Visit
Admission: RE | Admit: 2017-08-02 | Discharge: 2017-08-02 | Disposition: A | Discharge status: Home / Self Care | Payer: Medicare Other | Source: Ambulatory Visit | Attending: Radiation Oncology | Admitting: Radiation Oncology

## 2017-08-02 ENCOUNTER — Inpatient Hospital Stay: Payer: Medicare Other

## 2017-08-02 DIAGNOSIS — Z510 Encounter for antineoplastic radiation therapy: Secondary | ICD-10-CM | POA: Diagnosis not present

## 2017-08-03 ENCOUNTER — Ambulatory Visit: Payer: Medicare Other

## 2017-08-03 ENCOUNTER — Ambulatory Visit
Admission: RE | Admit: 2017-08-03 | Discharge: 2017-08-03 | Disposition: A | Discharge status: Home / Self Care | Payer: Medicare Other | Source: Ambulatory Visit | Attending: Radiation Oncology | Admitting: Radiation Oncology

## 2017-08-03 ENCOUNTER — Inpatient Hospital Stay: Payer: Medicare Other

## 2017-08-03 DIAGNOSIS — Z510 Encounter for antineoplastic radiation therapy: Secondary | ICD-10-CM | POA: Diagnosis not present

## 2017-08-04 ENCOUNTER — Ambulatory Visit: Payer: Medicare Other

## 2017-08-04 ENCOUNTER — Ambulatory Visit
Admission: RE | Admit: 2017-08-04 | Discharge: 2017-08-04 | Disposition: A | Discharge status: Home / Self Care | Payer: Medicare Other | Source: Ambulatory Visit | Attending: Radiation Oncology | Admitting: Radiation Oncology

## 2017-08-04 DIAGNOSIS — Z510 Encounter for antineoplastic radiation therapy: Secondary | ICD-10-CM | POA: Diagnosis not present

## 2017-08-05 ENCOUNTER — Inpatient Hospital Stay (HOSPITAL_BASED_OUTPATIENT_CLINIC_OR_DEPARTMENT_OTHER): Payer: Medicare Other | Admitting: Oncology

## 2017-08-05 ENCOUNTER — Inpatient Hospital Stay: Payer: Medicare Other

## 2017-08-05 ENCOUNTER — Other Ambulatory Visit: Payer: Self-pay

## 2017-08-05 ENCOUNTER — Ambulatory Visit: Payer: Medicare Other

## 2017-08-05 ENCOUNTER — Encounter: Payer: Self-pay | Admitting: Oncology

## 2017-08-05 ENCOUNTER — Ambulatory Visit
Admission: RE | Admit: 2017-08-05 | Discharge: 2017-08-05 | Disposition: A | Discharge status: Home / Self Care | Payer: Medicare Other | Source: Ambulatory Visit | Attending: Radiation Oncology | Admitting: Radiation Oncology

## 2017-08-05 VITALS — BP 134/72 | HR 61 | Temp 95.8°F | Wt 181.0 lb

## 2017-08-05 DIAGNOSIS — J9690 Respiratory failure, unspecified, unspecified whether with hypoxia or hypercapnia: Secondary | ICD-10-CM | POA: Diagnosis not present

## 2017-08-05 DIAGNOSIS — Z8701 Personal history of pneumonia (recurrent): Secondary | ICD-10-CM | POA: Diagnosis not present

## 2017-08-05 DIAGNOSIS — Z923 Personal history of irradiation: Secondary | ICD-10-CM | POA: Diagnosis not present

## 2017-08-05 DIAGNOSIS — Z8546 Personal history of malignant neoplasm of prostate: Secondary | ICD-10-CM

## 2017-08-05 DIAGNOSIS — N189 Chronic kidney disease, unspecified: Secondary | ICD-10-CM | POA: Diagnosis not present

## 2017-08-05 DIAGNOSIS — Z87891 Personal history of nicotine dependence: Secondary | ICD-10-CM

## 2017-08-05 DIAGNOSIS — I700 Atherosclerosis of aorta: Secondary | ICD-10-CM

## 2017-08-05 DIAGNOSIS — D509 Iron deficiency anemia, unspecified: Secondary | ICD-10-CM | POA: Diagnosis not present

## 2017-08-05 DIAGNOSIS — C3482 Malignant neoplasm of overlapping sites of left bronchus and lung: Secondary | ICD-10-CM | POA: Diagnosis not present

## 2017-08-05 DIAGNOSIS — Z79899 Other long term (current) drug therapy: Secondary | ICD-10-CM

## 2017-08-05 DIAGNOSIS — R600 Localized edema: Secondary | ICD-10-CM | POA: Diagnosis not present

## 2017-08-05 DIAGNOSIS — I714 Abdominal aortic aneurysm, without rupture: Secondary | ICD-10-CM

## 2017-08-05 DIAGNOSIS — J439 Emphysema, unspecified: Secondary | ICD-10-CM | POA: Diagnosis not present

## 2017-08-05 DIAGNOSIS — I129 Hypertensive chronic kidney disease with stage 1 through stage 4 chronic kidney disease, or unspecified chronic kidney disease: Secondary | ICD-10-CM | POA: Diagnosis not present

## 2017-08-05 DIAGNOSIS — J9611 Chronic respiratory failure with hypoxia: Secondary | ICD-10-CM

## 2017-08-05 DIAGNOSIS — K219 Gastro-esophageal reflux disease without esophagitis: Secondary | ICD-10-CM | POA: Diagnosis not present

## 2017-08-05 DIAGNOSIS — Z5111 Encounter for antineoplastic chemotherapy: Secondary | ICD-10-CM | POA: Diagnosis present

## 2017-08-05 DIAGNOSIS — C3492 Malignant neoplasm of unspecified part of left bronchus or lung: Secondary | ICD-10-CM

## 2017-08-05 DIAGNOSIS — M7989 Other specified soft tissue disorders: Secondary | ICD-10-CM

## 2017-08-05 DIAGNOSIS — Z510 Encounter for antineoplastic radiation therapy: Secondary | ICD-10-CM | POA: Diagnosis not present

## 2017-08-05 DIAGNOSIS — Z9981 Dependence on supplemental oxygen: Secondary | ICD-10-CM

## 2017-08-05 LAB — COMPREHENSIVE METABOLIC PANEL
ALBUMIN: 3.8 g/dL (ref 3.5–5.0)
ALK PHOS: 53 U/L (ref 38–126)
ALT: 22 U/L (ref 17–63)
AST: 17 U/L (ref 15–41)
Anion gap: 9 (ref 5–15)
BILIRUBIN TOTAL: 1 mg/dL (ref 0.3–1.2)
BUN: 51 mg/dL — ABNORMAL HIGH (ref 6–20)
CALCIUM: 8.9 mg/dL (ref 8.9–10.3)
CO2: 25 mmol/L (ref 22–32)
CREATININE: 2.56 mg/dL — AB (ref 0.61–1.24)
Chloride: 102 mmol/L (ref 101–111)
GFR calc Af Amer: 26 mL/min — ABNORMAL LOW (ref >60)
GFR calc non Af Amer: 23 mL/min — ABNORMAL LOW (ref >60)
GLUCOSE: 156 mg/dL — AB (ref 65–99)
Potassium: 3.7 mmol/L (ref 3.5–5.1)
Sodium: 136 mmol/L (ref 135–145)
TOTAL PROTEIN: 6.6 g/dL (ref 6.5–8.1)

## 2017-08-05 LAB — CBC WITH DIFFERENTIAL/PLATELET
BASOS ABS: 0 10*3/uL (ref 0–0.1)
BASOS PCT: 1 %
EOS ABS: 0 10*3/uL (ref 0–0.7)
EOS PCT: 1 %
HEMATOCRIT: 30.3 % — AB (ref 40.0–52.0)
Hemoglobin: 10.1 g/dL — ABNORMAL LOW (ref 13.0–18.0)
Lymphocytes Relative: 12 %
Lymphs Abs: 0.5 10*3/uL — ABNORMAL LOW (ref 1.0–3.6)
MCH: 25 pg — ABNORMAL LOW (ref 26.0–34.0)
MCHC: 33.3 g/dL (ref 32.0–36.0)
MCV: 75.1 fL — ABNORMAL LOW (ref 80.0–100.0)
MONO ABS: 0.3 10*3/uL (ref 0.2–1.0)
Monocytes Relative: 9 %
NEUTROS ABS: 3 10*3/uL (ref 1.4–6.5)
Neutrophils Relative %: 77 %
PLATELETS: 167 10*3/uL (ref 150–440)
RBC: 4.03 MIL/uL — ABNORMAL LOW (ref 4.40–5.90)
RDW: 21.3 % — ABNORMAL HIGH (ref 11.5–14.5)
WBC: 3.8 10*3/uL (ref 3.8–10.6)

## 2017-08-05 MED ORDER — DIPHENHYDRAMINE HCL 50 MG/ML IJ SOLN
50.0000 mg | Freq: Once | INTRAMUSCULAR | Status: AC
  Administered 2017-08-05: 50 mg via INTRAVENOUS
  Filled 2017-08-05: qty 1

## 2017-08-05 MED ORDER — HEPARIN SOD (PORK) LOCK FLUSH 100 UNIT/ML IV SOLN
500.0000 [IU] | Freq: Once | INTRAVENOUS | Status: AC
  Administered 2017-08-05: 500 [IU] via INTRAVENOUS
  Filled 2017-08-05: qty 5

## 2017-08-05 MED ORDER — HEPARIN SOD (PORK) LOCK FLUSH 100 UNIT/ML IV SOLN: 500.0000 [IU] | Freq: Once | INTRAVENOUS | Status: DC | PRN

## 2017-08-05 MED ORDER — SODIUM CHLORIDE 0.9 % IV SOLN
20.0000 mg | Freq: Once | INTRAVENOUS | Status: AC
  Administered 2017-08-05: 20 mg via INTRAVENOUS
  Filled 2017-08-05: qty 2

## 2017-08-05 MED ORDER — SODIUM CHLORIDE 0.9 % IV SOLN
45.0000 mg/m2 | Freq: Once | INTRAVENOUS | Status: AC
  Administered 2017-08-05: 96 mg via INTRAVENOUS
  Filled 2017-08-05: qty 16

## 2017-08-05 MED ORDER — SODIUM CHLORIDE 0.9% FLUSH
10.0000 mL | Freq: Once | INTRAVENOUS | Status: AC
  Administered 2017-08-05: 10 mL via INTRAVENOUS
  Filled 2017-08-05: qty 10

## 2017-08-05 MED ORDER — SODIUM CHLORIDE 0.9% FLUSH
10.0000 mL | INTRAVENOUS | Status: DC | PRN
  Filled 2017-08-05: qty 10

## 2017-08-05 MED ORDER — PALONOSETRON HCL INJECTION 0.25 MG/5ML
0.2500 mg | Freq: Once | INTRAVENOUS | Status: AC
  Administered 2017-08-05: 0.25 mg via INTRAVENOUS
  Filled 2017-08-05: qty 5

## 2017-08-05 MED ORDER — SODIUM CHLORIDE 0.9 % IV SOLN
110.0000 mg | Freq: Once | INTRAVENOUS | Status: AC
  Administered 2017-08-05: 110 mg via INTRAVENOUS
  Filled 2017-08-05: qty 11

## 2017-08-05 MED ORDER — FAMOTIDINE IN NACL 20-0.9 MG/50ML-% IV SOLN
20.0000 mg | Freq: Once | INTRAVENOUS | Status: AC
  Administered 2017-08-05: 20 mg via INTRAVENOUS
  Filled 2017-08-05: qty 50

## 2017-08-05 MED ORDER — SODIUM CHLORIDE 0.9 % IV SOLN
Freq: Once | INTRAVENOUS | Status: AC
  Administered 2017-08-05: 10:00:00 via INTRAVENOUS
  Filled 2017-08-05: qty 1000

## 2017-08-05 NOTE — Progress Notes (Signed)
Creatinine 2.56. Per Almyra Free CMA per Dr. Tasia Catchings okay to proceed with treatment.

## 2017-08-05 NOTE — Progress Notes (Signed)
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   HISTORY OF PRESENTING ILLNESS:  Billy Ortiz is a  77 y.o.  male with PMH listed below who was referred to me for evaluation of lung cancer.  #Follows up with pulmonologist Dr. Raul Del.  He was found to have lung nodules as well as mediastinal lymphadenopathy. Image work up # CT chest 04/25/17; New left retro hilar central lung mass versus adenopathy with resulting mass effect on the left lower lobe bronchus, worrisome for bronchogenic carcinoma. Subcarinal adenopathy has also progressed. Evaluation is complicated by the lack of intravenous contrast and the presence of underlying calcified mediastinal adenopathy consistent with previous granulomatous disease. Management options include bronchoscopy for tissue sampling and PET-CT. 2. No highly suspicious peripheral pulmonary nodules. There is a new perifissural nodule along the superior aspect of the right major fissure, measuring 5 mm. 3. Underlying moderate centrilobular emphysema. 4. Interval median sternotomy. Extensive aortic Atherosclerosis (ICD10-I70.0). 5. These results will be called to the ordering clinician or representative by the Radiologist Assistant, and communication documented in the PACS or zVision Dashboard.  05/10/2017 PET scan  1. Intense FDG uptake associated with the central left perihilar lung mass. Associated subcarinal and right paratracheal hypermetabolic lymph nodes identified. No evidence for distant metastatic disease. Assuming a non-small cell histology  imaging findings on today's study suggest T4N3M0 lesion or stage IIIB. 2.  Aortic Atherosclerosis (ICD10-I70.0). 3. 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 Recommend followup by ultrasound in 3 years. This recommendation follows ACR consensus guidelines: White Paper of the ACR Incidental Findings Committee II on Vascular Findings. J Am Coll Radiol 2013; 10:789-794. Aortic aneurysm NOS (ICD10-I71.9). 4.  Emphysema (ICD10-J43.9).  # 06/03/2017  Status post EBUS bronchoscopy which reviewed lesion endobronchially in the left mainstem, with spread to the left hilar node/mass as well as subcarinal node. Pathology revealed: A. Left main stem bronchus, DIAGNOSIS:  A. LEFT MAINSTEM BRONCHUS; FORCEPS BIOPSY: - SQUAMOUS CELL CARCINOMA.   .   # In April and May 2019, he was hospitalized twice due to hemoptysis, acute COPD exacerbation, and acute on chronic CKD, lower extremity edema   INTERVAL HISTORY Billy Ortiz is a 77 y.o. male who has above history reviewed by me today presents for assessment prior to chemotherapy management for stage IIIc squamous lung cancer. He has been on concurrent chemo and radiation with carboplatin and Taxol weekly.  He reports feeling well.   Fatigue: Chronic, stable. Shortness of breath, chronic, stable.  He only uses home oxygen if needed. Lower extremity swelling: Due to chronic kidney disease, stable. Cough: improved.   Review of Systems  Constitutional: Positive for malaise/fatigue. Negative for chills, fever and weight loss.  HENT: Negative for congestion, ear discharge, ear pain, nosebleeds, sinus pain and sore throat.   Eyes: Negative for double vision, photophobia, pain, discharge and redness.  Respiratory: Positive for sputum production and shortness of breath. Negative for cough, hemoptysis and wheezing.   Cardiovascular: Negative for chest pain, palpitations, orthopnea, claudication and leg swelling.  Gastrointestinal: Negative for abdominal  pain, blood in stool, constipation, diarrhea, heartburn, melena, nausea and vomiting.  Genitourinary: Negative for dysuria, flank pain, frequency, hematuria and urgency.  Musculoskeletal:  Negative for back pain, myalgias and neck pain.  Skin: Negative for itching and rash.  Neurological: Negative for dizziness, tingling, tremors, sensory change, focal weakness, weakness and headaches.  Endo/Heme/Allergies: Negative for environmental allergies. Does not bruise/bleed easily.  Psychiatric/Behavioral: Negative for depression, hallucinations, substance abuse and suicidal ideas. The patient is not nervous/anxious.     MEDICAL HISTORY:  Past Medical History:  Diagnosis Date  . Cancer [facility])    prostate  . Cataract   . Elevated lipids   . GERD (gastroesophageal reflux disease)   . HOH (hard of hearing)   . Hypercalcemia 07/21/2017  . Hypertension   . Pneumonia   . Pulmonary nodules/lesions, multiple   . Shortness of breath dyspnea     SURGICAL HISTORY: Past Surgical History:  Procedure Laterality Date  . BACK SURGERY     discectomy ,reports right foot drop since surgery   . CARDIAC CATHETERIZATION     2 stents  . CATARACT EXTRACTION W/PHACO Left 04/22/2015   Procedure: CATARACT EXTRACTION PHACO AND INTRAOCULAR LENS PLACEMENT (IOC);  Surgeon: Birder Robson, MD;  Location: [facility] ORS;  Service: Ophthalmology;  Laterality: Left;  [place]     1:17.8 AP%   25.7 CDE    20.03 fluid pack lot # [id number]  . COLONOSCOPY  2009  . COLONOSCOPY WITH PROPOFOL N/A 03/24/2016   Procedure: COLONOSCOPY WITH PROPOFOL;  Surgeon: Christene Lye, MD;  Location: [facility] ENDOSCOPY;  Service: Endoscopy;  Laterality: N/A;  . CORONARY ARTERY BYPASS GRAFT     single  . ENDOBRONCHIAL ULTRASOUND N/A 06/03/2017   Procedure: ENDOBRONCHIAL ULTRASOUND;  Surgeon: [name], MD;  Location: [facility] ORS;  Service: Pulmonary;  Laterality: N/A;  . ESOPHAGOGASTRODUODENOSCOPY (EGD) WITH PROPOFOL N/A 03/24/2016    Procedure: ESOPHAGOGASTRODUODENOSCOPY (EGD) WITH PROPOFOL;  Surgeon: Christene Lye, MD;  Location: [facility] ENDOSCOPY;  Service: Endoscopy;  Laterality: N/A;  . EYE SURGERY     bilateral  . LEFT HEART CATH AND CORONARY ANGIOGRAPHY Left 05/26/2016   Procedure: Left Heart Cath and Coronary Angiography;  Surgeon: Corey Skains, MD;  Location: [place] CV LAB;  Service: Cardiovascular;  Laterality: Left;  . PORTA CATH INSERTION N/A 06/22/2017   Procedure: PORTA CATH INSERTION;  Surgeon: Algernon Huxley, MD;  Location: [place] CV LAB;  Service: Cardiovascular;  Laterality: N/A;    SOCIAL HISTORY: Social History   Socioeconomic History  . Marital status: Single    Spouse name: Not on file  . Number of children: Not on file  . Years of education: Not on file  . Highest education level: Not on file  Occupational History  . Not on file  Social Needs  . Financial resource strain: Not hard at all  . Food insecurity:    Worry: Never true    Inability: Never true  . Transportation needs:    Medical: No    Non-medical: No  Tobacco Use  . Smoking status: Former Smoker    Packs/day: 1.00    Years: 60.00    Pack years: 60.00    Types: Cigarettes    Last attempt to quit: 07/02/2017    Years since quitting: 0.0  . Smokeless tobacco: Never Used  Substance and Sexual Activity  . Alcohol use: No  . Drug use: No  . Sexual activity: Not on file  Lifestyle  . Physical activity:    Days per week: 0 days    Minutes per session: 0 min  . Stress: Not at all  Relationships  . Social  connections:    Talks on phone: Once a week    Gets together: Once a week    Attends religious service: 1 to 4 times per year    Active member of club or organization: No    Attends meetings of clubs or organizations: Never    Relationship status: Never married  . Intimate partner violence:    Fear of current or ex partner: No    Emotionally abused: No    Physically abused: No    Forced sexual  activity: No  Other Topics Concern  . Not on file  Social History Narrative   Lives with sister    FAMILY HISTORY: Family History  Problem Relation Age of Onset  . Pancreatic cancer Brother   . Pancreatic cancer Paternal Aunt     ALLERGIES:  is allergic to tramadol and lisinopril.  MEDICATIONS:  Current Outpatient Medications  Medication Sig Dispense Refill  . albuterol (PROVENTIL HFA;VENTOLIN HFA) 108 (90 Base) MCG/ACT inhaler Inhale 2 puffs into the lungs every 6 (six) hours as needed for wheezing or shortness of breath. 1 Inhaler 0  . amLODipine (NORVASC) 10 MG tablet Take 1 tablet (10 mg total) by mouth daily. 90 tablet 0  . atorvastatin (LIPITOR) 40 MG tablet Take 40 mg by mouth daily at 6 PM.     . benzonatate (TESSALON) 200 MG capsule Take 1 capsule (200 mg total) by mouth 3 (three) times daily as needed for cough. 60 capsule 0  . docusate sodium (COLACE) 100 MG capsule Take 100 mg by mouth daily.    . fluticasone furoate-vilanterol (BREO ELLIPTA) 100-25 MCG/INH AEPB Inhale 1 puff into the lungs daily.    . folic acid (FOLVITE) 1 MG tablet Take 1 tablet (1 mg total) by mouth daily. 30 tablet 0  . furosemide (LASIX) 80 MG tablet Take 1 tablet (80 mg total) by mouth 2 (two) times daily. 60 tablet 0  . gabapentin (NEURONTIN) 100 MG capsule Take 1 capsule (100 mg total) by mouth 2 (two) times daily. 60 capsule 0  . guaiFENesin (MUCINEX) 600 MG 12 hr tablet Take 1 tablet (600 mg total) by mouth 2 (two) times daily. 30 tablet 0  . hydrALAZINE (APRESOLINE) 50 MG tablet Take 1.5 tablets (75 mg total) by mouth every 8 (eight) hours. 90 tablet 0  . Hydrocortisone (GERHARDT'S BUTT CREAM) CREA Apply 1 application topically 4 (four) times daily. 1 each 0  . isosorbide mononitrate (IMDUR) 60 MG 24 hr tablet Take 60 mg by mouth daily.    Marland Kitchen lidocaine-prilocaine (EMLA) cream Apply to affected area once 30 g 3  . metoprolol succinate (TOPROL-XL) 50 MG 24 hr tablet Take 1 tablet (50 mg total) by  mouth daily. Take with or immediately following a meal. 90 tablet 0  . nitroGLYCERIN (NITROSTAT) 0.4 MG SL tablet Place 0.4 mg under the tongue every 5 (five) minutes as needed for chest pain.    Marland Kitchen omeprazole (PRILOSEC) 20 MG capsule Take 20 mg by mouth 2 (two) times daily before a meal.     . ondansetron (ZOFRAN) 8 MG tablet Take 1 tablet (8 mg total) by mouth 2 (two) times daily as needed for refractory nausea / vomiting. Start on day 3 after chemo. 30 tablet 1  . predniSONE (DELTASONE) 20 MG tablet Take 2 tablets (40 mg total) by mouth daily with breakfast. 10 tablet 0  . prochlorperazine (COMPAZINE) 10 MG tablet Take 1 tablet (10 mg total) by mouth every 6 (six) hours as  needed (Nausea or vomiting). 30 tablet 1  . vitamin B-12 (CYANOCOBALAMIN) 500 MCG tablet Take 1,000 mcg by mouth daily.     Marland Kitchen witch hazel-glycerin (TUCKS) pad Apply topically as needed for itching. 40 each 0   No current facility-administered medications for this visit.    Facility-Administered Medications Ordered in Other Visits  Medication Dose Route Frequency Provider Last Rate Last Dose  . ceFAZolin (ANCEF) IVPB 2g/100 mL premix  2 g Intravenous Once Stegmayer, Kimberly A, PA-C         PHYSICAL EXAMINATION: ECOG PERFORMANCE STATUS: 2 - Symptomatic, <50% confined to bed Vitals:   08/05/17 0933  BP: 134/72  Pulse: 61  Temp: (!) 95.8 F (35.4 C)  SpO2: 98%   Filed Weights   08/05/17 0933  Weight: 181 lb (82.1 kg)    Physical Exam  Constitutional: He is oriented to person, place, and time. He appears well-developed and well-nourished. No distress.  HENT:  Head: Normocephalic and atraumatic.  Right Ear: External ear normal.  Left Ear: External ear normal.  Mouth/Throat: Oropharynx is clear and moist.  Eyes: Pupils are equal, round, and reactive to light. Conjunctivae and EOM are normal. No scleral icterus.  Neck: Normal range of motion. Neck supple.  Cardiovascular: Normal rate, regular rhythm and normal  heart sounds.  No murmur heard. Pulmonary/Chest: Effort normal and breath sounds normal. No respiratory distress. He has no wheezes. He has no rales. He exhibits no tenderness.  Decreased breathsound bilaterally.   Abdominal: Soft. Bowel sounds are normal. He exhibits no distension and no mass. There is no tenderness.  Musculoskeletal: Normal range of motion. He exhibits edema. He exhibits no deformity.  Bilateral +2 pitting edema  Lymphadenopathy:    He has no cervical adenopathy.  Neurological: He is alert and oriented to person, place, and time. No cranial nerve deficit. Coordination normal.  Skin: Skin is warm and dry. No rash noted. No erythema.  Psychiatric: He has a normal mood and affect. His behavior is normal. Thought content normal.     LABORATORY DATA:  I have reviewed the data as listed Lab Results  Component Value Date   WBC 3.8 08/05/2017   HGB 10.1 (L) 08/05/2017   HCT 30.3 (L) 08/05/2017   MCV 75.1 (L) 08/05/2017   PLT 167 08/05/2017   Recent Labs    06/10/17 1111  06/21/17 0054  06/30/17 1831  07/22/17 0903 07/29/17 1034 08/05/17 0911  NA  --    < > 134*   < >  --    < > 136 138 136  K  --    < > 3.4*   < >  --    < > 3.6 3.7 3.7  CL  --    < > 104   < >  --    < > 99* 107 102  CO2  --    < > 24   < >  --    < > 27 22 25   GLUCOSE  --    < > 113*   < >  --    < > 84 143* 156*  BUN  --    < > 26*   < >  --    < > 68* 53* 51*  CREATININE  --    < > 2.91*   < >  --    < > 2.52* 2.10* 2.56*  CALCIUM  --    < > 8.0*   < >  --    < >  10.0 8.3* 8.9  GFRNONAA  --    < > 20*   < >  --    < > 23* 29* 23*  GFRAA  --    < > 23*   < >  --    < > 27* 34* 26*  PROT 6.8  --  6.7  6.9  --  6.2*   < > 6.8 6.5 6.6  ALBUMIN 3.3*  --  2.4*  2.6*   < > 2.7*   < > 3.8 3.8 3.8  AST 13*  --  15  15  --  28   < > 15 20 17   ALT 9*  --  8*  9*  --  38   < > 28 33 22  ALKPHOS 89  --  97  94  --  69   < > 52 53 53  BILITOT 1.1  --  0.9  0.9  --  0.6   < > 1.0 0.6 1.0    BILIDIR 0.2  --  0.2  --  <0.1*  --   --   --   --   IBILI 0.9  --  0.7  --  NOT CALCULATED  --   --   --   --    < > = values in this interval not displayed.    MRI brain Jul 02, 2017 showed 1. No evidence for metastatic disease to the brain.  Small lesions may not be visualized without contrast however patient cannot receive contrast due to severe renal insufficiency.    ASSESSMENT & PLAN:  77yo Male present for evaluation for stage IIIC lung squamous carcinoma. 1. Squamous cell lung cancer, left ([place])   2. Encounter for antineoplastic chemotherapy   3. Lower extremity edema   4. Chronic respiratory failure with hypoxia, on home O2 therapy (HCC)   5. Hypercalcemia   6. Microcytic anemia    #Lung cancer: Stable, tolerating weekly carboplatin and Taxol with concurrent radiation.  He has to complete 1 more week of radiation. Labs reviewed.  Counts are acceptable.  We will proceed today's weekly carboplatin and Taxol. He will follow-up in 1 week for assessment and final dose of chemotherapy.  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  Stable, continue monitor  # Respiratory failure/COPD/emphysema: Nasal cannula oxygen at home as needed.  Continue COPD inhalers. # CKD/LE edema: follows up with nephrology. Continue lasix.  Creatinine stable, at 2.56. # Hypercalcemia: sp Xgeva on 07/22/2017.  Hypercalcemia resolved calcium improved to 8.9.  Continue monitor   All questions were answered. The patient knows to call the clinic with any problems questions or concerns.  Return of visit: 1 week for assessment prior to chemotherapy Total face to face encounter time for this patient visit was 52min. >50% of the time was  spent in counseling and coordination of care.     Earlie Server, MD, PhD Hematology Oncology [facility] Pager- [phone] 08/05/2017

## 2017-08-06 ENCOUNTER — Ambulatory Visit: Payer: Medicare Other

## 2017-08-08 ENCOUNTER — Ambulatory Visit: Payer: Medicare Other

## 2017-08-08 ENCOUNTER — Ambulatory Visit
Admission: RE | Admit: 2017-08-08 | Discharge: 2017-08-08 | Disposition: A | Discharge status: Home / Self Care | Payer: Medicare Other | Source: Ambulatory Visit | Attending: Radiation Oncology | Admitting: Radiation Oncology

## 2017-08-08 DIAGNOSIS — Z510 Encounter for antineoplastic radiation therapy: Secondary | ICD-10-CM | POA: Diagnosis not present

## 2017-08-09 ENCOUNTER — Ambulatory Visit: Payer: Medicare Other

## 2017-08-09 ENCOUNTER — Ambulatory Visit
Admission: RE | Admit: 2017-08-09 | Discharge: 2017-08-09 | Disposition: A | Discharge status: Home / Self Care | Payer: Medicare Other | Source: Ambulatory Visit | Attending: Radiation Oncology | Admitting: Radiation Oncology

## 2017-08-09 ENCOUNTER — Encounter: Payer: Self-pay | Admitting: Oncology

## 2017-08-09 DIAGNOSIS — Z510 Encounter for antineoplastic radiation therapy: Secondary | ICD-10-CM | POA: Diagnosis not present

## 2017-08-10 ENCOUNTER — Ambulatory Visit
Admission: RE | Admit: 2017-08-10 | Discharge: 2017-08-10 | Disposition: A | Discharge status: Home / Self Care | Payer: Medicare Other | Source: Ambulatory Visit | Attending: Radiation Oncology | Admitting: Radiation Oncology

## 2017-08-10 ENCOUNTER — Ambulatory Visit: Payer: Medicare Other

## 2017-08-10 DIAGNOSIS — Z510 Encounter for antineoplastic radiation therapy: Secondary | ICD-10-CM | POA: Diagnosis not present

## 2017-08-11 ENCOUNTER — Ambulatory Visit
Admission: RE | Admit: 2017-08-11 | Discharge: 2017-08-11 | Disposition: A | Discharge status: Home / Self Care | Payer: Medicare Other | Source: Ambulatory Visit | Attending: Radiation Oncology | Admitting: Radiation Oncology

## 2017-08-11 ENCOUNTER — Ambulatory Visit: Payer: Medicare Other

## 2017-08-11 DIAGNOSIS — Z510 Encounter for antineoplastic radiation therapy: Secondary | ICD-10-CM | POA: Diagnosis not present

## 2017-08-12 ENCOUNTER — Inpatient Hospital Stay: Payer: Medicare Other

## 2017-08-12 ENCOUNTER — Encounter: Payer: Self-pay | Admitting: Oncology

## 2017-08-12 ENCOUNTER — Other Ambulatory Visit: Payer: Self-pay

## 2017-08-12 ENCOUNTER — Inpatient Hospital Stay (HOSPITAL_BASED_OUTPATIENT_CLINIC_OR_DEPARTMENT_OTHER): Payer: Medicare Other | Admitting: Oncology

## 2017-08-12 ENCOUNTER — Ambulatory Visit
Admission: RE | Admit: 2017-08-12 | Discharge: 2017-08-12 | Disposition: A | Discharge status: Home / Self Care | Payer: Medicare Other | Source: Ambulatory Visit | Attending: Radiation Oncology | Admitting: Radiation Oncology

## 2017-08-12 VITALS — BP 131/78 | HR 72 | Temp 95.6°F | Resp 18 | Wt 184.7 lb

## 2017-08-12 DIAGNOSIS — D509 Iron deficiency anemia, unspecified: Secondary | ICD-10-CM | POA: Diagnosis not present

## 2017-08-12 DIAGNOSIS — I714 Abdominal aortic aneurysm, without rupture: Secondary | ICD-10-CM | POA: Diagnosis not present

## 2017-08-12 DIAGNOSIS — M7989 Other specified soft tissue disorders: Secondary | ICD-10-CM

## 2017-08-12 DIAGNOSIS — C3482 Malignant neoplasm of overlapping sites of left bronchus and lung: Secondary | ICD-10-CM

## 2017-08-12 DIAGNOSIS — Z8546 Personal history of malignant neoplasm of prostate: Secondary | ICD-10-CM

## 2017-08-12 DIAGNOSIS — Z79899 Other long term (current) drug therapy: Secondary | ICD-10-CM

## 2017-08-12 DIAGNOSIS — Z8701 Personal history of pneumonia (recurrent): Secondary | ICD-10-CM

## 2017-08-12 DIAGNOSIS — I129 Hypertensive chronic kidney disease with stage 1 through stage 4 chronic kidney disease, or unspecified chronic kidney disease: Secondary | ICD-10-CM

## 2017-08-12 DIAGNOSIS — Z923 Personal history of irradiation: Secondary | ICD-10-CM

## 2017-08-12 DIAGNOSIS — Z5111 Encounter for antineoplastic chemotherapy: Secondary | ICD-10-CM

## 2017-08-12 DIAGNOSIS — C3492 Malignant neoplasm of unspecified part of left bronchus or lung: Secondary | ICD-10-CM

## 2017-08-12 DIAGNOSIS — I700 Atherosclerosis of aorta: Secondary | ICD-10-CM | POA: Diagnosis not present

## 2017-08-12 DIAGNOSIS — J439 Emphysema, unspecified: Secondary | ICD-10-CM

## 2017-08-12 DIAGNOSIS — J9690 Respiratory failure, unspecified, unspecified whether with hypoxia or hypercapnia: Secondary | ICD-10-CM

## 2017-08-12 DIAGNOSIS — R600 Localized edema: Secondary | ICD-10-CM

## 2017-08-12 DIAGNOSIS — K219 Gastro-esophageal reflux disease without esophagitis: Secondary | ICD-10-CM

## 2017-08-12 DIAGNOSIS — N189 Chronic kidney disease, unspecified: Secondary | ICD-10-CM | POA: Diagnosis not present

## 2017-08-12 DIAGNOSIS — Z87891 Personal history of nicotine dependence: Secondary | ICD-10-CM

## 2017-08-12 DIAGNOSIS — Z510 Encounter for antineoplastic radiation therapy: Secondary | ICD-10-CM | POA: Diagnosis not present

## 2017-08-12 LAB — COMPREHENSIVE METABOLIC PANEL
ALT: 27 U/L (ref 17–63)
ANION GAP: 10 (ref 5–15)
AST: 21 U/L (ref 15–41)
Albumin: 3.7 g/dL (ref 3.5–5.0)
Alkaline Phosphatase: 59 U/L (ref 38–126)
BUN: 41 mg/dL — ABNORMAL HIGH (ref 6–20)
CALCIUM: 8 mg/dL — AB (ref 8.9–10.3)
CHLORIDE: 105 mmol/L (ref 101–111)
CO2: 23 mmol/L (ref 22–32)
Creatinine, Ser: 2.19 mg/dL — ABNORMAL HIGH (ref 0.61–1.24)
GFR calc non Af Amer: 28 mL/min — ABNORMAL LOW (ref >60)
GFR, EST AFRICAN AMERICAN: 32 mL/min — AB (ref >60)
Glucose, Bld: 156 mg/dL — ABNORMAL HIGH (ref 65–99)
Potassium: 3.7 mmol/L (ref 3.5–5.1)
Sodium: 138 mmol/L (ref 135–145)
Total Bilirubin: 0.6 mg/dL (ref 0.3–1.2)
Total Protein: 6.4 g/dL — ABNORMAL LOW (ref 6.5–8.1)

## 2017-08-12 LAB — CBC WITH DIFFERENTIAL/PLATELET
BASOS ABS: 0 10*3/uL (ref 0–0.1)
BASOS PCT: 0 %
EOS ABS: 0 10*3/uL (ref 0–0.7)
EOS PCT: 1 %
HCT: 30.3 % — ABNORMAL LOW (ref 40.0–52.0)
Hemoglobin: 10 g/dL — ABNORMAL LOW (ref 13.0–18.0)
Lymphocytes Relative: 11 %
Lymphs Abs: 0.3 10*3/uL — ABNORMAL LOW (ref 1.0–3.6)
MCH: 25.2 pg — AB (ref 26.0–34.0)
MCHC: 32.9 g/dL (ref 32.0–36.0)
MCV: 76.6 fL — ABNORMAL LOW (ref 80.0–100.0)
MONO ABS: 0.2 10*3/uL (ref 0.2–1.0)
Monocytes Relative: 7 %
Neutro Abs: 2.3 10*3/uL (ref 1.4–6.5)
Neutrophils Relative %: 81 %
PLATELETS: 162 10*3/uL (ref 150–440)
RBC: 3.96 MIL/uL — ABNORMAL LOW (ref 4.40–5.90)
RDW: 22.4 % — ABNORMAL HIGH (ref 11.5–14.5)
WBC: 2.9 10*3/uL — ABNORMAL LOW (ref 3.8–10.6)
nRBC: 2 /100 WBC — ABNORMAL HIGH

## 2017-08-12 MED ORDER — PALONOSETRON HCL INJECTION 0.25 MG/5ML
0.2500 mg | Freq: Once | INTRAVENOUS | Status: AC
  Administered 2017-08-12: 0.25 mg via INTRAVENOUS
  Filled 2017-08-12: qty 5

## 2017-08-12 MED ORDER — PREDNISONE 20 MG PO TABS: 20.0000 mg | ORAL_TABLET | Freq: Every day | ORAL | 0 refills | Status: AC

## 2017-08-12 MED ORDER — FAMOTIDINE IN NACL 20-0.9 MG/50ML-% IV SOLN
20.0000 mg | Freq: Once | INTRAVENOUS | Status: AC
Start: 2017-08-12 — End: 2017-08-12
  Administered 2017-08-12: 20 mg via INTRAVENOUS

## 2017-08-12 MED ORDER — SODIUM CHLORIDE 0.9 % IV SOLN
110.0000 mg | Freq: Once | INTRAVENOUS | Status: AC
  Administered 2017-08-12: 110 mg via INTRAVENOUS
  Filled 2017-08-12: qty 11

## 2017-08-12 MED ORDER — SODIUM CHLORIDE 0.9% FLUSH
10.0000 mL | Freq: Once | INTRAVENOUS | Status: AC
  Administered 2017-08-12: 10 mL via INTRAVENOUS
  Filled 2017-08-12: qty 10

## 2017-08-12 MED ORDER — DIPHENHYDRAMINE HCL 50 MG/ML IJ SOLN
50.0000 mg | Freq: Once | INTRAMUSCULAR | Status: AC
  Administered 2017-08-12: 50 mg via INTRAVENOUS
  Filled 2017-08-12: qty 1

## 2017-08-12 MED ORDER — DEXAMETHASONE SODIUM PHOSPHATE 100 MG/10ML IJ SOLN
20.0000 mg | Freq: Once | INTRAMUSCULAR | Status: AC
  Administered 2017-08-12: 20 mg via INTRAVENOUS
  Filled 2017-08-12: qty 2

## 2017-08-12 MED ORDER — SODIUM CHLORIDE 0.9 % IV SOLN
Freq: Once | INTRAVENOUS | Status: AC
  Administered 2017-08-12: 12:00:00 via INTRAVENOUS
  Filled 2017-08-12: qty 1000

## 2017-08-12 MED ORDER — HEPARIN SOD (PORK) LOCK FLUSH 100 UNIT/ML IV SOLN
500.0000 [IU] | Freq: Once | INTRAVENOUS | Status: AC
  Administered 2017-08-12: 500 [IU] via INTRAVENOUS
  Filled 2017-08-12: qty 5

## 2017-08-12 MED ORDER — SODIUM CHLORIDE 0.9 % IV SOLN
45.0000 mg/m2 | Freq: Once | INTRAVENOUS | Status: AC
  Administered 2017-08-12: 96 mg via INTRAVENOUS
  Filled 2017-08-12: qty 16

## 2017-08-12 NOTE — Progress Notes (Signed)
Patient here today for follow up. No concerns voiced.  °

## 2017-08-12 NOTE — Progress Notes (Signed)
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   HISTORY OF PRESENTING ILLNESS:  Billy Ortiz is a  77 y.o.  male with PMH listed below who was referred to me for evaluation of lung cancer.  #Follows up with pulmonologist Dr. Raul Del.  He was found to have lung nodules as well as mediastinal lymphadenopathy. Image work up # CT chest 04/25/17; New left retro hilar central lung mass versus adenopathy with resulting mass effect on the left lower lobe bronchus, worrisome for bronchogenic carcinoma. Subcarinal adenopathy has also progressed. Evaluation is complicated by the lack of intravenous contrast and the presence of underlying calcified mediastinal adenopathy consistent with previous granulomatous disease. Management options include bronchoscopy for tissue sampling and PET-CT. 2. No highly suspicious peripheral pulmonary nodules. There is a new perifissural nodule along the superior aspect of the right major fissure, measuring 5 mm. 3. Underlying moderate centrilobular emphysema. 4. Interval median sternotomy. Extensive aortic Atherosclerosis (ICD10-I70.0). 5. These results will be called to the ordering clinician or representative by the Radiologist Assistant, and communication documented in the PACS or zVision Dashboard.  05/10/2017 PET scan  1. Intense FDG uptake associated with the central left perihilar lung mass. Associated subcarinal and right paratracheal hypermetabolic lymph nodes identified. No evidence for distant metastatic disease. Assuming a non-small cell histology  imaging findings on today's study suggest T4N3M0 lesion or stage IIIB. 2.  Aortic Atherosclerosis (ICD10-I70.0). 3. 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 Recommend followup by ultrasound in 3 years. This recommendation follows ACR consensus guidelines: White Paper of the ACR Incidental Findings Committee II on Vascular Findings. J Am Coll Radiol 2013; 10:789-794. Aortic aneurysm NOS (ICD10-I71.9). 4.  Emphysema (ICD10-J43.9).  # 06/03/2017  Status post EBUS bronchoscopy which reviewed lesion endobronchially in the left mainstem, with spread to the left hilar node/mass as well as subcarinal node. Pathology revealed: A. Left main stem bronchus, DIAGNOSIS:  A. LEFT MAINSTEM BRONCHUS; FORCEPS BIOPSY: - SQUAMOUS CELL CARCINOMA.  # In April and May 2019, he was hospitalized twice due to hemoptysis, acute COPD exacerbation, and acute on chronic CKD, lower extremity edema   INTERVAL HISTORY Billy Ortiz is a 77 y.o. male who has above history reviewed by me today presents for assessment prior to chemotherapy management for stage IIIc squamous lung cancer. He has been on concurrent chemo and radiation with carboplatin and Taxol weekly.  He reports feeling well.   Fatigue: Chronic, stable. Shortness of breath, chronic, stable.  He only uses home oxygen if needed. Lower extremity swelling: Due to chronic kidney disease, stable. Cough: improved.   Review of Systems  Constitutional: Positive for malaise/fatigue. Negative for chills, fever and weight loss.  HENT: Negative for congestion, ear discharge, ear pain, nosebleeds, sinus pain and sore throat.   Eyes: Negative for double vision, photophobia, pain, discharge and redness.  Respiratory: Positive for sputum production and shortness of breath. Negative for cough, hemoptysis and wheezing.   Cardiovascular: Negative for chest pain, palpitations, orthopnea, claudication and leg swelling.  Gastrointestinal: Negative for abdominal pain,  blood in stool, constipation, diarrhea, heartburn, melena, nausea and vomiting.  Genitourinary: Negative for dysuria, flank pain, frequency, hematuria and urgency.  Musculoskeletal: Negative for back pain,  myalgias and neck pain.  Skin: Negative for itching and rash.  Neurological: Negative for dizziness, tingling, tremors, sensory change, focal weakness, weakness and headaches.  Endo/Heme/Allergies: Negative for environmental allergies. Does not bruise/bleed easily.  Psychiatric/Behavioral: Negative for depression, hallucinations, substance abuse and suicidal ideas. The patient is not nervous/anxious.     MEDICAL HISTORY:  Past Medical History:  Diagnosis Date  . Cancer [facility])    prostate  . Cataract   . Elevated lipids   . GERD (gastroesophageal reflux disease)   . HOH (hard of hearing)   . Hypercalcemia 07/21/2017  . Hypertension   . Pneumonia   . Pulmonary nodules/lesions, multiple   . Shortness of breath dyspnea     SURGICAL HISTORY: Past Surgical History:  Procedure Laterality Date  . BACK SURGERY     discectomy ,reports right foot drop since surgery   . CARDIAC CATHETERIZATION     2 stents  . CATARACT EXTRACTION W/PHACO Left 04/22/2015   Procedure: CATARACT EXTRACTION PHACO AND INTRAOCULAR LENS PLACEMENT (IOC);  Surgeon: Birder Robson, MD;  Location: [facility] ORS;  Service: Ophthalmology;  Laterality: Left;  [place]     1:17.8 AP%   25.7 CDE    20.03 fluid pack lot # [id number]  . COLONOSCOPY  2009  . COLONOSCOPY WITH PROPOFOL N/A 03/24/2016   Procedure: COLONOSCOPY WITH PROPOFOL;  Surgeon: Christene Lye, MD;  Location: [facility] ENDOSCOPY;  Service: Endoscopy;  Laterality: N/A;  . CORONARY ARTERY BYPASS GRAFT     single  . ENDOBRONCHIAL ULTRASOUND N/A 06/03/2017   Procedure: ENDOBRONCHIAL ULTRASOUND;  Surgeon: [name], MD;  Location: [facility] ORS;  Service: Pulmonary;  Laterality: N/A;  . ESOPHAGOGASTRODUODENOSCOPY (EGD) WITH PROPOFOL N/A 03/24/2016   Procedure:  ESOPHAGOGASTRODUODENOSCOPY (EGD) WITH PROPOFOL;  Surgeon: Christene Lye, MD;  Location: [facility] ENDOSCOPY;  Service: Endoscopy;  Laterality: N/A;  . EYE SURGERY     bilateral  . LEFT HEART CATH AND CORONARY ANGIOGRAPHY Left 05/26/2016   Procedure: Left Heart Cath and Coronary Angiography;  Surgeon: Corey Skains, MD;  Location: [facility];  Service: Cardiovascular;  Laterality: Left;  . PORTA CATH INSERTION N/A 06/22/2017   Procedure: PORTA CATH INSERTION;  Surgeon: Algernon Huxley, MD;  Location: [facility];  Service: Cardiovascular;  Laterality: N/A;    SOCIAL HISTORY: Social History   Socioeconomic History  . Marital status: Single    Spouse name: Not on file  . Number of children: Not on file  . Years of education: Not on file  . Highest education level: Not on file  Occupational History  . Not on file  Social Needs  . Financial resource strain: Not hard at all  . Food insecurity:    Worry: Never true    Inability: Never true  . Transportation needs:    Medical: No    Non-medical: No  Tobacco Use  . Smoking status: Former Smoker    Packs/day: 1.00    Years: 60.00    Pack years: 60.00    Types: Cigarettes    Last attempt to quit: 07/02/2017    Years since quitting: 0.1  . Smokeless tobacco: Never Used  Substance and Sexual Activity  . Alcohol use: No  . Drug use: No  . Sexual activity: Not on file  Lifestyle  . Physical activity:    Days per week: 0 days    Minutes per session: 0 min  . Stress: Not at all  Relationships  . Social connections:  Talks on phone: Once a week    Gets together: Once a week    Attends religious service: 1 to 4 times per year    Active member of club or organization: No    Attends meetings of clubs or organizations: Never    Relationship status: Never married  . Intimate partner violence:    Fear of current or ex partner: No    Emotionally abused: No    Physically abused: No    Forced sexual activity: No    Other Topics Concern  . Not on file  Social History Narrative   Lives with sister    FAMILY HISTORY: Family History  Problem Relation Age of Onset  . Pancreatic cancer Brother   . Pancreatic cancer Paternal Aunt     ALLERGIES:  is allergic to tramadol and lisinopril.  MEDICATIONS:  Current Outpatient Medications  Medication Sig Dispense Refill  . albuterol (PROVENTIL HFA;VENTOLIN HFA) 108 (90 Base) MCG/ACT inhaler Inhale 2 puffs into the lungs every 6 (six) hours as needed for wheezing or shortness of breath. 1 Inhaler 0  . amLODipine (NORVASC) 10 MG tablet Take 1 tablet (10 mg total) by mouth daily. 90 tablet 0  . atorvastatin (LIPITOR) 40 MG tablet Take 40 mg by mouth daily at 6 PM.     . benzonatate (TESSALON) 200 MG capsule Take 1 capsule (200 mg total) by mouth 3 (three) times daily as needed for cough. 60 capsule 0  . docusate sodium (COLACE) 100 MG capsule Take 100 mg by mouth daily.    . fluticasone furoate-vilanterol (BREO ELLIPTA) 100-25 MCG/INH AEPB Inhale 1 puff into the lungs daily.    . folic acid (FOLVITE) 1 MG tablet Take 1 tablet (1 mg total) by mouth daily. 30 tablet 0  . furosemide (LASIX) 80 MG tablet Take 1 tablet (80 mg total) by mouth 2 (two) times daily. 60 tablet 0  . gabapentin (NEURONTIN) 100 MG capsule Take 1 capsule (100 mg total) by mouth 2 (two) times daily. 60 capsule 0  . guaiFENesin (MUCINEX) 600 MG 12 hr tablet Take 1 tablet (600 mg total) by mouth 2 (two) times daily. 30 tablet 0  . hydrALAZINE (APRESOLINE) 50 MG tablet Take 1.5 tablets (75 mg total) by mouth every 8 (eight) hours. 90 tablet 0  . Hydrocortisone (GERHARDT'S BUTT CREAM) CREA Apply 1 application topically 4 (four) times daily. 1 each 0  . isosorbide mononitrate (IMDUR) 60 MG 24 hr tablet Take 60 mg by mouth daily.    Marland Kitchen lidocaine-prilocaine (EMLA) cream Apply to affected area once 30 g 3  . metoprolol succinate (TOPROL-XL) 50 MG 24 hr tablet Take 1 tablet (50 mg total) by mouth daily.  Take with or immediately following a meal. 90 tablet 0  . nitroGLYCERIN (NITROSTAT) 0.4 MG SL tablet Place 0.4 mg under the tongue every 5 (five) minutes as needed for chest pain.    Marland Kitchen omeprazole (PRILOSEC) 20 MG capsule Take 20 mg by mouth 2 (two) times daily before a meal.     . ondansetron (ZOFRAN) 8 MG tablet Take 1 tablet (8 mg total) by mouth 2 (two) times daily as needed for refractory nausea / vomiting. Start on day 3 after chemo. 30 tablet 1  . predniSONE (DELTASONE) 20 MG tablet Take 2 tablets (40 mg total) by mouth daily with breakfast. 10 tablet 0  . prochlorperazine (COMPAZINE) 10 MG tablet Take 1 tablet (10 mg total) by mouth every 6 (six) hours as needed (Nausea or  vomiting). 30 tablet 1  . vitamin B-12 (CYANOCOBALAMIN) 500 MCG tablet Take 1,000 mcg by mouth daily.     Marland Kitchen witch hazel-glycerin (TUCKS) pad Apply topically as needed for itching. 40 each 0   No current facility-administered medications for this visit.    Facility-Administered Medications Ordered in Other Visits  Medication Dose Route Frequency Provider Last Rate Last Dose  . ceFAZolin (ANCEF) IVPB 2g/100 mL premix  2 g Intravenous Once Stegmayer, Kimberly A, PA-C         PHYSICAL EXAMINATION: ECOG PERFORMANCE STATUS: 2 - Symptomatic, <50% confined to bed There were no vitals filed for this visit. There were no vitals filed for this visit.  Physical Exam  Constitutional: He is oriented to person, place, and time. He appears well-developed and well-nourished. No distress.  HENT:  Head: Normocephalic and atraumatic.  Right Ear: External ear normal.  Left Ear: External ear normal.  Mouth/Throat: Oropharynx is clear and moist.  Eyes: Pupils are equal, round, and reactive to light. Conjunctivae and EOM are normal. No scleral icterus.  Neck: Normal range of motion. Neck supple.  Cardiovascular: Normal rate, regular rhythm and normal heart sounds.  No murmur heard. Pulmonary/Chest: Effort normal and breath sounds  normal. No respiratory distress. He has no wheezes. He has no rales. He exhibits no tenderness.  Decreased breathsound bilaterally.   Abdominal: Soft. Bowel sounds are normal. He exhibits no distension and no mass. There is no tenderness.  Musculoskeletal: Normal range of motion. He exhibits edema. He exhibits no deformity.  Bilateral +2 pitting edema  Lymphadenopathy:    He has no cervical adenopathy.  Neurological: He is alert and oriented to person, place, and time. No cranial nerve deficit. Coordination normal.  Skin: Skin is warm and dry. No rash noted. No erythema.  Psychiatric: He has a normal mood and affect. His behavior is normal. Thought content normal.     LABORATORY DATA:  I have reviewed the data as listed Lab Results  Component Value Date   WBC 3.8 08/05/2017   HGB 10.1 (L) 08/05/2017   HCT 30.3 (L) 08/05/2017   MCV 75.1 (L) 08/05/2017   PLT 167 08/05/2017   Recent Labs    06/10/17 1111  06/21/17 0054  06/30/17 1831  07/22/17 0903 07/29/17 1034 08/05/17 0911  NA  --    < > 134*   < >  --    < > 136 138 136  K  --    < > 3.4*   < >  --    < > 3.6 3.7 3.7  CL  --    < > 104   < >  --    < > 99* 107 102  CO2  --    < > 24   < >  --    < > 27 22 25   GLUCOSE  --    < > 113*   < >  --    < > 84 143* 156*  BUN  --    < > 26*   < >  --    < > 68* 53* 51*  CREATININE  --    < > 2.91*   < >  --    < > 2.52* 2.10* 2.56*  CALCIUM  --    < > 8.0*   < >  --    < > 10.0 8.3* 8.9  GFRNONAA  --    < > 20*   < >  --    < >  23* 29* 23*  GFRAA  --    < > 23*   < >  --    < > 27* 34* 26*  PROT 6.8  --  6.7  6.9  --  6.2*   < > 6.8 6.5 6.6  ALBUMIN 3.3*  --  2.4*  2.6*   < > 2.7*   < > 3.8 3.8 3.8  AST 13*  --  15  15  --  28   < > 15 20 17   ALT 9*  --  8*  9*  --  38   < > 28 33 22  ALKPHOS 89  --  97  94  --  69   < > 52 53 53  BILITOT 1.1  --  0.9  0.9  --  0.6   < > 1.0 0.6 1.0  BILIDIR 0.2  --  0.2  --  <0.1*  --   --   --   --   IBILI 0.9  --  0.7  --  NOT  CALCULATED  --   --   --   --    < > = values in this interval not displayed.    MRI brain Jul 02, 2017 showed 1. No evidence for metastatic disease to the brain.  Small lesions may not be visualized without contrast however patient cannot receive contrast due to severe renal insufficiency.    ASSESSMENT & PLAN:  77yo Male present for evaluation for stage IIIC lung squamous carcinoma. 1. Squamous cell lung cancer, left ([place])   2. Encounter for antineoplastic chemotherapy   3. Lower extremity edema    #Lung cancer: Stable, tolerating weekly carboplatin and Taxol with concurrent radiation.  He has to complete 1 more week of radiation. Labs reviewed.  Counts are acceptable.  We will proceed today's weekly carboplatin and Taxol. He will follow-up in 1 week for assessment and final dose of chemotherapy.  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  Stable, continue monitor  # Respiratory failure/COPD/emphysema: Nasal cannula oxygen at home as needed.  Continue COPD inhalers. # CKD/LE edema: follows up with nephrology. Continue lasix.  Creatinine stable, at 2.56. # Hypercalcemia: sp Xgeva on 07/22/2017.  Hypercalcemia resolved calcium improved to 8.9.  Continue monitor   All questions were answered. The patient knows to call the clinic with any problems questions or concerns.  Addendum: discussed with Dr.Chrystal. Patient will have one week break and will have additional Radiation treatments. Will arrange patient to continue concurrent chemotherapy along with his additional RT.   Return of visit: 08/29/2017 or assessment prior to chemotherapy Total face to face encounter time for this patient visit was 51min. >50% of the time was  spent in counseling and coordination of care.     Earlie Server, MD, PhD Hematology Oncology [facility] Pager- [phone] 08/12/2017

## 2017-08-15 ENCOUNTER — Other Ambulatory Visit: Payer: Self-pay | Admitting: *Deleted

## 2017-08-15 ENCOUNTER — Ambulatory Visit
Admission: RE | Admit: 2017-08-15 | Discharge: 2017-08-15 | Disposition: A | Discharge status: Home / Self Care | Payer: Medicare Other | Source: Ambulatory Visit | Attending: Radiation Oncology | Admitting: Radiation Oncology

## 2017-08-15 DIAGNOSIS — Z510 Encounter for antineoplastic radiation therapy: Secondary | ICD-10-CM | POA: Diagnosis not present

## 2017-08-15 MED ORDER — SUCRALFATE 1 G PO TABS: 1.0000 g | ORAL_TABLET | Freq: Three times a day (TID) | ORAL | 3 refills | Status: AC

## 2017-08-19 ENCOUNTER — Encounter: Payer: Self-pay | Admitting: *Deleted

## 2017-08-22 ENCOUNTER — Other Ambulatory Visit: Payer: Self-pay

## 2017-08-22 ENCOUNTER — Encounter: Payer: Self-pay | Admitting: Radiation Oncology

## 2017-08-22 ENCOUNTER — Ambulatory Visit
Admission: RE | Admit: 2017-08-22 | Discharge: 2017-08-22 | Disposition: A | Discharge status: Home / Self Care | Payer: Medicare Other | Source: Ambulatory Visit | Attending: Radiation Oncology | Admitting: Radiation Oncology

## 2017-08-22 VITALS — BP 130/76 | HR 76 | Temp 98.2°F | Resp 20 | Wt 182.3 lb

## 2017-08-22 DIAGNOSIS — F17210 Nicotine dependence, cigarettes, uncomplicated: Secondary | ICD-10-CM | POA: Insufficient documentation

## 2017-08-22 DIAGNOSIS — R531 Weakness: Secondary | ICD-10-CM | POA: Insufficient documentation

## 2017-08-22 DIAGNOSIS — C3492 Malignant neoplasm of unspecified part of left bronchus or lung: Secondary | ICD-10-CM | POA: Diagnosis not present

## 2017-08-22 DIAGNOSIS — Z923 Personal history of irradiation: Secondary | ICD-10-CM | POA: Insufficient documentation

## 2017-08-22 NOTE — Progress Notes (Signed)
Radiation Oncology Follow up Note  Name: Billy Ortiz   Date:   08/22/2017 MRN:  [id number] DOB: [birth year]    This 77 y.o. 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 He is seen today in complaining of some tightness of the skin in his face not sure the etiology of that. He states he's feeling weak although he looks good. He specifically denies cough hemoptysis or chest tightness. He is having no dysphagia at this time.e  COMPLICATIONS OF TREATMENT: none  FOLLOW UP COMPLIANCE: keeps appointments   PHYSICAL EXAM:  BP 130/76   Pulse 76   Temp 98.2 F (36.8 C)   Resp 20   Wt 182 lb 5.1 oz (82.7 kg)   BMI 22.19 kg/m  Patient does have slurred significant bilateral lower extremity edemaWell-developed well-nourished patient in NAD. HEENT reveals PERLA, EOMI, discs not visualized.  Oral cavity is clear. No oral mucosal lesions are identified. Neck is clear without evidence of cervical or supraclavicular adenopathy. Lungs are clear to A&P. Cardiac examination is essentially unremarkable with regular rate and rhythm without murmur rub or thrill. Abdomen is benign with no organomegaly or masses noted. Motor sensory and DTR levels are equal and symmetric in the upper and lower extremities. Cranial nerves II through XII are grossly intact. Proprioception is intact. No peripheral adenopathy or edema is identified. No motor or sensory levels are noted. Crude visual fields are within normal range.  RADIOLOGY RESULTS: no current films for review  PLAN: at this time like to rescan the patient. Should we see good evidence of response to  treatment will offer small field lung boost.would plan on delivering other 2600 cGy in 13 fractions. Risks and benefits of further radiation therapy were explained to the patient in detail. He comp her hands my treatment plan well. Just his numerous medication inquiries to medical oncology.  I would like to take this opportunity to thank you for allowing me to participate in the care of your patient.Noreene Filbert, MD

## 2017-08-25 ENCOUNTER — Ambulatory Visit
Admission: RE | Admit: 2017-08-25 | Discharge: 2017-08-25 | Disposition: A | Discharge status: Home / Self Care | Payer: Medicare Other | Source: Ambulatory Visit | Attending: Radiation Oncology | Admitting: Radiation Oncology

## 2017-08-25 DIAGNOSIS — Z510 Encounter for antineoplastic radiation therapy: Secondary | ICD-10-CM | POA: Diagnosis not present

## 2017-08-26 DIAGNOSIS — Z510 Encounter for antineoplastic radiation therapy: Secondary | ICD-10-CM | POA: Diagnosis not present

## 2017-08-29 ENCOUNTER — Inpatient Hospital Stay: Payer: Medicare Other

## 2017-08-29 ENCOUNTER — Inpatient Hospital Stay: Payer: Medicare Other | Admitting: Oncology

## 2017-08-29 ENCOUNTER — Telehealth: Payer: Self-pay | Admitting: *Deleted

## 2017-08-29 NOTE — Telephone Encounter (Signed)
Son called to report that patient was found deceased 30-May-2022 when the family went to check on him. He will fax a copy of the death certificate to [place] when he gets it.  Per chart, a deputy called [facility] patient PCP, to report that patient deceased and it looked as though he hemorrhaged.

## 2017-08-29 DEATH — deceased

## 2017-09-06 ENCOUNTER — Ambulatory Visit: Payer: Medicare Other

## 2017-09-07 ENCOUNTER — Ambulatory Visit: Payer: Medicare Other

## 2017-09-08 ENCOUNTER — Ambulatory Visit: Payer: Medicare Other

## 2017-09-09 ENCOUNTER — Ambulatory Visit: Payer: Medicare Other

## 2017-09-12 ENCOUNTER — Ambulatory Visit: Payer: Medicare Other

## 2017-09-13 ENCOUNTER — Ambulatory Visit: Payer: Medicare Other

## 2017-09-14 ENCOUNTER — Ambulatory Visit: Payer: Medicare Other

## 2017-09-15 ENCOUNTER — Ambulatory Visit: Payer: Medicare Other

## 2017-09-16 ENCOUNTER — Ambulatory Visit: Payer: Medicare Other

## 2017-09-19 ENCOUNTER — Ambulatory Visit: Payer: Medicare Other

## 2017-09-20 ENCOUNTER — Ambulatory Visit: Payer: Medicare Other

## 2017-09-21 ENCOUNTER — Ambulatory Visit: Payer: Medicare Other

## 2017-09-22 ENCOUNTER — Ambulatory Visit: Payer: Medicare Other

## 2017-09-23 ENCOUNTER — Ambulatory Visit: Payer: Medicare Other

## 2019-12-10 IMAGING — PT NM PET TUM IMG INITIAL (PI) SKULL BASE T - THIGH
9 of 10 series · 19 of 25 positions shown · non-contrast
Comparison: CT chest 04/25/2017

CLINICAL DATA: Initial treatment strategy for lung mass.

EXAM:
NUCLEAR MEDICINE PET SKULL BASE TO THIGH
TECHNIQUE: 8.97 mCi F-18 FDG was injected intravenously. Full-ring PET imaging
was performed from the skull base to thigh after the radiotracer. CT
data was obtained and used for attenuation correction and anatomic
localization.
Fasting blood glucose: 47 mg/dl
Mediastinal blood pool activity: SUV max

[Series 3: ct wb 5.0 b30f · axial · 5.0mm · 0.98mm/px · z∈[-1396,-964]mm · 2 of 290 slices shown]
[im 1/290]
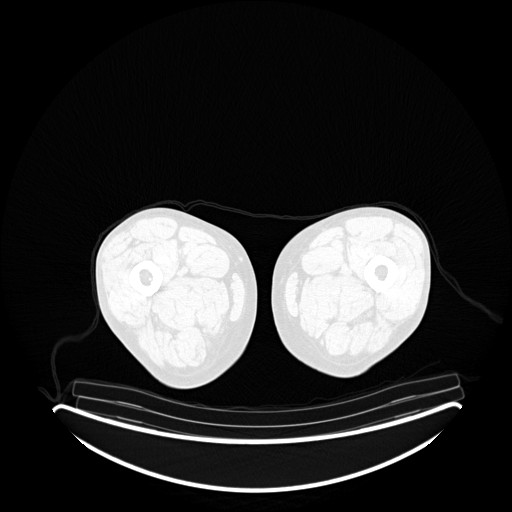
[im 145/290]
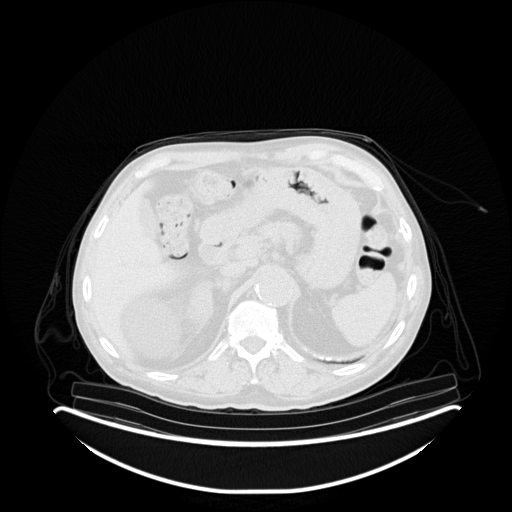

[Series 4: pet wb (ac) · axial · 5.0mm · 2.72mm/px · z∈[-1396,-528]mm · 3 of 290 slices shown]
[im 1/290]
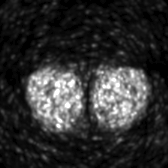
[im 145/290]
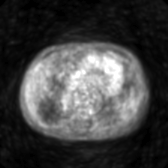
[im 290/290]
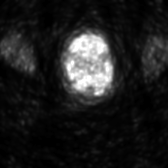

[Series 5: pet wb uncorrected (nac) · axial · 5.0mm · 4.07mm/px · z∈[-1108,-528]mm · 3 of 290 slices shown]
[im 97/290]
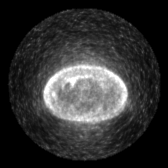
[im 193/290]
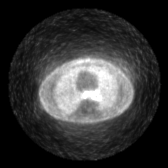
[im 290/290]
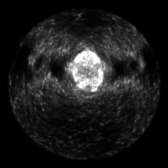

[Series 603: pet_ct axial fused · 3 of 290 slices shown]
[im 97/290]
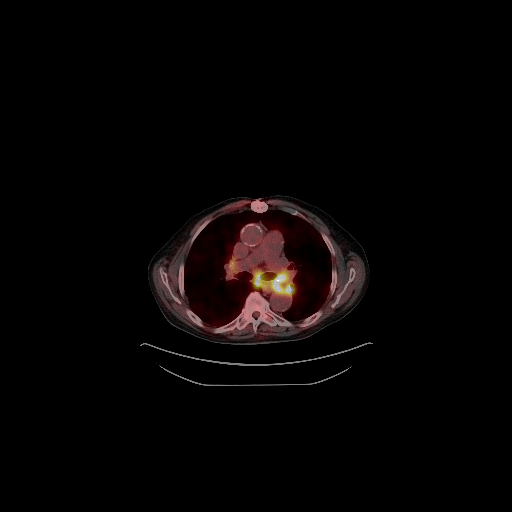
[im 193/290]
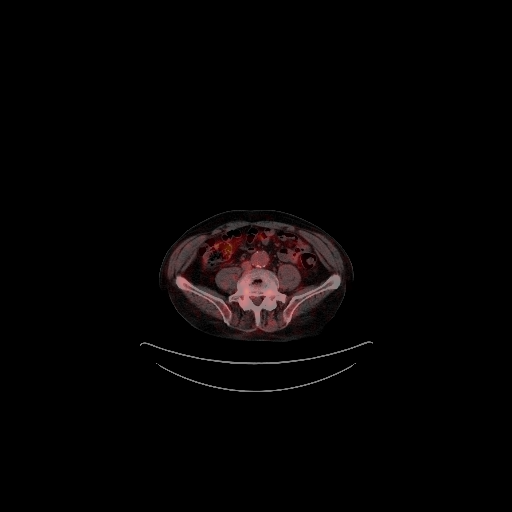
[im 290/290]
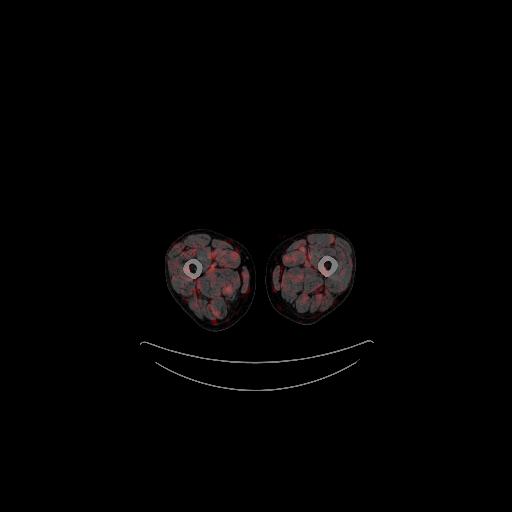

[Series 605: pet_ct sagittal fused · 2 of 155 slices shown]
[im 1/155]
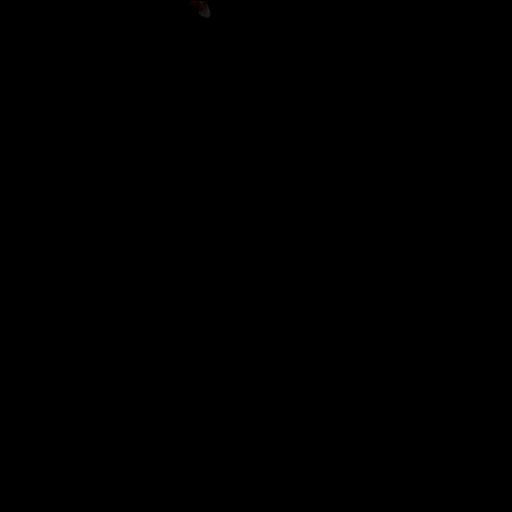
[im 155/155]
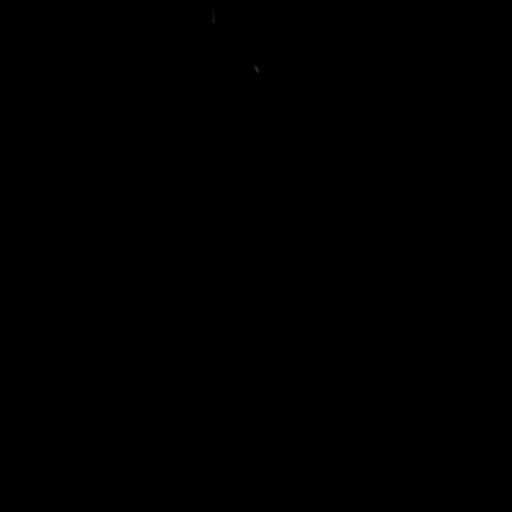

[Series 606: pet axial · 3 of 290 slices shown]
[im 1/290]
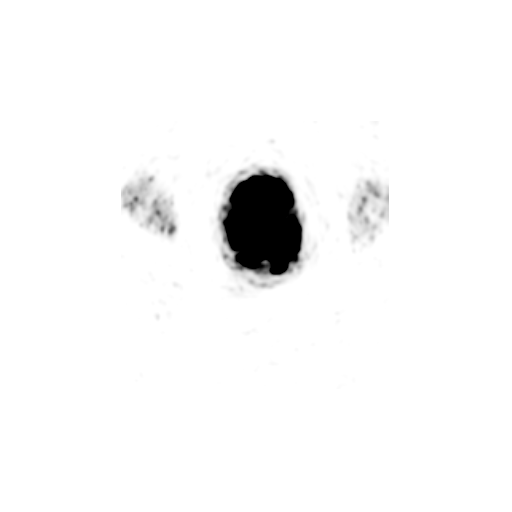
[im 193/290]
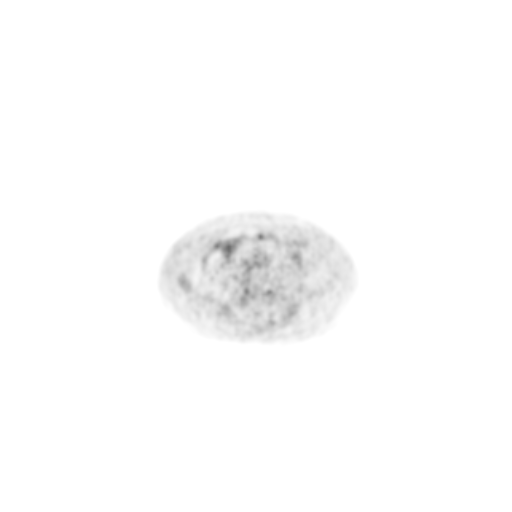
[im 290/290]
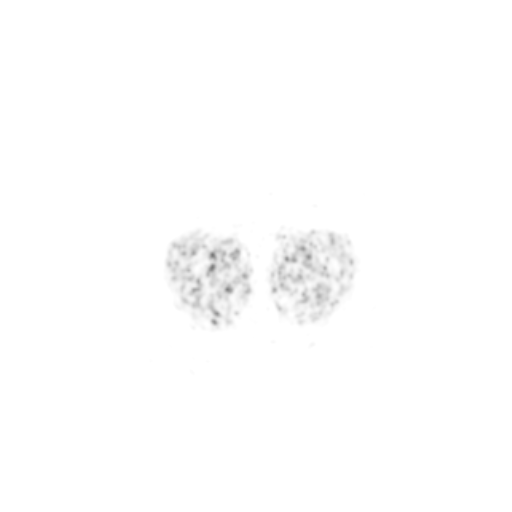

[Series 607: pet coronal · 1 of 115 slices shown]
[im 1/115]
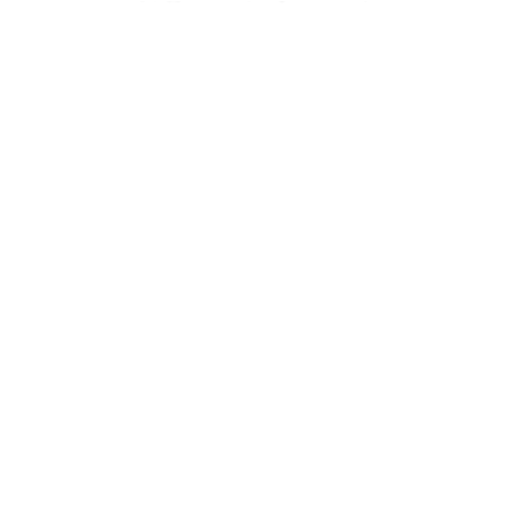

[Series 608: pet sagittal · 1 of 152 slices shown]
[im 152/152]
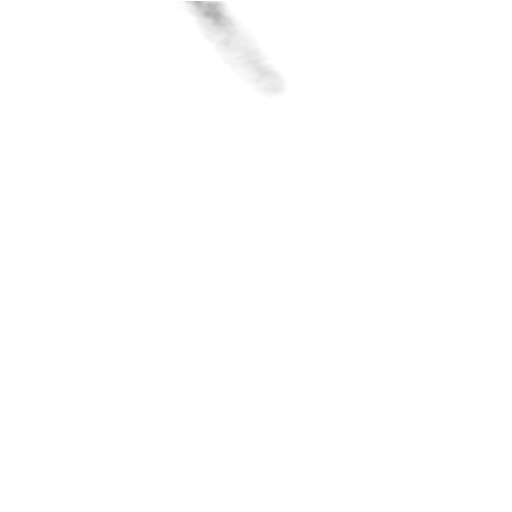

[Series 1060: results mm oncology reading · 1.0mm · 0.45mm/px · 1 of 4 slices shown]
[im 1/4]
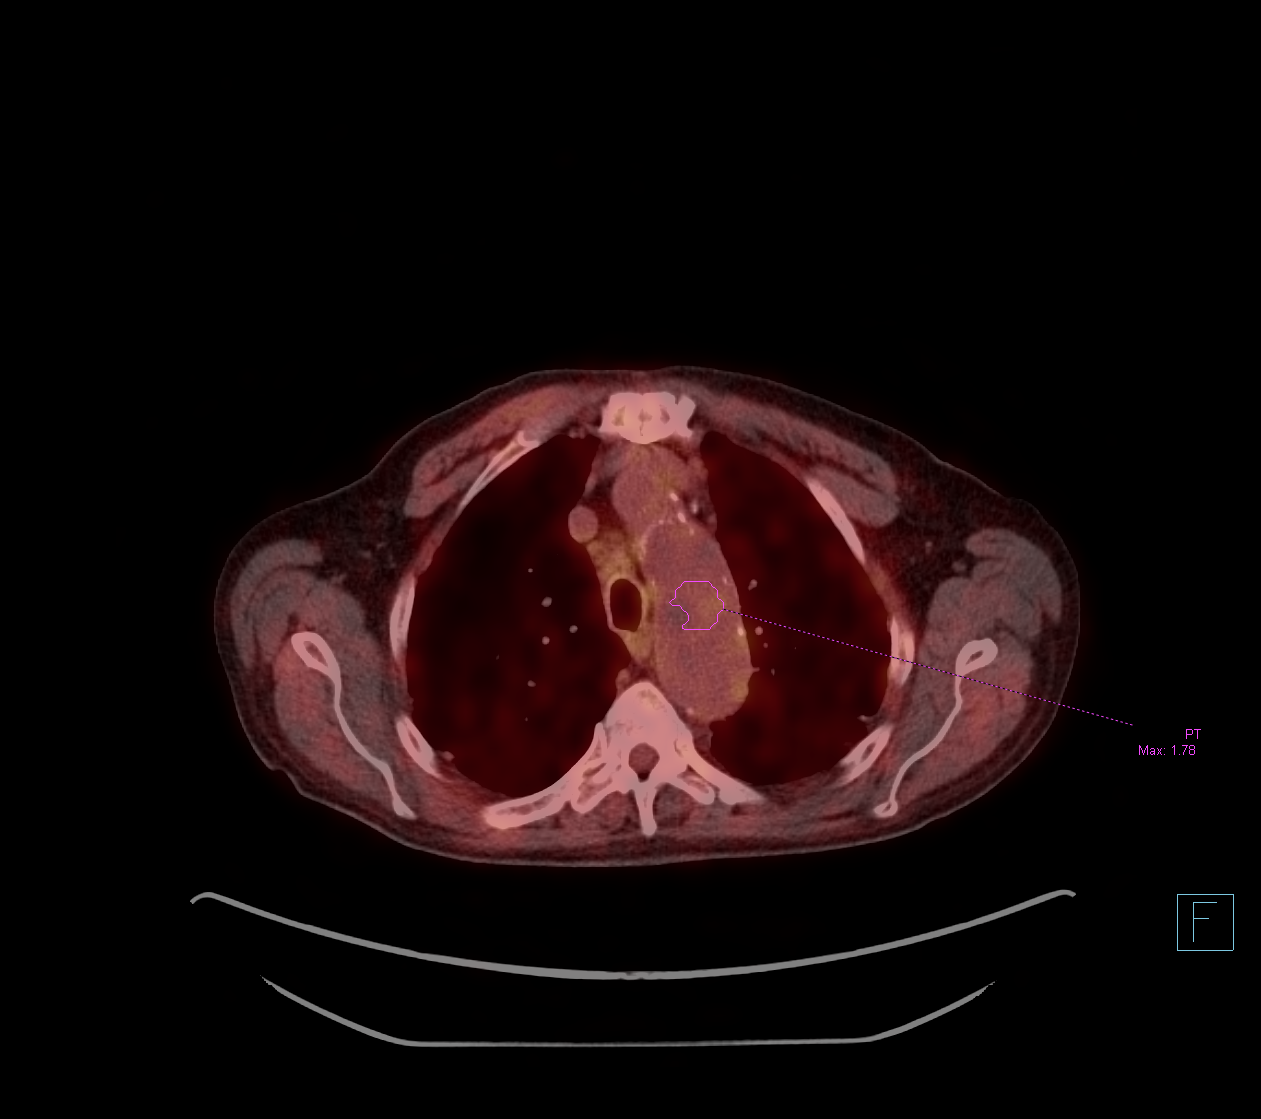

[19 of 25 positions shown; findings below may reference images not displayed]

FINDINGS: NECK: No hypermetabolic lymph nodes in the neck.

Incidental CT findings: none

CHEST: The central left perihilar lung mass encasing the left lower
lobe bronchus measures 4.36 cm and has an SUV max equal to 10.1 cm.
Direct mediastinal extension is identified between the left mainstem
bronchus and descending thoracic aorta. The lesion is approximately
3.8 cm from the carina.Multiple calcified and noncalcified
mediastinal lymph nodes are identified. Index subcarinal lymph node
measures 2.6 cm and has an SUV max equal to 7.34. Noncalcified right
paratracheal lymph node measures 9 mm and has an SUV max equal to
3.76.

No pleural effusion identified. Advanced changes of emphysema
identified in both lungs. No hypermetabolic pulmonary nodules
identified.

Incidental CT findings: Aortic atherosclerosis. No aggressive lytic
or sclerotic bone lesions.

ABDOMEN/PELVIS: No abnormal hypermetabolic activity within the
liver, pancreas, adrenal glands, or spleen. No hypermetabolic lymph
nodes in the abdomen or pelvis.

Incidental CT findings: Infrarenal abdominal aorta measures 3.2 cm
in maximum diameter. Aortic atherosclerosis noted. Bilateral kidney
cysts.

SKELETON: No focal hypermetabolic activity to suggest skeletal
metastasis.

Incidental CT findings: none
IMPRESSION: 1. Intense FDG uptake associated with the central left perihilar
lung mass. Associated subcarinal and right paratracheal
hypermetabolic lymph nodes identified. No evidence for distant
metastatic disease. Assuming a non-small cell histology imaging
findings on today's study suggest T427U0 lesion or stage IIIB.
2.  Aortic Atherosclerosis (PJF31-5MD.D).
3. Infrarenal abdominal aortic aneurysm measures 3.2 cm. Recommend
followup by ultrasound in 3 years. This recommendation follows ACR
consensus guidelines: White Paper of the ACR Incidental Findings
Committee II on Vascular Findings. [HOSPITAL] 2554;
[DATE]. Aortic aneurysm NOS (PJF31-THE.K).
4.  Emphysema (PJF31-5YT.0).

## 2020-01-20 IMAGING — CR DG CHEST 2V
2 series · 2 of 2 positions shown · non-contrast
Comparison: Prior CT 05/04/2017.  PET scan 05/10/2017.

CLINICAL DATA: Lung cancer, recent biopsy.  Weakness.  Hypotensive.

EXAM:
CHEST - 2 VIEW

[chest lat]
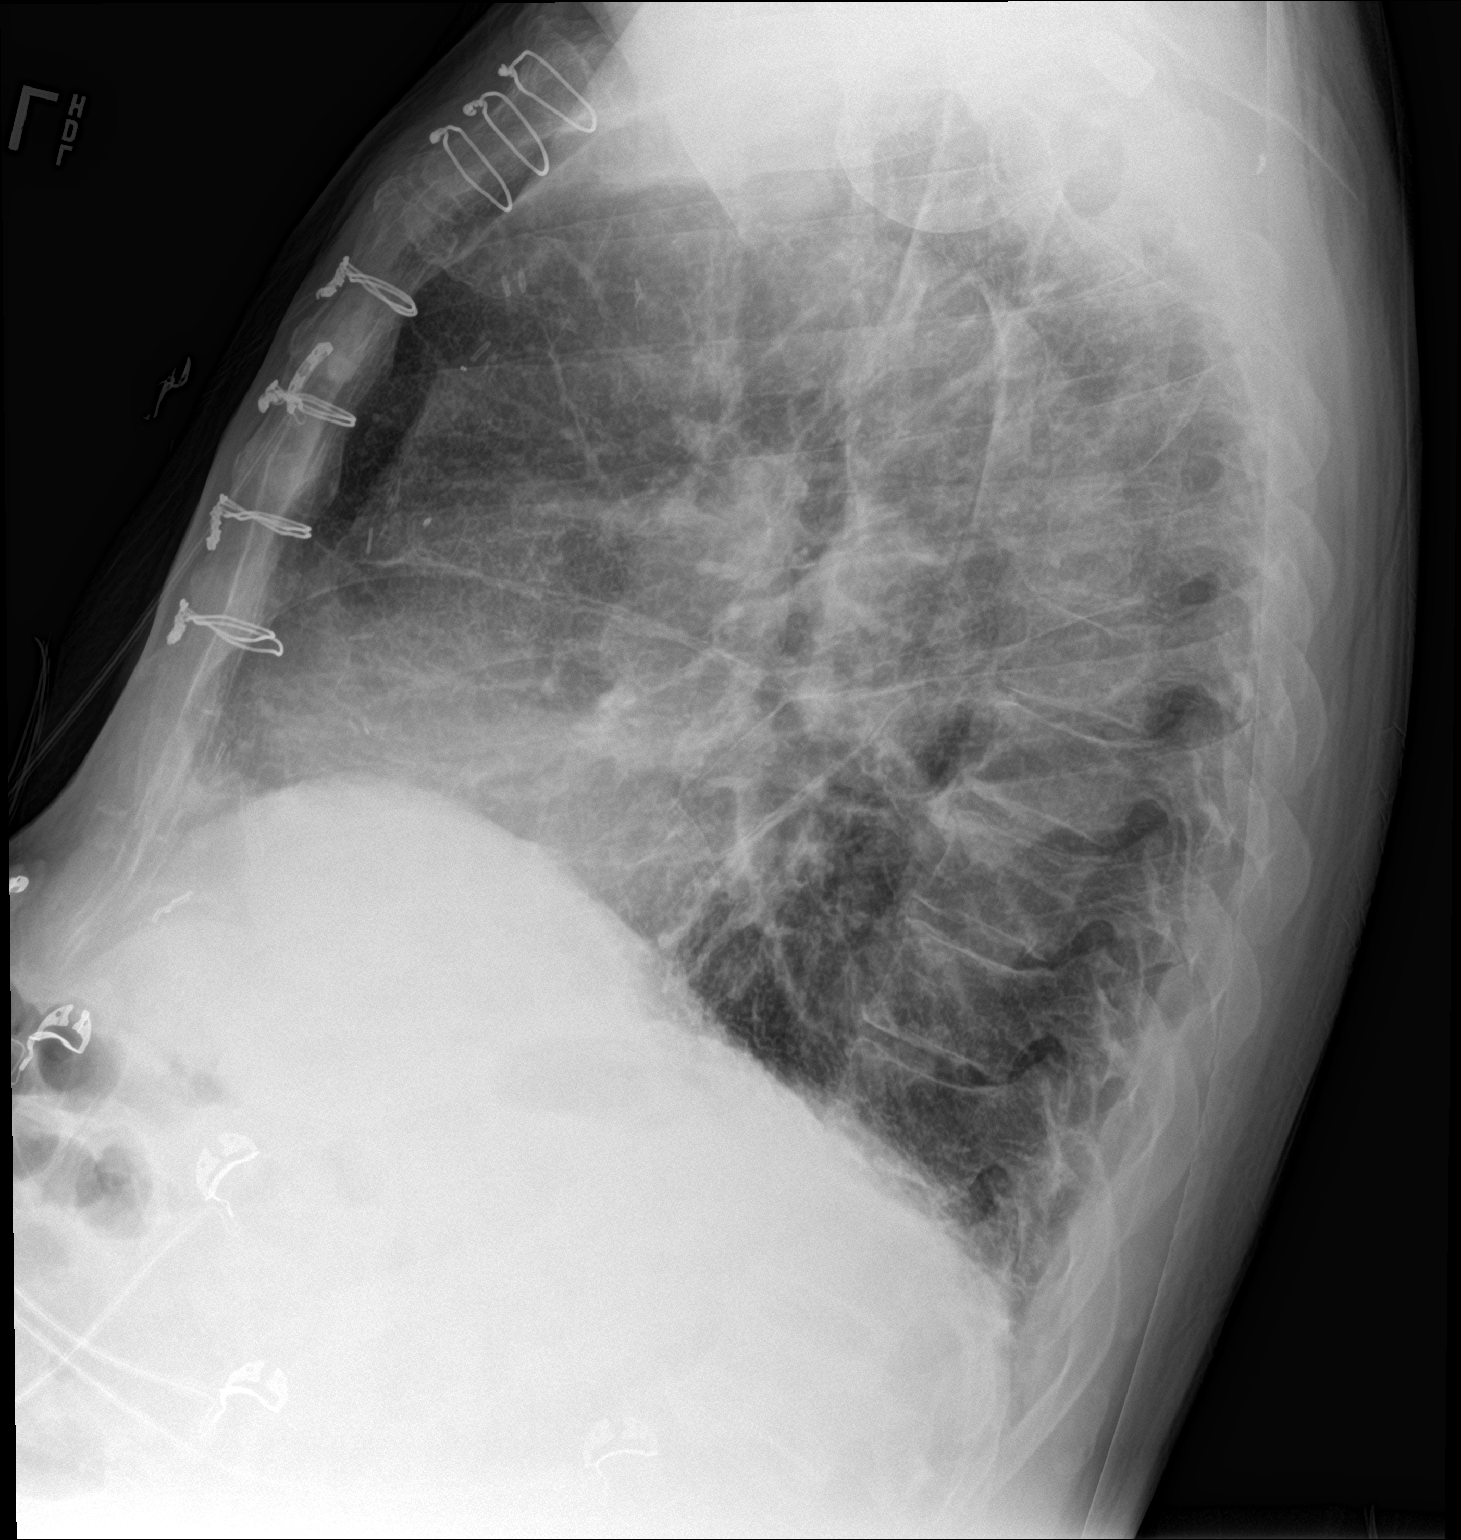

[chest ap]
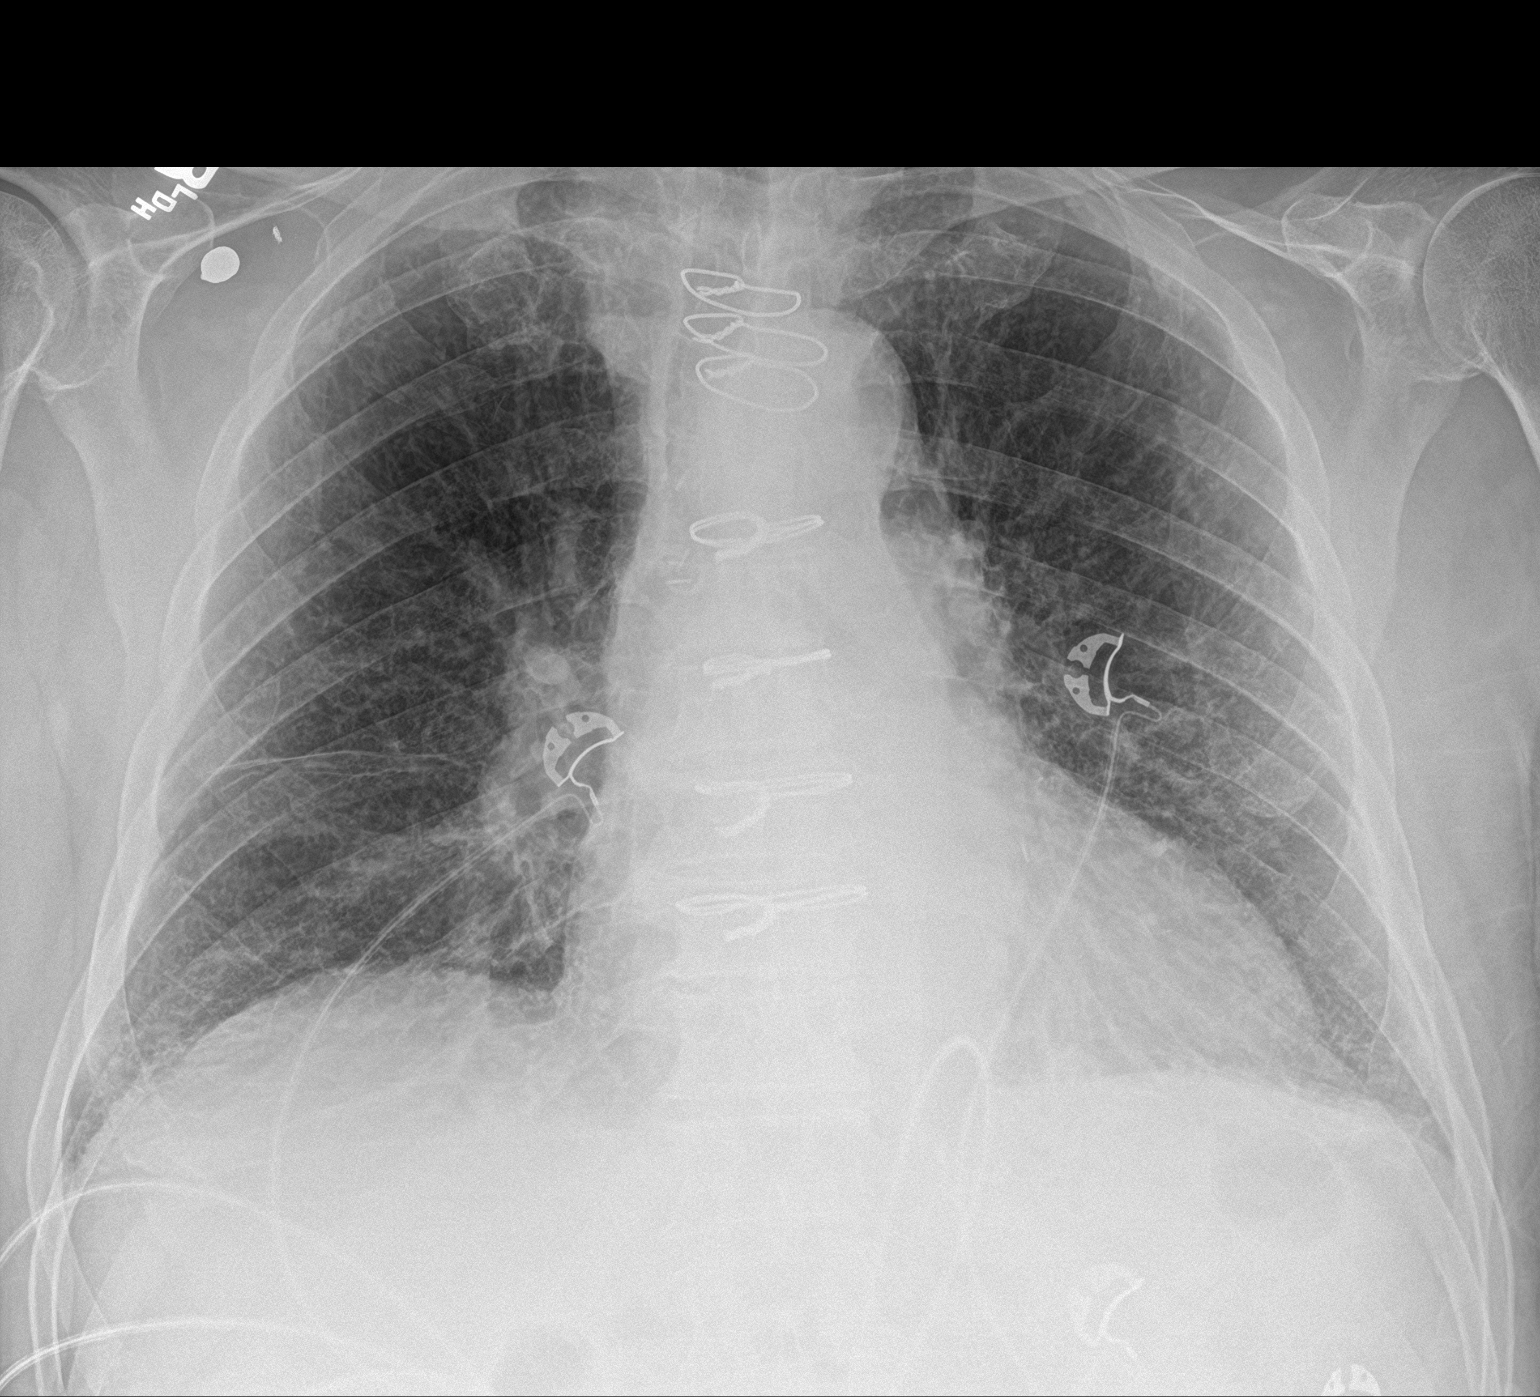

[2 of 2 positions shown; findings below may reference images not displayed]

FINDINGS: Cardiomegaly. Median sternotomy for CABG. Mild vascular congestion.
No consolidation or edema. LEFT perihilar lung lesion better seen on
CT. No acute osseous findings. There may be slight worsening
aeration compared with priors due to increasing vascular congestion.
The
IMPRESSION: Cardiomegaly. No consolidation or edema. There may be slight
worsening vascular congestion.
